# Patient Record
Sex: Female | Born: 1935 | Race: Black or African American | Hispanic: No | Marital: Single | Smoking: Former smoker
Health system: Southern US, Community
[De-identification: ages and names within clinical notes are randomized; demographics above are authoritative.]

## PROBLEM LIST (undated history)

## (undated) DIAGNOSIS — I10 Essential (primary) hypertension: Secondary | ICD-10-CM

## (undated) DIAGNOSIS — E785 Hyperlipidemia, unspecified: Secondary | ICD-10-CM

## (undated) DIAGNOSIS — R0602 Shortness of breath: Secondary | ICD-10-CM

## (undated) DIAGNOSIS — F329 Major depressive disorder, single episode, unspecified: Secondary | ICD-10-CM

## (undated) DIAGNOSIS — I8390 Asymptomatic varicose veins of unspecified lower extremity: Secondary | ICD-10-CM

## (undated) DIAGNOSIS — F419 Anxiety disorder, unspecified: Secondary | ICD-10-CM

## (undated) DIAGNOSIS — K219 Gastro-esophageal reflux disease without esophagitis: Secondary | ICD-10-CM

## (undated) DIAGNOSIS — F32A Depression, unspecified: Secondary | ICD-10-CM

## (undated) DIAGNOSIS — M1990 Unspecified osteoarthritis, unspecified site: Secondary | ICD-10-CM

## (undated) DIAGNOSIS — E119 Type 2 diabetes mellitus without complications: Secondary | ICD-10-CM

## (undated) HISTORY — PX: APPENDECTOMY: SHX54

## (undated) HISTORY — PX: EYE SURGERY: SHX253

## (undated) HISTORY — DX: Type 2 diabetes mellitus without complications: E11.9

## (undated) HISTORY — DX: Essential (primary) hypertension: I10

## (undated) HISTORY — DX: Hyperlipidemia, unspecified: E78.5

## (undated) HISTORY — DX: Asymptomatic varicose veins of unspecified lower extremity: I83.90

## (undated) HISTORY — DX: Shortness of breath: R06.02

---

## 1999-08-09 ENCOUNTER — Encounter: Admission: RE | Admit: 1999-08-09 | Discharge: 1999-08-09 | Payer: Self-pay | Admitting: Internal Medicine

## 1999-09-05 ENCOUNTER — Emergency Department (HOSPITAL_COMMUNITY): Admission: EM | Admit: 1999-09-05 | Discharge: 1999-09-05 | Payer: Self-pay | Admitting: Emergency Medicine

## 2000-05-02 ENCOUNTER — Other Ambulatory Visit: Admission: RE | Admit: 2000-05-02 | Discharge: 2000-05-02 | Payer: Self-pay | Admitting: Obstetrics and Gynecology

## 2004-09-16 ENCOUNTER — Ambulatory Visit: Payer: Self-pay | Admitting: Internal Medicine

## 2004-10-01 ENCOUNTER — Ambulatory Visit: Payer: Self-pay | Admitting: Internal Medicine

## 2004-10-11 ENCOUNTER — Ambulatory Visit: Payer: Self-pay | Admitting: Internal Medicine

## 2004-10-20 ENCOUNTER — Ambulatory Visit: Payer: Self-pay | Admitting: Internal Medicine

## 2004-11-12 ENCOUNTER — Ambulatory Visit: Payer: Self-pay | Admitting: Internal Medicine

## 2004-12-18 ENCOUNTER — Emergency Department (HOSPITAL_COMMUNITY): Admission: EM | Admit: 2004-12-18 | Discharge: 2004-12-18 | Payer: Self-pay | Admitting: Emergency Medicine

## 2005-01-11 ENCOUNTER — Ambulatory Visit: Payer: Self-pay | Admitting: Internal Medicine

## 2005-01-14 ENCOUNTER — Ambulatory Visit: Payer: Self-pay | Admitting: Cardiology

## 2005-02-09 ENCOUNTER — Ambulatory Visit: Payer: Self-pay | Admitting: Internal Medicine

## 2005-04-12 ENCOUNTER — Ambulatory Visit: Payer: Self-pay | Admitting: Internal Medicine

## 2005-04-27 ENCOUNTER — Ambulatory Visit: Payer: Self-pay | Admitting: Internal Medicine

## 2005-05-25 ENCOUNTER — Ambulatory Visit: Payer: Self-pay | Admitting: Internal Medicine

## 2005-06-23 ENCOUNTER — Ambulatory Visit: Payer: Self-pay | Admitting: Internal Medicine

## 2005-07-06 ENCOUNTER — Encounter: Admission: RE | Admit: 2005-07-06 | Discharge: 2005-07-06 | Payer: Self-pay | Admitting: Internal Medicine

## 2005-07-29 ENCOUNTER — Emergency Department (HOSPITAL_COMMUNITY): Admission: EM | Admit: 2005-07-29 | Discharge: 2005-07-29 | Payer: Self-pay | Admitting: Emergency Medicine

## 2005-08-02 ENCOUNTER — Ambulatory Visit: Payer: Self-pay | Admitting: Internal Medicine

## 2005-08-12 ENCOUNTER — Ambulatory Visit: Payer: Self-pay | Admitting: Internal Medicine

## 2005-08-17 ENCOUNTER — Ambulatory Visit: Payer: Self-pay

## 2005-08-31 ENCOUNTER — Ambulatory Visit: Payer: Self-pay | Admitting: Internal Medicine

## 2005-09-28 ENCOUNTER — Ambulatory Visit: Payer: Self-pay | Admitting: Pulmonary Disease

## 2005-12-13 ENCOUNTER — Ambulatory Visit: Payer: Self-pay | Admitting: Internal Medicine

## 2005-12-28 ENCOUNTER — Ambulatory Visit: Payer: Self-pay | Admitting: Internal Medicine

## 2006-03-09 ENCOUNTER — Ambulatory Visit: Payer: Self-pay | Admitting: Internal Medicine

## 2006-03-23 ENCOUNTER — Ambulatory Visit: Payer: Self-pay | Admitting: Internal Medicine

## 2006-04-20 ENCOUNTER — Ambulatory Visit: Payer: Self-pay | Admitting: Internal Medicine

## 2006-05-04 ENCOUNTER — Ambulatory Visit: Payer: Self-pay | Admitting: Internal Medicine

## 2006-05-05 ENCOUNTER — Ambulatory Visit: Payer: Self-pay | Admitting: Internal Medicine

## 2006-05-15 ENCOUNTER — Ambulatory Visit: Payer: Self-pay | Admitting: Pulmonary Disease

## 2006-05-16 ENCOUNTER — Ambulatory Visit: Payer: Self-pay | Admitting: Internal Medicine

## 2006-08-02 ENCOUNTER — Ambulatory Visit: Payer: Self-pay | Admitting: Internal Medicine

## 2006-08-02 LAB — CONVERTED CEMR LAB
ALT: 14 units/L (ref 0–40)
AST: 18 units/L (ref 0–37)
Albumin: 4 g/dL (ref 3.5–5.2)
Alkaline Phosphatase: 72 units/L (ref 39–117)
BUN: 16 mg/dL (ref 6–23)
Bacteria, U Microscopic: NEGATIVE /hpf
Basophils Absolute: 0 10*3/uL (ref 0.0–0.1)
Basophils Relative: 0.5 % (ref 0.0–1.0)
Bilirubin Urine: NEGATIVE
CO2: 29 meq/L (ref 19–32)
Calcium: 10 mg/dL (ref 8.4–10.5)
Chloride: 106 meq/L (ref 96–112)
Chol/HDL Ratio, serum: 4.6
Cholesterol: 231 mg/dL (ref 0–200)
Creatinine, Ser: 0.8 mg/dL (ref 0.4–1.2)
Crystals: NEGATIVE
Eosinophil percent: 2.4 % (ref 0.0–5.0)
GFR calc non Af Amer: 74 mL/min
Glomerular Filtration Rate, Af Am: 90 mL/min/{1.73_m2}
Glucose, Bld: 167 mg/dL — ABNORMAL HIGH (ref 70–99)
HCT: 42.3 % (ref 36.0–46.0)
HDL: 50.1 mg/dL (ref >39.0)
Hemoglobin, Urine: NEGATIVE
Hemoglobin: 13.9 g/dL (ref 12.0–15.0)
Ketones, ur: NEGATIVE mg/dL
LDL DIRECT: 161.2 mg/dL
Lymphocytes Relative: 36 % (ref 12.0–46.0)
MCHC: 32.7 g/dL (ref 30.0–36.0)
MCV: 86.2 fL (ref 78.0–100.0)
Monocytes Absolute: 0.5 10*3/uL (ref 0.2–0.7)
Monocytes Relative: 10.6 % (ref 3.0–11.0)
Mucus, UA: NEGATIVE
Neutro Abs: 2.7 10*3/uL (ref 1.4–7.7)
Neutrophils Relative %: 50.5 % (ref 43.0–77.0)
Nitrite: NEGATIVE
Platelets: 187 10*3/uL (ref 150–400)
Potassium: 5 meq/L (ref 3.5–5.1)
Pro B Natriuretic peptide (BNP): 31 pg/mL (ref 0.0–100.0)
RBC: 4.91 M/uL (ref 3.87–5.11)
RDW: 16.5 % — ABNORMAL HIGH (ref 11.5–14.6)
Sed Rate: 16 mm/hr (ref 0–25)
Sodium: 141 meq/L (ref 135–145)
Specific Gravity, Urine: 1.025 (ref 1.000–1.03)
TSH: 1.68 microintl units/mL (ref 0.35–5.50)
Total Bilirubin: 0.7 mg/dL (ref 0.3–1.2)
Total Protein, Urine: NEGATIVE mg/dL
Total Protein: 7.5 g/dL (ref 6.0–8.3)
Triglyceride fasting, serum: 88 mg/dL (ref 0–149)
Urine Glucose: NEGATIVE mg/dL
Urobilinogen, UA: 0.2 (ref 0.0–1.0)
VLDL: 18 mg/dL (ref 0–40)
WBC: 5.1 10*3/uL (ref 4.5–10.5)
pH: 5 (ref 5.0–8.0)

## 2006-08-16 ENCOUNTER — Ambulatory Visit: Payer: Self-pay | Admitting: Pulmonary Disease

## 2006-08-22 ENCOUNTER — Encounter: Admission: RE | Admit: 2006-08-22 | Discharge: 2006-11-20 | Payer: Self-pay | Admitting: Internal Medicine

## 2006-11-09 ENCOUNTER — Ambulatory Visit: Payer: Self-pay | Admitting: Internal Medicine

## 2006-11-09 LAB — CONVERTED CEMR LAB
BUN: 9 mg/dL (ref 6–23)
CO2: 28 meq/L (ref 19–32)
Calcium: 9.6 mg/dL (ref 8.4–10.5)
Chloride: 102 meq/L (ref 96–112)
GFR calc Af Amer: 105 mL/min
GFR calc non Af Amer: 87 mL/min

## 2007-02-13 ENCOUNTER — Ambulatory Visit: Payer: Self-pay | Admitting: Internal Medicine

## 2007-02-13 LAB — CONVERTED CEMR LAB
CO2: 27 meq/L (ref 19–32)
Chloride: 104 meq/L (ref 96–112)
Creatinine, Ser: 0.7 mg/dL (ref 0.4–1.2)
Eosinophils Relative: 3.8 % (ref 0.0–5.0)
Glucose, Bld: 172 mg/dL — ABNORMAL HIGH (ref 70–99)
HCT: 39.1 % (ref 36.0–46.0)
Hemoglobin: 13.4 g/dL (ref 12.0–15.0)
Neutrophils Relative %: 47.2 % (ref 43.0–77.0)
RBC: 4.59 M/uL (ref 3.87–5.11)
RDW: 13.9 % (ref 11.5–14.6)
Sodium: 139 meq/L (ref 135–145)
WBC: 5.9 10*3/uL (ref 4.5–10.5)

## 2007-02-27 ENCOUNTER — Ambulatory Visit: Payer: Self-pay | Admitting: Internal Medicine

## 2007-02-27 LAB — CONVERTED CEMR LAB: Hgb A1c MFr Bld: 10.4 % — ABNORMAL HIGH

## 2007-05-24 ENCOUNTER — Ambulatory Visit: Payer: Self-pay | Admitting: Internal Medicine

## 2007-05-24 LAB — CONVERTED CEMR LAB
CO2: 29 meq/L (ref 19–32)
Chloride: 105 meq/L (ref 96–112)
Creatinine, Ser: 0.8 mg/dL (ref 0.4–1.2)
Glucose, Bld: 163 mg/dL — ABNORMAL HIGH (ref 70–99)
Hgb A1c MFr Bld: 8.2 % — ABNORMAL HIGH (ref 4.6–6.0)
Sodium: 140 meq/L (ref 135–145)

## 2007-07-12 DIAGNOSIS — R05 Cough: Secondary | ICD-10-CM

## 2007-07-12 DIAGNOSIS — R059 Cough, unspecified: Secondary | ICD-10-CM | POA: Insufficient documentation

## 2007-07-12 DIAGNOSIS — M545 Low back pain: Secondary | ICD-10-CM

## 2007-07-12 DIAGNOSIS — F329 Major depressive disorder, single episode, unspecified: Secondary | ICD-10-CM

## 2007-07-12 DIAGNOSIS — R5381 Other malaise: Secondary | ICD-10-CM

## 2007-07-12 DIAGNOSIS — R5383 Other fatigue: Secondary | ICD-10-CM

## 2007-07-12 DIAGNOSIS — E785 Hyperlipidemia, unspecified: Secondary | ICD-10-CM | POA: Insufficient documentation

## 2007-07-12 DIAGNOSIS — L509 Urticaria, unspecified: Secondary | ICD-10-CM

## 2007-07-12 DIAGNOSIS — E1165 Type 2 diabetes mellitus with hyperglycemia: Secondary | ICD-10-CM

## 2007-07-12 DIAGNOSIS — I10 Essential (primary) hypertension: Secondary | ICD-10-CM

## 2007-08-09 ENCOUNTER — Ambulatory Visit: Payer: Self-pay | Admitting: Internal Medicine

## 2007-08-09 LAB — CONVERTED CEMR LAB
ALT: 16 units/L (ref 0–35)
AST: 17 units/L (ref 0–37)
Albumin: 4.1 g/dL (ref 3.5–5.2)
BUN: 10 mg/dL (ref 6–23)
Basophils Absolute: 0 10*3/uL (ref 0.0–0.1)
Calcium: 9.7 mg/dL (ref 8.4–10.5)
Chloride: 104 meq/L (ref 96–112)
Eosinophils Absolute: 0.3 10*3/uL (ref 0.0–0.6)
Eosinophils Relative: 4.5 % (ref 0.0–5.0)
GFR calc non Af Amer: 74 mL/min
Ketones, ur: NEGATIVE mg/dL
MCHC: 33.7 g/dL (ref 30.0–36.0)
MCV: 85.9 fL (ref 78.0–100.0)
Nitrite: NEGATIVE
Platelets: 181 10*3/uL (ref 150–400)
RBC: 4.67 M/uL (ref 3.87–5.11)
TSH: 2.1 microintl units/mL (ref 0.35–5.50)
Total CHOL/HDL Ratio: 5.4
Triglycerides: 178 mg/dL — ABNORMAL HIGH (ref 0–149)
Urine Glucose: NEGATIVE mg/dL
WBC: 6.2 10*3/uL (ref 4.5–10.5)

## 2007-08-10 ENCOUNTER — Telehealth: Payer: Self-pay | Admitting: Internal Medicine

## 2007-08-16 ENCOUNTER — Ambulatory Visit: Payer: Self-pay | Admitting: Internal Medicine

## 2007-09-05 ENCOUNTER — Telehealth (INDEPENDENT_AMBULATORY_CARE_PROVIDER_SITE_OTHER): Payer: Self-pay | Admitting: *Deleted

## 2007-09-07 ENCOUNTER — Ambulatory Visit: Payer: Self-pay | Admitting: Internal Medicine

## 2007-09-07 DIAGNOSIS — R42 Dizziness and giddiness: Secondary | ICD-10-CM

## 2007-09-07 DIAGNOSIS — J310 Chronic rhinitis: Secondary | ICD-10-CM | POA: Insufficient documentation

## 2007-09-10 LAB — CONVERTED CEMR LAB
ALT: 19 units/L (ref 0–35)
Albumin: 4.1 g/dL (ref 3.5–5.2)
Alkaline Phosphatase: 50 units/L (ref 39–117)
Cholesterol: 160 mg/dL (ref 0–200)
Creatinine, Ser: 0.8 mg/dL (ref 0.4–1.2)
HDL: 45.7 mg/dL (ref >39.0)
LDL Cholesterol: 96 mg/dL (ref 0–99)
Potassium: 4.8 meq/L (ref 3.5–5.1)
Total Bilirubin: 0.6 mg/dL (ref 0.3–1.2)
Total CHOL/HDL Ratio: 3.5

## 2007-09-13 ENCOUNTER — Telehealth (INDEPENDENT_AMBULATORY_CARE_PROVIDER_SITE_OTHER): Payer: Self-pay | Admitting: *Deleted

## 2007-09-28 ENCOUNTER — Ambulatory Visit: Payer: Self-pay | Admitting: Internal Medicine

## 2007-11-27 ENCOUNTER — Ambulatory Visit: Payer: Self-pay | Admitting: Internal Medicine

## 2007-11-28 LAB — CONVERTED CEMR LAB
Calcium: 10.1 mg/dL (ref 8.4–10.5)
Creatinine, Ser: 1 mg/dL (ref 0.4–1.2)
GFR calc non Af Amer: 57 mL/min
HDL: 37.8 mg/dL — ABNORMAL LOW (ref >39.0)
Hgb A1c MFr Bld: 8.6 % — ABNORMAL HIGH (ref 4.6–6.0)
LDL Cholesterol: 69 mg/dL (ref 0–99)
Sodium: 139 meq/L (ref 135–145)
Total Bilirubin: 0.4 mg/dL (ref 0.3–1.2)
Total CHOL/HDL Ratio: 3.3
Triglycerides: 98 mg/dL (ref 0–149)

## 2008-02-21 ENCOUNTER — Ambulatory Visit: Payer: Self-pay | Admitting: Internal Medicine

## 2008-02-22 LAB — CONVERTED CEMR LAB
ALT: 17 units/L (ref 0–35)
AST: 21 units/L (ref 0–37)
Alkaline Phosphatase: 50 units/L (ref 39–117)
Bilirubin, Direct: 0.1 mg/dL (ref 0.0–0.3)
CO2: 27 meq/L (ref 19–32)
Chloride: 105 meq/L (ref 96–112)
Potassium: 4.3 meq/L (ref 3.5–5.1)
Sodium: 140 meq/L (ref 135–145)
Total Bilirubin: 0.8 mg/dL (ref 0.3–1.2)

## 2008-03-19 ENCOUNTER — Encounter: Payer: Self-pay | Admitting: Internal Medicine

## 2008-03-21 ENCOUNTER — Ambulatory Visit: Payer: Self-pay | Admitting: Internal Medicine

## 2008-04-17 ENCOUNTER — Telehealth (INDEPENDENT_AMBULATORY_CARE_PROVIDER_SITE_OTHER): Payer: Self-pay | Admitting: *Deleted

## 2008-04-18 ENCOUNTER — Ambulatory Visit: Payer: Self-pay | Admitting: Internal Medicine

## 2008-04-18 DIAGNOSIS — N3000 Acute cystitis without hematuria: Secondary | ICD-10-CM | POA: Insufficient documentation

## 2008-05-02 ENCOUNTER — Ambulatory Visit: Payer: Self-pay | Admitting: Internal Medicine

## 2008-05-02 LAB — CONVERTED CEMR LAB
CO2: 28 meq/L (ref 19–32)
Calcium: 9.6 mg/dL (ref 8.4–10.5)
Creatinine, Ser: 1 mg/dL (ref 0.4–1.2)
Glucose, Bld: 124 mg/dL — ABNORMAL HIGH (ref 70–99)
Sodium: 141 meq/L (ref 135–145)

## 2008-05-13 ENCOUNTER — Ambulatory Visit: Payer: Self-pay | Admitting: Internal Medicine

## 2008-05-15 ENCOUNTER — Telehealth: Payer: Self-pay | Admitting: Internal Medicine

## 2008-05-26 ENCOUNTER — Encounter: Payer: Self-pay | Admitting: Internal Medicine

## 2008-05-27 ENCOUNTER — Ambulatory Visit: Payer: Self-pay | Admitting: Internal Medicine

## 2008-05-30 ENCOUNTER — Encounter: Payer: Self-pay | Admitting: Internal Medicine

## 2008-06-05 ENCOUNTER — Encounter: Payer: Self-pay | Admitting: Internal Medicine

## 2008-07-16 ENCOUNTER — Ambulatory Visit: Payer: Self-pay | Admitting: Internal Medicine

## 2008-07-16 ENCOUNTER — Telehealth (INDEPENDENT_AMBULATORY_CARE_PROVIDER_SITE_OTHER): Payer: Self-pay | Admitting: *Deleted

## 2008-07-16 LAB — CONVERTED CEMR LAB
Bilirubin Urine: NEGATIVE
Mucus, UA: NEGATIVE
Nitrite: POSITIVE — AB
Total Protein, Urine: 30 mg/dL — AB
pH: 5.5 (ref 5.0–8.0)

## 2008-08-28 ENCOUNTER — Ambulatory Visit: Payer: Self-pay | Admitting: Internal Medicine

## 2008-08-28 DIAGNOSIS — M2550 Pain in unspecified joint: Secondary | ICD-10-CM | POA: Insufficient documentation

## 2008-08-30 LAB — CONVERTED CEMR LAB
Albumin: 4.1 g/dL (ref 3.5–5.2)
Alkaline Phosphatase: 47 units/L (ref 39–117)
BUN: 22 mg/dL (ref 6–23)
Bacteria, UA: NEGATIVE
Basophils Absolute: 0 10*3/uL (ref 0.0–0.1)
Calcium: 9.9 mg/dL (ref 8.4–10.5)
Cholesterol: 182 mg/dL (ref 0–200)
Crystals: NEGATIVE
Eosinophils Absolute: 0.3 10*3/uL (ref 0.0–0.7)
GFR calc Af Amer: 69 mL/min
GFR calc non Af Amer: 57 mL/min
HDL: 48.5 mg/dL (ref >39.0)
Hemoglobin, Urine: NEGATIVE
Hgb A1c MFr Bld: 7.5 % — ABNORMAL HIGH (ref 4.6–6.0)
MCHC: 33.4 g/dL (ref 30.0–36.0)
MCV: 87.1 fL (ref 78.0–100.0)
Microalb Creat Ratio: 4.6 mg/g (ref 0.0–30.0)
Microalb, Ur: 0.6 mg/dL (ref 0.0–1.9)
Neutrophils Relative %: 44.3 % (ref 43.0–77.0)
Platelets: 175 10*3/uL (ref 150–400)
Potassium: 4.3 meq/L (ref 3.5–5.1)
RBC / HPF: NONE SEEN
RDW: 14.3 % (ref 11.5–14.6)
Sodium: 141 meq/L (ref 135–145)
Specific Gravity, Urine: 1.02 (ref 1.000–1.03)
Total Protein: 7.5 g/dL (ref 6.0–8.3)
Triglycerides: 111 mg/dL (ref 0–149)
Urobilinogen, UA: 0.2 (ref 0.0–1.0)
VLDL: 22 mg/dL (ref 0–40)

## 2008-09-10 ENCOUNTER — Encounter: Payer: Self-pay | Admitting: Internal Medicine

## 2008-09-15 ENCOUNTER — Telehealth (INDEPENDENT_AMBULATORY_CARE_PROVIDER_SITE_OTHER): Payer: Self-pay | Admitting: *Deleted

## 2008-09-15 ENCOUNTER — Ambulatory Visit: Payer: Self-pay | Admitting: Internal Medicine

## 2008-09-15 DIAGNOSIS — E559 Vitamin D deficiency, unspecified: Secondary | ICD-10-CM | POA: Insufficient documentation

## 2008-11-27 ENCOUNTER — Ambulatory Visit: Payer: Self-pay | Admitting: Internal Medicine

## 2008-11-28 LAB — CONVERTED CEMR LAB
CO2: 27 meq/L (ref 19–32)
Chloride: 103 meq/L (ref 96–112)
Potassium: 4.2 meq/L (ref 3.5–5.1)
Sodium: 138 meq/L (ref 135–145)
Total CHOL/HDL Ratio: 3
Triglycerides: 123 mg/dL (ref 0.0–149.0)

## 2009-01-06 ENCOUNTER — Ambulatory Visit: Payer: Self-pay | Admitting: Internal Medicine

## 2009-01-07 ENCOUNTER — Telehealth (INDEPENDENT_AMBULATORY_CARE_PROVIDER_SITE_OTHER): Payer: Self-pay | Admitting: *Deleted

## 2009-02-09 ENCOUNTER — Ambulatory Visit: Payer: Self-pay | Admitting: Internal Medicine

## 2009-02-10 DIAGNOSIS — J209 Acute bronchitis, unspecified: Secondary | ICD-10-CM

## 2009-02-27 ENCOUNTER — Ambulatory Visit: Payer: Self-pay | Admitting: Internal Medicine

## 2009-02-27 LAB — CONVERTED CEMR LAB
CO2: 28 meq/L (ref 19–32)
Chloride: 104 meq/L (ref 96–112)
Glucose, Bld: 153 mg/dL — ABNORMAL HIGH (ref 70–99)
HDL: 45.1 mg/dL (ref >39.00)
Hgb A1c MFr Bld: 8 % — ABNORMAL HIGH (ref 4.6–6.5)
Potassium: 3.8 meq/L (ref 3.5–5.1)
Sodium: 140 meq/L (ref 135–145)
Total CHOL/HDL Ratio: 3
Triglycerides: 99 mg/dL (ref 0.0–149.0)
VLDL: 19.8 mg/dL (ref 0.0–40.0)

## 2009-03-16 ENCOUNTER — Ambulatory Visit: Payer: Self-pay | Admitting: Internal Medicine

## 2009-03-16 DIAGNOSIS — K589 Irritable bowel syndrome without diarrhea: Secondary | ICD-10-CM | POA: Insufficient documentation

## 2009-04-20 ENCOUNTER — Ambulatory Visit: Payer: Self-pay | Admitting: Internal Medicine

## 2009-05-04 ENCOUNTER — Ambulatory Visit: Payer: Self-pay | Admitting: Internal Medicine

## 2009-06-02 ENCOUNTER — Ambulatory Visit: Payer: Self-pay | Admitting: Internal Medicine

## 2009-06-02 ENCOUNTER — Encounter: Payer: Self-pay | Admitting: Adult Health

## 2009-06-04 LAB — CONVERTED CEMR LAB
GFR calc non Af Amer: 68.88 mL/min (ref >60)
Glucose, Bld: 135 mg/dL — ABNORMAL HIGH (ref 70–99)

## 2009-07-09 ENCOUNTER — Encounter: Payer: Self-pay | Admitting: Internal Medicine

## 2009-08-18 ENCOUNTER — Ambulatory Visit: Payer: Self-pay | Admitting: Internal Medicine

## 2009-08-21 LAB — CONVERTED CEMR LAB
AST: 24 units/L (ref 0–37)
Albumin: 4.3 g/dL (ref 3.5–5.2)
Alkaline Phosphatase: 47 units/L (ref 39–117)
Basophils Relative: 0.8 % (ref 0.0–3.0)
Calcium: 10 mg/dL (ref 8.4–10.5)
Chloride: 105 meq/L (ref 96–112)
Cholesterol: 150 mg/dL (ref 0–200)
Creatinine, Ser: 1 mg/dL (ref 0.4–1.2)
Eosinophils Relative: 6.7 % — ABNORMAL HIGH (ref 0.0–5.0)
HDL: 47.9 mg/dL (ref >39.00)
LDL Cholesterol: 82 mg/dL (ref 0–99)
Lymphocytes Relative: 31.4 % (ref 12.0–46.0)
Neutrophils Relative %: 49.4 % (ref 43.0–77.0)
RBC: 4.5 M/uL (ref 3.87–5.11)
Total Protein: 7.8 g/dL (ref 6.0–8.3)
Triglycerides: 100 mg/dL (ref 0.0–149.0)
WBC: 6.5 10*3/uL (ref 4.5–10.5)

## 2009-10-12 ENCOUNTER — Telehealth (INDEPENDENT_AMBULATORY_CARE_PROVIDER_SITE_OTHER): Payer: Self-pay | Admitting: *Deleted

## 2009-10-12 ENCOUNTER — Ambulatory Visit: Payer: Self-pay | Admitting: Internal Medicine

## 2009-10-12 ENCOUNTER — Encounter: Payer: Self-pay | Admitting: Adult Health

## 2009-10-14 LAB — CONVERTED CEMR LAB
Specific Gravity, Urine: 1.01 (ref 1.000–1.030)
Urine Glucose: NEGATIVE mg/dL

## 2009-11-18 ENCOUNTER — Ambulatory Visit: Payer: Self-pay | Admitting: Internal Medicine

## 2009-11-18 LAB — CONVERTED CEMR LAB
ALT: 15 units/L (ref 0–35)
Albumin: 4.1 g/dL (ref 3.5–5.2)
BUN: 20 mg/dL (ref 6–23)
Basophils Relative: 0.8 % (ref 0.0–3.0)
CRP, High Sensitivity: 0.7 (ref 0.00–5.00)
Chloride: 106 meq/L (ref 96–112)
Cholesterol: 151 mg/dL (ref 0–200)
Eosinophils Relative: 4.5 % (ref 0.0–5.0)
Glucose, Bld: 128 mg/dL — ABNORMAL HIGH (ref 70–99)
HCT: 39.5 % (ref 36.0–46.0)
Ketones, ur: NEGATIVE mg/dL
LDL Cholesterol: 76 mg/dL (ref 0–99)
Lymphs Abs: 1.7 10*3/uL (ref 0.7–4.0)
MCV: 88.3 fL (ref 78.0–100.0)
Monocytes Absolute: 0.7 10*3/uL (ref 0.1–1.0)
Neutro Abs: 3.5 10*3/uL (ref 1.4–7.7)
Platelets: 144 10*3/uL — ABNORMAL LOW (ref 150.0–400.0)
Potassium: 3.8 meq/L (ref 3.5–5.1)
Specific Gravity, Urine: >=1.03 (ref 1.000–1.030)
TSH: 2.42 microintl units/mL (ref 0.35–5.50)
Total Bilirubin: 0.2 mg/dL — ABNORMAL LOW (ref 0.3–1.2)
Total Protein, Urine: NEGATIVE mg/dL
Total Protein: 7.3 g/dL (ref 6.0–8.3)
Urine Glucose: NEGATIVE mg/dL
WBC: 6.2 10*3/uL (ref 4.5–10.5)
pH: 5 (ref 5.0–8.0)

## 2009-11-19 LAB — CONVERTED CEMR LAB: Vit D, 25-Hydroxy: 40 ng/mL (ref 30–89)

## 2010-01-25 ENCOUNTER — Telehealth: Payer: Self-pay | Admitting: Adult Health

## 2010-01-25 ENCOUNTER — Ambulatory Visit: Payer: Self-pay | Admitting: Adult Health

## 2010-01-25 ENCOUNTER — Ambulatory Visit: Payer: Self-pay | Admitting: Internal Medicine

## 2010-01-25 DIAGNOSIS — R300 Dysuria: Secondary | ICD-10-CM | POA: Insufficient documentation

## 2010-01-25 LAB — CONVERTED CEMR LAB
Specific Gravity, Urine: 1.02 (ref 1.000–1.030)
Urobilinogen, UA: 0.2 (ref 0.0–1.0)

## 2010-01-26 ENCOUNTER — Encounter: Payer: Self-pay | Admitting: Adult Health

## 2010-02-17 ENCOUNTER — Ambulatory Visit: Payer: Self-pay | Admitting: Internal Medicine

## 2010-02-17 LAB — CONVERTED CEMR LAB
Albumin: 4.5 g/dL (ref 3.5–5.2)
Alkaline Phosphatase: 51 units/L (ref 39–117)
Bilirubin, Direct: 0.1 mg/dL (ref 0.0–0.3)
CO2: 30 meq/L (ref 19–32)
Chloride: 104 meq/L (ref 96–112)
Creatinine, Ser: 1 mg/dL (ref 0.4–1.2)
Hgb A1c MFr Bld: 7.7 % — ABNORMAL HIGH (ref 4.6–6.5)
LDL Cholesterol: 115 mg/dL — ABNORMAL HIGH (ref 0–99)
Potassium: 4.6 meq/L (ref 3.5–5.1)
Total CHOL/HDL Ratio: 4
Total Protein: 7.3 g/dL (ref 6.0–8.3)
Triglycerides: 132 mg/dL (ref 0.0–149.0)
VLDL: 26.4 mg/dL (ref 0.0–40.0)

## 2010-04-06 ENCOUNTER — Encounter: Payer: Self-pay | Admitting: Internal Medicine

## 2010-05-20 ENCOUNTER — Ambulatory Visit: Payer: Self-pay | Admitting: Internal Medicine

## 2010-05-20 LAB — CONVERTED CEMR LAB
CO2: 31 meq/L (ref 19–32)
Calcium: 10.2 mg/dL (ref 8.4–10.5)
Chloride: 105 meq/L (ref 96–112)
Potassium: 4.3 meq/L (ref 3.5–5.1)
Sodium: 141 meq/L (ref 135–145)

## 2010-06-25 IMAGING — CR DG CHEST 2V
2 series · 2 of 2 positions shown · non-contrast
Comparison: Chest x-ray of 04/20/2009

CLINICAL DATA: For complete physical exam, former smoker

CHEST - 2 VIEW

[view not recorded (1 of 2)]
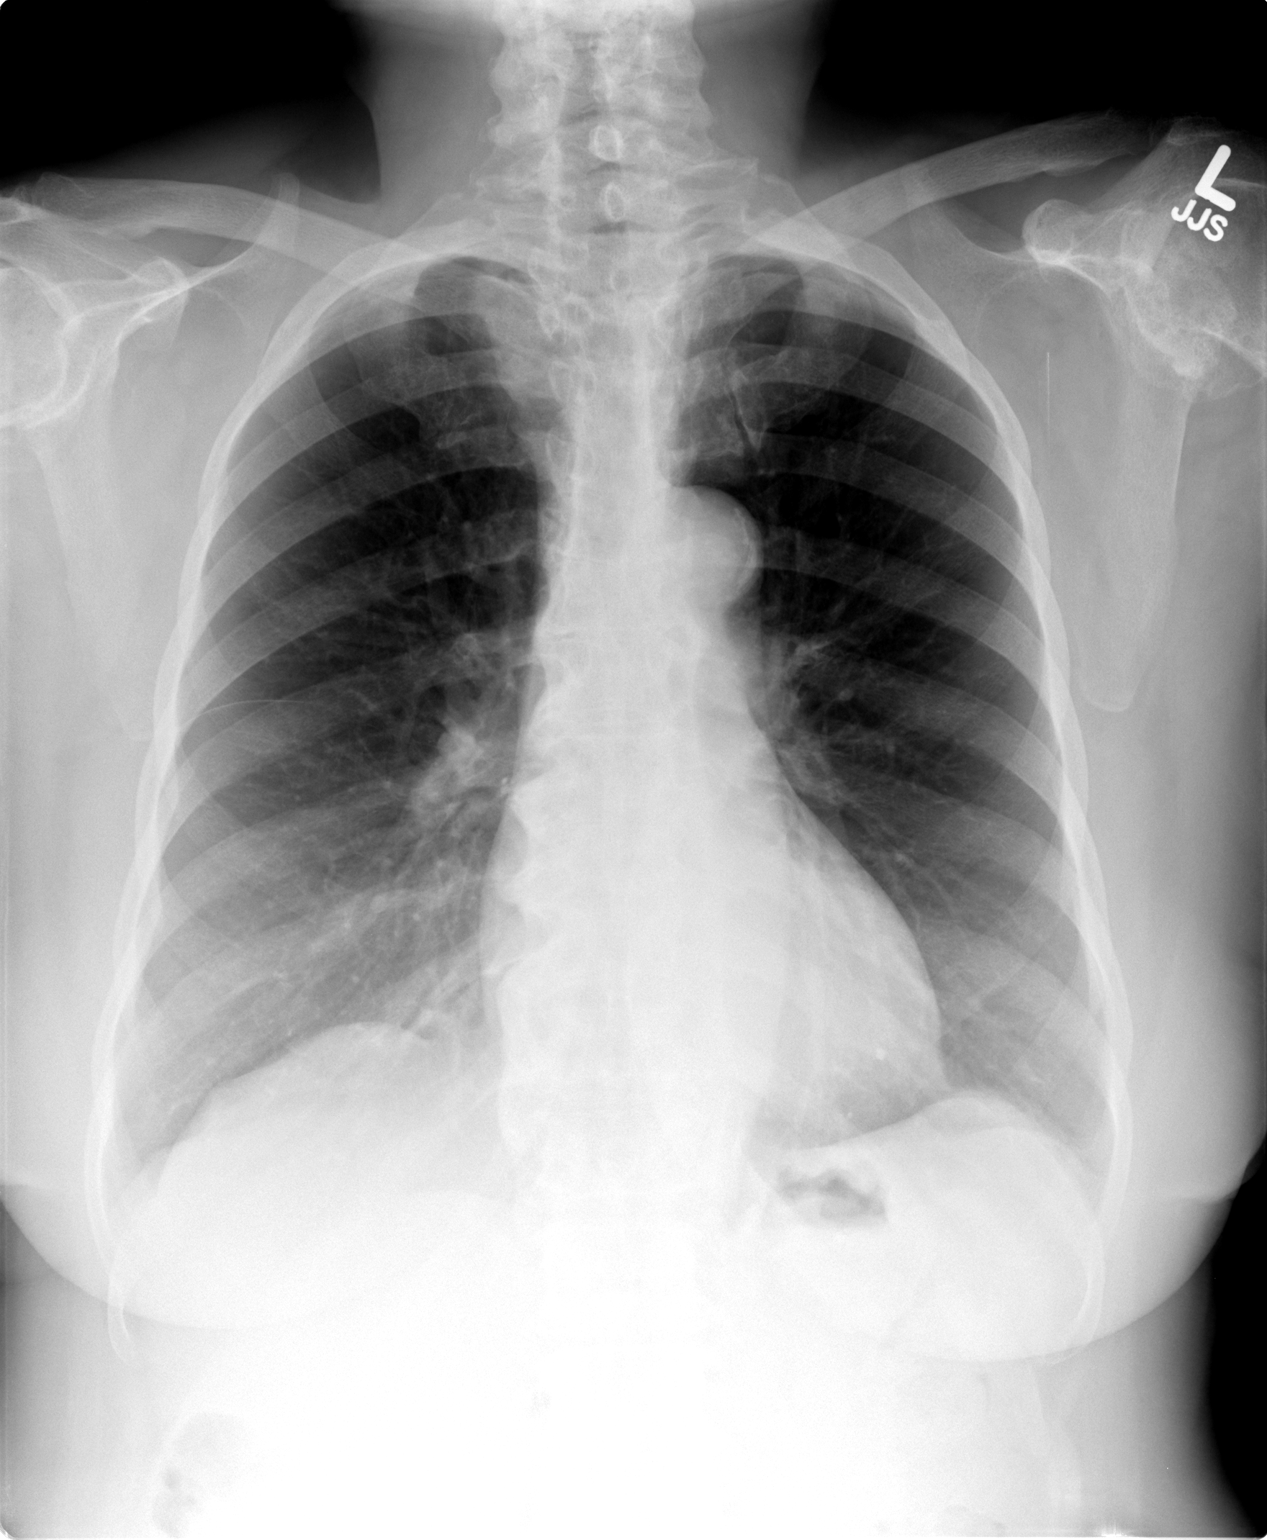

[view not recorded (2 of 2)]
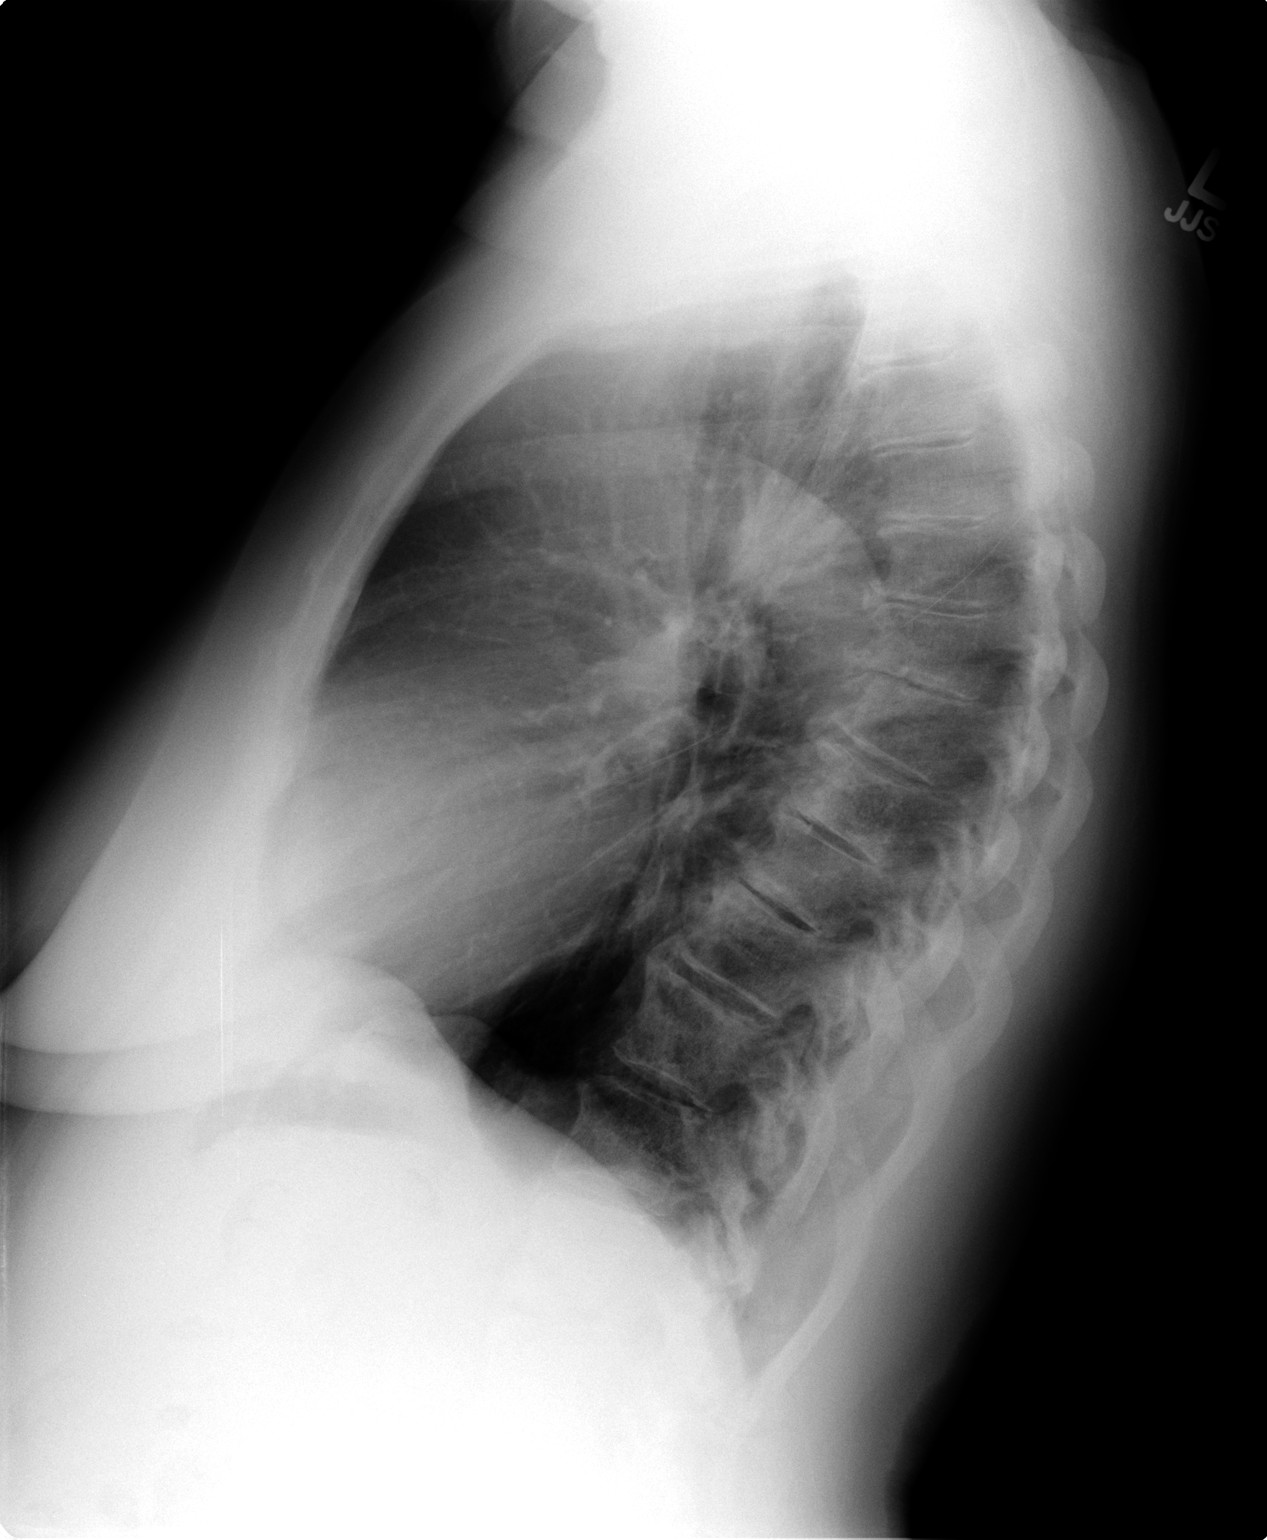

[2 of 2 positions shown; findings below may reference images not displayed]

FINDINGS: The lungs are clear.  Mediastinal contours are stable.
The heart is within normal limits in size.  There are degenerative
changes diffusely throughout the thoracic spine.
IMPRESSION: Stable chest x-ray.  No active lung disease.

## 2010-07-13 ENCOUNTER — Ambulatory Visit: Payer: Self-pay | Admitting: Internal Medicine

## 2010-07-13 LAB — CONVERTED CEMR LAB
BUN: 27 mg/dL — ABNORMAL HIGH (ref 6–23)
Chloride: 103 meq/L (ref 96–112)
Cholesterol: 168 mg/dL (ref 0–200)
Creatinine, Ser: 1 mg/dL (ref 0.4–1.2)
Eosinophils Absolute: 0.3 10*3/uL (ref 0.0–0.7)
Eosinophils Relative: 4.3 % (ref 0.0–5.0)
HDL: 49.1 mg/dL (ref >39.00)
Hgb A1c MFr Bld: 8.2 % — ABNORMAL HIGH (ref 4.6–6.5)
LDL Cholesterol: 100 mg/dL — ABNORMAL HIGH (ref 0–99)
MCHC: 33.5 g/dL (ref 30.0–36.0)
MCV: 86.9 fL (ref 78.0–100.0)
Monocytes Absolute: 0.6 10*3/uL (ref 0.1–1.0)
Neutrophils Relative %: 57.9 % (ref 43.0–77.0)
Platelets: 158 10*3/uL (ref 150.0–400.0)
Triglycerides: 95 mg/dL (ref 0.0–149.0)
WBC: 6.2 10*3/uL (ref 4.5–10.5)

## 2010-07-14 ENCOUNTER — Encounter: Payer: Self-pay | Admitting: Internal Medicine

## 2010-07-19 ENCOUNTER — Telehealth (INDEPENDENT_AMBULATORY_CARE_PROVIDER_SITE_OTHER): Payer: Self-pay | Admitting: *Deleted

## 2010-07-21 ENCOUNTER — Ambulatory Visit: Payer: Self-pay | Admitting: Internal Medicine

## 2010-07-26 ENCOUNTER — Encounter: Payer: Self-pay | Admitting: Internal Medicine

## 2010-07-28 ENCOUNTER — Encounter: Payer: Self-pay | Admitting: Internal Medicine

## 2010-09-11 ENCOUNTER — Encounter: Payer: Self-pay | Admitting: Pulmonary Disease

## 2010-09-15 ENCOUNTER — Telehealth (INDEPENDENT_AMBULATORY_CARE_PROVIDER_SITE_OTHER): Payer: Self-pay | Admitting: *Deleted

## 2010-09-16 ENCOUNTER — Ambulatory Visit
Admission: RE | Admit: 2010-09-16 | Discharge: 2010-09-16 | Payer: Self-pay | Source: Home / Self Care | Attending: Adult Health | Admitting: Adult Health

## 2010-09-16 ENCOUNTER — Encounter: Payer: Self-pay | Admitting: Adult Health

## 2010-09-16 ENCOUNTER — Other Ambulatory Visit: Payer: Self-pay | Admitting: Adult Health

## 2010-09-16 LAB — URINALYSIS, ROUTINE W REFLEX MICROSCOPIC
Bilirubin Urine: NEGATIVE
Ketones, ur: NEGATIVE
Nitrite: NEGATIVE
Specific Gravity, Urine: 1.025 (ref 1.000–1.030)
Total Protein, Urine: NEGATIVE
Urine Glucose: NEGATIVE
Urobilinogen, UA: 0.2 (ref 0.0–1.0)
pH: 5 (ref 5.0–8.0)

## 2010-10-05 NOTE — Assessment & Plan Note (Signed)
Summary: Acute NP office visit - UTI   Primary Provider/Referring Provider:  Sherene Sires  CC:  UTI:  increased urinary frequency/urgency and back discomfort x2days - denies f/c/s.  History of Present Illness: 18 yobf quit smoking  1987 with obesity/ aodm  with multiple chronic complaints and documented rhinitis with inconsistent compliance with nasal steroids   August 28, 2008 cpx chronic nasal congestion, non compliant with nasal steroids, but no longer using afrin either. zyrtec works to her satisfaction on as needed basis.  --cxr nml, vit d low at 15-rx vit d 50k 2x/wk, A1C 7.5 , trending down slowly so no change in rx.  November 27, 2008 ov co nose runs and stuffy when go outside no purulent secretions or overt hypo/hyperglycemia c/o's. Better with Zyrtec when remembers to take it. no cough or sob.     February 09, 2009--ov c/o chest congestion and coughing up "cloudy colored" sputum. Pt states she has difficulty getting sputum up.  In retrospect coughing for years. ugmentin 875mg  two times a day for  7 days w/ food 2)  Mucinex DM two times a day as needed cough/congestion 3)  Phenergan w/ codeine 1 tsp every 4-6 hr as needed cough/congestion  February 27, 2009 ov confused with med instructions with just a little bit of lingering cough but much better, instructed to do med reconciliation, never done  April 20, 2009 c/o daily cough x years, varies some but no pattern. not following med calendar provided, esp ppi, which uses as needed heartburn. rec two times a day and chlortrimeton but only needed once.  May 04, 2009 ov all better. broughts pills and calendar and also pill boxes which don't match up. Pt denies any significant sore throat, nasal congestion or excess secretions, fever, chills, sweats, unintended wt loss, pleuritic or exertional cp, orthopnea pnd or leg swelling.  Pt also denies any obvious fluctuation in symptoms with weather or environmental change or other alleviating or aggravating  factors.       June 02, 2009.  ov for a med. calendar otherwise doing well.  Last visit pt brought pills and calendar and pill boxes but didn't match. rec follow the calendar provided  December 14, 3 month followup.  Still with sensation of pnds, not sure prilosec helping. chlortrimeton helping but makes drowsy . Pt denies any significant sore throat, dysphagia, itching, sneezing,  nasal congestion or excess secretions,  fever, chills, sweats, unintended wt loss, pleuritic or exertional cp, hempoptysis, change in activity tolerance  orthopnea pnd or leg swelling.  Pt also denies any obvious fluctuation in symptoms with weather or environmental change or other alleviating or aggravating factors.    October 12, 2009--Presents for an acute office visit. Complains   increased urinary frequency/urgency, back discomfort x2days - denies f/c/s last uti 2 yrs ago. Feels like last UTI. Denies chest pain, dyspnea, orthopnea, hemoptysis, fever, n/v/d, edema, headache,recent travel or antibiotics, hematuria or abd pain.      Medications Prior to Update: 1)  Zoloft 100 Mg  Tabs (Sertraline Hcl) .... Take 1 and 1/2 Tabs By Mouth At Bedtime 2)  Bayer Low Strength 81 Mg  Tbec (Aspirin) .... Take One Tab By Mouth Once Daily 3)  Metformin Hcl 500 Mg  Tabs (Metformin Hcl) .... One Tablet Twice Daily 4)  Centrum Silver   Tabs (Multiple Vitamins-Minerals) .... Take One Tab By Mouth Once Daily 5)  Diovan Hct 160-12.5 Mg  Tabs (Valsartan-Hydrochlorothiazide) .... One By Mouth Daily 6)  Vitamin D  1000 Unit Tabs (Cholecalciferol) .... Take 1 Tablet By Mouth Once A Day 7)  Januvia 100 Mg Tabs (Sitagliptin Phosphate) .... Take 1 Tablet By Mouth Once A Day 8)  Simvastatin 40 Mg  Tabs (Simvastatin) .... One At Bedtime 9)  Amaryl 2 Mg Tabs (Glimepiride) .Marland Kitchen.. 1 By Mouth Once Daily With A Meal 10)  Calcium Carbonate-Vitamin D 600-400 Mg-Unit  Tabs (Calcium Carbonate-Vitamin D) .... Take 1 Tablet By Mouth Two Times A  Day 11)  Omeprazole 20 Mg Cpdr (Omeprazole) .... Take One 30-60 Min Before First and Last Meals of The Day 12)  Aleve 220 Mg  Tabs (Naproxen Sodium) .... Take As Directed With Food As Needed 13)  Chlor-Trimeton 4 Mg Tabs (Chlorpheniramine Maleate) .... Per Bottle When Needed 14)  Meclizine Hcl 25 Mg Tabs (Meclizine Hcl) .Marland Kitchen.. 1 Every 4 Hours As Needed 15)  Mucinex Dm 30-600 Mg  Tb12 (Dextromethorphan-Guaifenesin) .Marland Kitchen.. 1-2 Every 12 Hours As Needed 16)  Hydroxyzine Hcl 50 Mg Tabs (Hydroxyzine Hcl) .Marland Kitchen.. 1 Every 4 Hours As Needed 17)  Tylenol Extra Strength 500 Mg  Tabs (Acetaminophen) .... 2 Every 4-6 Hours As Needed 18)  Vicodin 5-500 Mg  Tabs (Hydrocodone-Acetaminophen) .... Take 1 Every 4 Hours As Needed 19)  Levsin 0.125 Mg Tabs (Hyoscyamine Sulfate) .Marland Kitchen.. 1 By Mouth Every 4-6 Hr As Needed Stomach Cramps, Bloating 20)  Gas-X Extra Strength 125 Mg Caps (Simethicone) .Marland Kitchen.. 1 With Each Meal As Needed 21)  Freestyle Lite Test  Strp (Glucose Blood) .... Test Bs Daily 22)  Freestyle Control Solution  Liqd (Blood Glucose Calibration) .... Use As Directed 23)  Autolet Lancing Device  Misc (Lancet Devices) .... Use A Directed 24)  Unilet Lancet  Misc (Lancets) .... Use As Directed  Current Medications (verified): 1)  Zoloft 100 Mg  Tabs (Sertraline Hcl) .... Take 1 and 1/2 Tabs By Mouth At Bedtime 2)  Bayer Low Strength 81 Mg  Tbec (Aspirin) .... Take One Tab By Mouth Once Daily 3)  Metformin Hcl 500 Mg  Tabs (Metformin Hcl) .... One Tablet Twice Daily 4)  Centrum Silver   Tabs (Multiple Vitamins-Minerals) .... Take One Tab By Mouth Once Daily 5)  Diovan Hct 160-12.5 Mg  Tabs (Valsartan-Hydrochlorothiazide) .... One By Mouth Daily 6)  Vitamin D 1000 Unit Tabs (Cholecalciferol) .... Take 1 Tablet By Mouth Once A Day 7)  Januvia 100 Mg Tabs (Sitagliptin Phosphate) .... Take 1 Tablet By Mouth Once A Day 8)  Simvastatin 40 Mg  Tabs (Simvastatin) .... One At Bedtime 9)  Amaryl 2 Mg Tabs (Glimepiride) .Marland Kitchen.. 1  By Mouth Once Daily With A Meal 10)  Calcium Carbonate-Vitamin D 600-400 Mg-Unit  Tabs (Calcium Carbonate-Vitamin D) .... Take 1 Tablet By Mouth Two Times A Day 11)  Omeprazole 20 Mg Cpdr (Omeprazole) .... Take One 30-60 Min Before First and Last Meals of The Day 12)  Aleve 220 Mg  Tabs (Naproxen Sodium) .... Take As Directed With Food As Needed 13)  Chlor-Trimeton 4 Mg Tabs (Chlorpheniramine Maleate) .... Per Bottle When Needed 14)  Meclizine Hcl 25 Mg Tabs (Meclizine Hcl) .Marland Kitchen.. 1 Every 4 Hours As Needed 15)  Mucinex Dm 30-600 Mg  Tb12 (Dextromethorphan-Guaifenesin) .Marland Kitchen.. 1-2 Every 12 Hours As Needed 16)  Hydroxyzine Hcl 50 Mg Tabs (Hydroxyzine Hcl) .Marland Kitchen.. 1 Every 4 Hours As Needed 17)  Tylenol Extra Strength 500 Mg  Tabs (Acetaminophen) .... 2 Every 4-6 Hours As Needed 18)  Vicodin 5-500 Mg  Tabs (Hydrocodone-Acetaminophen) .... Take 1 Every 4 Hours  As Needed 19)  Levsin 0.125 Mg Tabs (Hyoscyamine Sulfate) .Marland Kitchen.. 1 By Mouth Every 4-6 Hr As Needed Stomach Cramps, Bloating 20)  Gas-X Extra Strength 125 Mg Caps (Simethicone) .Marland Kitchen.. 1 With Each Meal As Needed 21)  Freestyle Lite Test  Strp (Glucose Blood) .... Test Bs Daily 22)  Freestyle Control Solution  Liqd (Blood Glucose Calibration) .... Use As Directed 23)  Autolet Lancing Device  Misc (Lancet Devices) .... Use A Directed 24)  Unilet Lancet  Misc (Lancets) .... Use As Directed  Allergies (verified): 1)  * Albuterol  Past History:  Past Medical History: Last updated: 08/18/2009 HYPERTENSION (ICD-401.9) HYPERLIPIDEMIA (ICD-272.4)     - target --LDL <70 (DM) due to DM  DM (ICD-250.00)  CHRONIC COUGH    - onset 1990's, almost daly since> resolved May 04, 2009 with ppi two times a day and chlortrimeton    - denied improved on ppi so  try off effective August 19, 2009  CHRONIC RHINITIS (ICD-472.0)-non adherent with nasal steroids      -Ct sinus nml (08/1999, 01/2005) Hx of URTICARIA (ICD-708.9) LUMBAR RADICULOPATHY, RIGHT  (ICD-724.4) DEPRESSION (ICD-311) FATIGUE, CHRONIC (ICD-780.79) MORBID OBESITY (ICD-278.01)      - Target wt  =  158  for BMI < 30  VIT D DEFICIENCY dx 08/28/08      - Rx with 50 k biw x 12weeks 09/01/08> Osteopenia      -BMD 04/2000 -nml (-0.4 hip)      -BMD 09/15/08    T spine 3.3,   L Fem -1.0,  R Fem - 1.0 HEALTH MAINENANCE...................................................................................Marland KitchenWert     -Td (1/06)     -Pnuemovax (1998), 05/02/2008     -Mammogram (9/08)-nml      -GYN........................................................................................................Marland KitchenMarcelle Overlie     -CPX  August 28, 2008      -refuses flu shot August 18, 2009   COMPLEX MED REGIMEN    -- med calendar completed/adjusted September 15, 2008 > lost it February 27, 2009  > rewrote without bringing in bottles., redone June 02, 2009     Family History: Last updated: 05/02/2008 mother living at 46, hx of CVA, DM, allergies father died at 7, hx of heart disease sibling 1 died at 60, had DM and cancer sibling 2 healthy aunt had DM  Social History: Last updated: 08/18/2009 Patient states former smoker.  Quit 1987 exercises 1-2 times a week no caffiene quit ETOH 1952 divrced/widowed 6 children M[facility] app[facility] 06/[facility]  Risk Factors: Smoking Status: quit (08/16/2007)  Review of Systems      See HPI  Vital Signs:  Patient profile:   77 year old female Height:      62.5 inches Weight:      151.25 pounds BMI:     27.32 O2 Sat:      95 % on Room air Temp:     97.3 degrees F oral Pulse rate:   87 / minute BP sitting:   146 / 72  (left arm) Cuff size:   regular  Vitals Entered By: Boone Master CNA (October 12, 2009 4:24 PM)  O2 Flow:  Room air CC: UTI:  increased urinary frequency/urgency, back discomfort x2days - denies f/c/s Is Patient Diabetic? Yes Comments Medications reviewed with patient Daytime contact number verified with  patient. Boone Master CNA  October 12, 2009 4:30 PM    Physical Exam  Additional Exam:  wt 153 > 152 August 18, 2009 >>151 2/7  GEN: A/Ox3; pleasant ,  NAD HEENT:  Edentulous, [place]/AT, , EACs-clear, TMs-wnl, NOSE-clear, THROAT-clear NECK:  Supple w/ fair ROM; no JVD; normal carotid impulses w/o bruits; no thyromegaly or nodules palpated; no lymphadenopathy. RESP  Diminshed but Clear to P & A; w/o, wheezes/ rales/ or rhonchi. CARD:  RRR, no m/r/g   GI:   Soft & nt; nml bowel sounds; no organomegaly or masses detected., neg CVA tenderness no guarding or rebound.  Musco: Warm bil,  no calf tenderness edema, clubbing, pulses intact    Impression & Recommendations:  Problem # 1:  CYSTITIS, ACUTE (ICD-595.0)  ua/urine cx pending.  REC  urinary hygiene measures discussed  Cipro 250mg  two times a day w/ food for 7 days Increase fluids.  Pyridium 100mg  three times a day as needed for pain w/ urination Please contact office for sooner follow up if symptoms do not improve or worsen  Her updated medication list for this problem includes:    Cipro 250 Mg Tabs (Ciprofloxacin hcl) .Marland Kitchen... 1 by mouth two times a day    Pyridium 100 Mg Tabs (Phenazopyridine hcl) .Marland Kitchen... 1 by mouth three times a day as needed burning w/ urination  Encouraged to push clear liquids, get enough rest, and take acetaminophen as needed. To be seen in 10 days if no improvement, sooner if worse.  Orders: T-Urine Culture (Spectrum Order) [id number]) TLB-Udip w/ Micro (81001-URINE) Prescription Created Electronically [id number]) Est. Patient Level IV (91478)  Medications Added to Medication List This Visit: 1)  Cipro 250 Mg Tabs (Ciprofloxacin hcl) .Marland Kitchen.. 1 by mouth two times a day 2)  Pyridium 100 Mg Tabs (Phenazopyridine hcl) .Marland Kitchen.. 1 by mouth three times a day as needed burning w/ urination  Complete Medication List: 1)  Zoloft 100 Mg Tabs (Sertraline hcl) .... Take 1 and 1/2 tabs by mouth at bedtime 2)  Bayer Low  Strength 81 Mg Tbec (Aspirin) .... Take one tab by mouth once daily 3)  Metformin Hcl 500 Mg Tabs (Metformin hcl) .... One tablet twice daily 4)  Centrum Silver Tabs (Multiple vitamins-minerals) .... Take one tab by mouth once daily 5)  Diovan Hct 160-12.5 Mg Tabs (Valsartan-hydrochlorothiazide) .... One by mouth daily 6)  Vitamin D 1000 Unit Tabs (Cholecalciferol) .... Take 1 tablet by mouth once a day 7)  Januvia 100 Mg Tabs (Sitagliptin phosphate) .... Take 1 tablet by mouth once a day 8)  Simvastatin 40 Mg Tabs (Simvastatin) .... One at bedtime 9)  Amaryl 2 Mg Tabs (Glimepiride) .Marland Kitchen.. 1 by mouth once daily with a meal 10)  Calcium Carbonate-vitamin D 600-400 Mg-unit Tabs (Calcium carbonate-vitamin d) .... Take 1 tablet by mouth two times a day 11)  Omeprazole 20 Mg Cpdr (Omeprazole) .... Take one 30-60 min before first and last meals of the day 12)  Aleve 220 Mg Tabs (Naproxen sodium) .... Take as directed with food as needed 13)  Chlor-trimeton 4 Mg Tabs (Chlorpheniramine maleate) .... Per bottle when needed 14)  Meclizine Hcl 25 Mg Tabs (Meclizine hcl) .Marland Kitchen.. 1 every 4 hours as needed 15)  Mucinex Dm 30-600 Mg Tb12 (Dextromethorphan-guaifenesin) .Marland Kitchen.. 1-2 every 12 hours as needed 16)  Hydroxyzine Hcl 50 Mg Tabs (Hydroxyzine hcl) .Marland Kitchen.. 1 every 4 hours as needed 17)  Tylenol Extra Strength 500 Mg Tabs (Acetaminophen) .... 2 every 4-6 hours as needed 18)  Vicodin 5-500 Mg Tabs (Hydrocodone-acetaminophen) .... Take 1 every 4 hours as needed 19)  Levsin 0.125 Mg Tabs (Hyoscyamine sulfate) .Marland Kitchen.. 1 by mouth every 4-6 hr as needed stomach cramps, bloating  20)  Gas-x Extra Strength 125 Mg Caps (Simethicone) .Marland KitchenMarland Kitchen. 1 with each meal as needed 21)  Freestyle Lite Test Strp (Glucose blood) .... Test bs daily 22)  Freestyle Control Solution Liqd (Blood glucose calibration) .... Use as directed 23)  Autolet Lancing Device Misc (Lancet devices) .... Use a directed 24)  Unilet Lancet Misc (Lancets) .... Use as  directed 25)  Cipro 250 Mg Tabs (Ciprofloxacin hcl) .Marland Kitchen.. 1 by mouth two times a day 26)  Pyridium 100 Mg Tabs (Phenazopyridine hcl) .Marland Kitchen.. 1 by mouth three times a day as needed burning w/ urination  Patient Instructions: 1)  Cipro 250mg  two times a day w/ food for 7 days 2)  Increase fluids.  3)  Pyridium 100mg  three times a day as needed for pain w/ urination 4)  Please contact office for sooner follow up if symptoms do not improve or worsen  Prescriptions: PYRIDIUM 100 MG TABS (PHENAZOPYRIDINE HCL) 1 by mouth three times a day as needed burning w/ urination  #6 x 0   Entered and Authorized by:   Rubye Oaks NP   Signed by:   Rubye Oaks NP on 10/12/2009   Method used:   Electronically to        [facility] [phone][facility] (retail)       [address] [place]  [address]       Ph: [phone] or [phone]       Fax: [phone]   RxID:   [id number] CIPRO 250 MG TABS (CIPROFLOXACIN HCL) 1 by mouth two times a day  #14 x 0   Entered and Authorized by:   Rubye Oaks NP   Signed by:   Rubye Oaks NP on 10/12/2009   Method used:   [facility]ronically to        [facility] [phone]* (retail)       [address]       [place], [place]  [address]       Ph: [phone] or [phone]       Fax: [phone]   RxID:   [id number]

## 2010-10-05 NOTE — Medication Information (Signed)
Summary: [facility]                        [facility]                                          Imported By: Lennie Odor 07/21/2010 15:31:08  _____________________________________________________________________  External Attachment:    Type:   Image     Comment:   External Document

## 2010-10-05 NOTE — Progress Notes (Signed)
Summary: rx  Phone Note Call from Patient Call back at Home Phone [phone]   Caller: Patient Call For: wert Reason for Call: Talk to Nurse Summary of Call: Pt has UTI - can she get something called in? [facility] Initial call taken by: Eugene Gavia,  October 12, 2009 11:30 AM  Follow-up for Phone Call        scheduled pt to see tp today at 4:15 Follow-up by: Philipp Deputy CMA,  October 12, 2009 2:05 PM

## 2010-10-05 NOTE — Progress Notes (Signed)
Summary: condition not improved > ov   Phone Note Call from Patient Call back at Home Phone [phone]   Caller: Patient Call For: wert Summary of Call: pt was recently seen. says that the meclizine isn't helping re: dizziness and ringing of ears[facility] [address]  Initial call taken by: Tivis Ringer, CNA,  July 19, 2010 10:15 AM  Follow-up for Phone Call        Pt states meclizine has nopt helped her dizziness at all. She states the only thing it does is make her sleepy. Please advise. Carron Curie CMA  July 19, 2010 11:17 AM bring all meds and med calendar in to see Tammy asap to make sure it's correct and we will recheck bp standing and refer to ENT at that time (they won't be albe to see her this week so she needs to come here first for recheck) Follow-up by: Nyoka Cowden MD,  July 19, 2010 11:39 AM  Additional Follow-up for Phone Call Additional follow up Details #1::        Called, spoke with pt.  She was informed of above recs per MW.  Appt scheduled with TP for 07-21-10 at 9am -- pt aware to bring all meds and med calander. She verbalized understanding Additional Follow-up by: Gweneth Dimitri RN,  July 19, 2010 12:08 PM

## 2010-10-05 NOTE — Progress Notes (Signed)
Summary: rx  Phone Note Call from Patient Call back at Home Phone [phone]   Caller: Patient Call For: wert Reason for Call: Talk to Nurse Summary of Call: pt has UTI.  can she get some meds -   Burns when she urinate, feels she has to go frequently, then sometimes she feels she has to go and doesn't have to.  Can she get something called in , leave specimen? [facility] Initial call taken by: Eugene Gavia,  Jan 25, 2010 10:30 AM  Follow-up for Phone Call        pt c/o burning with urination, feels the urgency to urinate frequently but then nothing really comes out since friday. Denies fever Pt was treated for UTI on 10-12-09 and states it did get better but symptoms have returned. . Please advise if pt needs to leave specimen. Carron Curie CMA  Jan 25, 2010 10:58 AM  per TP pt needs OV with urine sample first. pt advised and scheduled for 2:45. order placed. Carron Curie CMA  Jan 25, 2010 11:06 AM

## 2010-10-05 NOTE — Assessment & Plan Note (Signed)
Summary: Primary svc/ multiple issues   Primary Provider/Referring Provider:  Sherene Sires  CC:  3 month followup.  Pt c/o lower back pain worsening over the past few wks.  She states that pain worse when she stands for long periods of time.  Pt also c/o waking up with HAs for the past several wks- aleve helps HA and the back pain.  [name]  History of Present Illness: 77 yobf quit smoking  1987 with obesity/ aodm  with multiple chronic complaints and documented rhinitis with inconsistent compliance with nasal steroids   August 28, 2008 cpx chronic nasal congestion, non compliant with nasal steroids, but no longer using afrin either. zyrtec works to her satisfaction on as needed basis.  --cxr nml, vit d low at 15-rx vit d 50k 2x/wk, A1C 7.5 , trending down slowly so no change in rx.  November 27, 2008 ov co nose runs and stuffy when go outside no purulent secretions or overt hypo/hyperglycemia c/o's. Better with Zyrtec when remembers to take it. no cough or sob.     February 09, 2009--ov c/o chest congestion and coughing up "cloudy colored" sputum. Pt states she has difficulty getting sputum up.  In retrospect coughing for years. ugmentin 875mg  two times a day for  7 days w/ food 2)  Mucinex DM two times a day as needed cough/congestion 3)  Phenergan w/ codeine 1 tsp every 4-6 hr as needed cough/congestion  February 27, 2009 ov confused with med instructions with just a little bit of lingering cough but much better, instructed to do med reconciliation, never done  April 20, 2009 c/o daily cough x years, varies some but no pattern. not following med calendar provided, esp ppi, which uses as needed heartburn. rec two times a day and chlortrimeton but only needed once.  May 04, 2009 ov all better. broughts pills and calendar and also pill boxes which don't match up. Pt denies any significant sore throat, nasal congestion or excess secretions, fever, chills, sweats, unintended wt loss, pleuritic or exertional cp,  orthopnea pnd or leg swelling.  Pt also denies any obvious fluctuation in symptoms with weather or environmental change or other alleviating or aggravating factors.       June 02, 2009.  ov for a med. calendar otherwise doing well.  Last visit pt brought pills and calendar and pill boxes but didn't match. rec follow the calendar provided  December 14, 3 month followup.  Still with sensation of pnds, not sure prilosec helping. chlortrimeton helping but makes drowsy . Pt denies any significant sore throat, dysphagia, itching, sneezing,  nasal congestion or excess secretions,  fever, chills, sweats, unintended wt loss, pleuritic or exertional cp, hempoptysis, change in activity tolerance  orthopnea pnd or leg swelling.  Pt also denies any obvious fluctuation in symptoms with weather or environmental change or other alleviating or aggravating factors.    October 12, 2009--Presents for an acute office visit. Complains   increased urinary frequency/urgency, back discomfort x2days - denies f/c/s last uti 2 yrs ago. Feels like last UTI. cultures showed e coli, responded to cipro  November 18, 2009 CPX  Jan 25, 2010--Presents for UTI with increased frequency in urination, burning/pain with urination, low back pain onset Friday.  Last UTI was 3 months ago.  cipro rx symptoms resolved  February 17, 2010 ov f/u dm/ hbp/ lipids/uti  c/o lower back pain worsening over the past few wks.  She states that pain worse when she stands for long periods of  time.  cnent er of back no assoc with urination, no frequency or lateralizing pain. Pt also c/o waking up with HAs for the past several wks- aleve helps HA and the back pain.  no radicular features. no dysuria. Pt denies any significant sore throat, dysphagia, itching, sneezing,  nasal congestion or excess secretions,  fever, chills, sweats, unintended wt loss, pleuritic or exertional cp, hempoptysis, change in activity tolerance  orthopnea pnd or leg swelling   Current  Medications (verified): 1)  Zoloft 100 Mg  Tabs (Sertraline Hcl) .... Take 1 and 1/2 Tabs By Mouth At Bedtime 2)  Bayer Low Strength 81 Mg  Tbec (Aspirin) .... Take One Tab By Mouth Once Daily 3)  Metformin Hcl 500 Mg  Tabs (Metformin Hcl) .... One Tablet Twice Daily 4)  Centrum Silver   Tabs (Multiple Vitamins-Minerals) .... Take One Tab By Mouth Once Daily 5)  Diovan Hct 160-12.5 Mg  Tabs (Valsartan-Hydrochlorothiazide) .... One By Mouth Daily 6)  Vitamin D 1000 Unit Tabs (Cholecalciferol) .... Take 1 Tablet By Mouth Once A Day 7)  Januvia 100 Mg Tabs (Sitagliptin Phosphate) .... Take 1 Tablet By Mouth Once A Day 8)  Simvastatin 40 Mg  Tabs (Simvastatin) .... One At Bedtime 9)  Amaryl 2 Mg Tabs (Glimepiride) .[name].. 1 By Mouth Once Daily With A Meal 10)  Calcium Carbonate-Vitamin D 600-400 Mg-Unit  Tabs (Calcium Carbonate-Vitamin D) .... Take 1 Tablet By Mouth Two Times A Day 11)  Omeprazole 20 Mg Cpdr (Omeprazole) .... Take One 30-60 Min Before First and Last Meals of The Day 12)  Aleve 220 Mg  Tabs (Naproxen Sodium) .... Take As Directed With Food As Needed 13)  Chlor-Trimeton 4 Mg Tabs (Chlorpheniramine Maleate) .... Per Bottle When Needed 14)  Meclizine Hcl 25 Mg Tabs (Meclizine Hcl) .[name].. 1 Every 4 Hours As Needed 15)  Mucinex Dm 30-600 Mg  Tb12 (Dextromethorphan-Guaifenesin) .[name].. 1-2 Every 12 Hours As Needed 16)  Hydroxyzine Hcl 50 Mg Tabs (Hydroxyzine Hcl) .[name].. 1 Every 4 Hours As Needed 17)  Tylenol Extra Strength 500 Mg  Tabs (Acetaminophen) .... 2 Every 4-6 Hours As Needed 18)  Vicodin 5-500 Mg  Tabs (Hydrocodone-Acetaminophen) .... Take 1 Every 4 Hours As Needed 19)  Levsin 0.125 Mg Tabs (Hyoscyamine Sulfate) .[name].. 1 By Mouth Every 4-6 Hr As Needed Stomach Cramps, Bloating 20)  Gas-X Extra Strength 125 Mg Caps (Simethicone) .[name].. 1 With Each Meal As Needed 21)  Freestyle Lite Test  Strp (Glucose Blood) .... Test Bs Daily 22)  Freestyle Control Solution  Liqd (Blood Glucose Calibration)  .... Use As Directed 23)  Autolet Lancing Device  Misc (Lancet Devices) .... Use A Directed 24)  Unilet Lancet  Misc (Lancets) .... Use As Directed  Allergies (verified): 1)  * Albuterol  Past History:  Past Medical History: HYPERTENSION (ICD-401.9) HYPERLIPIDEMIA (ICD-272.4)     - target --LDL <70 (DM) due to DM AODM CHRONIC COUGH    - onset 1990's, almost daly since> resolved May 04, 2009 with ppi two times a day and chlortrimeton    - denied improved on ppi so  try off effective August 19, 2009 > worse cough and constipation > restarted CHRONIC RHINITIS (ICD-472.0)-non adherent with nasal steroids      -Ct sinus nml (08/1999, 01/2005) Hx of URTICARIA (ICD-708.9) LUMBAR RADICULOPATHY, RIGHT (ICD-724.4) DEPRESSION (ICD-311) FATIGUE, CHRONIC (ICD-780.79) MORBID OBESITY (ICD-278.01)      - Target wt  =  158  for BMI < 30  VIT D DEFICIENCY  dx 08/28/08      - Rx with 50 k biw x 12weeks 09/01/08> Osteopenia      -BMD 04/2000 -nml (-0.4 hip)      -BMD 09/15/08    T spine 3.3,   L Fem -1.0,  R Fem - 1.0 HEALTH MAINENANCE.....................................................................................Denise KitchenWert     -Td (1/06)     -Pnuemovax (1998), 05/02/2008     -Mammogram (9/08)-nml    - Colonsocopy nl 05/2008............................................................................Denise KitchenLina Sar      -GYN........................................................................................................Denise KitchenHolland      - ORTHO ..................................................................................................Denise KitchenOrtho     -CPX November 18, 2009      -refused flu shot August 18, 2009   COMPLEX MED REGIMEN    -- med calendar completed/adjusted September 15, 2008 > lost it February 27, 2009  > rewrote without bringing in bottles., redone June 02, 2009 > reviewed November 18, 2009 but not following    Vital Signs:  Patient profile:   77 year old female Weight:      152  pounds O2 Sat:      99 % on Room air Temp:     97.5 degrees F oral Pulse rate:   86 / minute BP sitting:   120 / 80  (left arm)  Vitals Entered By: Vernie Murders (February 17, 2010 9:26 AM)  O2 Flow:  Room air  Physical Exam  Additional Exam:  wt 153 > 152 August 18, 2009 >>151 November 18, 2009 >>155 Jan 25, 2010 > 152 February 17, 2010  amb bf nad HEENT: edentulous, nl  turbinates, and orophanx. Nl external ear canals without cough reflex NECK :  without JVD/Nodes/TM/ nl carotid upstrokes bilaterally LUNGS: no acc muscle use, clear to A and P bilaterally without cough on insp or exp maneuvers CV:  RRR  no s3 or murmur or increase in P2, no edema  . ABD:  soft and nontender with nl excursion in the supine position. No bruits or organomegaly, bowel sounds nl MS:  warm without deformities, calf tenderness, cyanosis or clubbing    Cholesterol               190 mg/dL                   1-610     ATP III Classification            Desirable:  < 200 mg/dL                    Borderline High:  200 - 239 mg/dL               High:  > = 240 mg/dL   Triglycerides             132.0 mg/dL                 9.6-045.4     Normal:  <150 mg/dL     Borderline High:  098 - 199 mg/dL   HDL                       11.91 mg/dL                 >47.82   VLDL Cholesterol          26.4 mg/dL                  9.5-62.1   LDL Cholesterol      [H]  308 mg/dL  0-99  CHO/HDL Ratio:  CHD Risk                             4                    Men          Women     1/2 Average Risk     3.4          3.3     Average Risk          5.0          4.4     2X Average Risk          9.6          7.1     3X Average Risk          15.0          11.0                           Tests: (2) Hepatic/Liver Function Panel (HEPATIC)   Total Bilirubin           0.4 mg/dL                   1.6-1.0   Direct Bilirubin          0.1 mg/dL                   9.6-0.4   Alkaline Phosphatase      51 U/L                      39-117   AST                        18 U/L                      0-37   ALT                       17 U/L                      0-35   Total Protein             7.3 g/dL                    5.4-0.9   Albumin                   4.5 g/dL                    8.1-1.9  Tests: (3) BMP (METABOL)   Sodium                    141 mEq/L                   135-145   Potassium                 4.6 mEq/L                   3.5-5.1   Chloride                  104 mEq/L  96-112   Carbon Dioxide            30 mEq/L                    19-32   Glucose              [H]  167 mg/dL                   16-10   BUN                       21 mg/dL                    9-60   Creatinine                1.0 mg/dL                   4.5-4.0   Calcium                   10.3 mg/dL                  9.8-11.9   GFR                       69.56 mL/min                >60  Tests: (4) Hemoglobin A1C (A1C)   Hemoglobin A1C       [H]  7.7 %                       4.6-6.5  Impression & Recommendations:  Problem # 1:  DM (ICD-250.00)  Her updated medication list for this problem includes:    Bayer Low Strength 81 Mg Tbec (Aspirin) .[name]... Take one tab by mouth once daily    Metformin Hcl 500 Mg Tabs (Metformin hcl) ..... One tablet twice daily    Januvia 100 Mg Tabs (Sitagliptin phosphate) .[name]... Take 1 tablet by mouth once a day    Amaryl 2 Mg Tabs (Glimepiride) .[name]... 1 by mouth once daily with a meal  Labs Reviewed: Creat: 1.2 (11/18/2009)    Reviewed HgBA1c results: 7.1 (11/18/2009) >  7.7 February 17, 2010   7.4 (06/02/2009)  Orders: Est. Patient Level IV (14782)  Problem # 2:  HYPERLIPIDEMIA (ICD-272.4)  Target < 70 given dm Her updated medication list for this problem includes:    Simvastatin 40 Mg Tabs (Simvastatin) ..... One at bedtime  Orders: TLB-Lipid Panel (80061-LIPID) TLB-Hepatic/Liver Function Pnl (80076-HEPATIC) Est. Patient Level IV (95621)  Labs Reviewed: SGOT: 21 (11/18/2009)   SGPT: 15 (11/18/2009)    HDL:49.90 (11/18/2009), 47.90 (08/18/2009)  LDL:76 (11/18/2009), 82 (08/18/2009)  > 115 February 17, 2010 needs work on diet  Chol:151 (11/18/2009), 150 (08/18/2009)  Trig:124.0 (11/18/2009), 100.0 (08/18/2009)  Problem # 3:  HYPERTENSION (ICD-401.9)  ok on rx  Her updated medication list for this problem includes:    Diovan Hct 160-12.5 Mg Tabs (Valsartan-hydrochlorothiazide) ..... One by mouth daily  Orders: Est. Patient Level IV (30865)  Problem # 4:  ARTHRALGIA UNSPECIFIED SITE (ICD-719.40)  reviewed approp rx with nsaids, rec ortho eval if aleve not effective or needing more than occasional vicodin   Each maintenance medication was reviewed in detail including most importantly the difference between maintenance and as needed and under what circumstances the prns are to be used. This was done  in the context of a medication calendar review which provided the patient with a user-friendly unambiguous mechanism for medication administration and reconciliation and provides an action plan for all active problems. It is critical that this be shown to every doctor  for modification during the office visit if necessary so the patient can use it as a working document.   Orders: Est. Patient Level IV (16109)  Medications Added to Medication List This Visit: 1)  Vicodin 5-500 Mg Tabs (Hydrocodone-acetaminophen) .... Take 1 every 4 hours as needed  Other Orders: TLB-BMP (Basic Metabolic Panel-BMET) (80048-METABOL) TLB-A1C / Hgb A1C (Glycohemoglobin) (83036-A1C)  Patient Instructions: 1)  Return to office in 3 months, sooner if needed  Prescriptions: VICODIN 5-500 MG  TABS (HYDROCODONE-ACETAMINOPHEN) take 1 every 4 hours as needed  #40 x 0   Entered and Authorized by:   Nyoka Cowden MD   Signed by:   Nyoka Cowden MD on 02/17/2010   Method used:   Print then Give to Patient   RxID:   [id number]

## 2010-10-05 NOTE — Assessment & Plan Note (Signed)
Summary: Primary svc/  f/u dm. hbp, hyperlipidemia, recurrent dizzy   Primary Otniel Hoe/Referring Kannon Granderson:  Wert  CC:  HA and dizziness.  She also c/o ringing in both ears x several days.Marland Kitchen  History of Present Illness: 3 yobf quit smoking  1987 with obesity/ aodm  with multiple chronic complaints and documented rhinitis with inconsistent compliance with nasal steroids   August 28, 2008 cpx chronic nasal congestion, non compliant with nasal steroids, but no longer using afrin either. zyrtec works to her satisfaction on as needed basis.  --cxr nml, vit d low at 15-rx vit d 50k 2x/wk, A1C 7.5 , trending down slowly so no change in rx.  November 27, 2008 ov co nose runs and stuffy when go outside no purulent secretions or overt hypo/hyperglycemia c/o's. Better with Zyrtec when remembers to take it. no cough or sob.     February 09, 2009--ov c/o chest congestion and coughing up "cloudy colored" sputum. Pt states she has difficulty getting sputum up.  In retrospect coughing for years. ugmentin 875mg  two times a day for  7 days w/ food 2)  Mucinex DM two times a day as needed cough/congestion 3)  Phenergan w/ codeine 1 tsp every 4-6 hr as needed cough/congestion  February 27, 2009 ov confused with med instructions with just a little bit of lingering cough but much better, instructed to do med reconciliation, never done  April 20, 2009 c/o daily cough x years, varies some but no pattern. not following med calendar provided, esp ppi, which uses as needed heartburn. rec two times a day and chlortrimeton but only needed once.  May 04, 2009 ov all better. broughts pills and calendar and also pill boxes which don't match up.    see page 2     May 20, 2010 ov  c/o waking up with HA almost every am. She states that her back/ left positional shoulder pain is the same- no better or worse, but is resolved with aleve.  She c/o dry cough at night x 1-2 wks. bp too high  rec double dose of  diovan  July 13, 2010 HA and dizziness.  She also c/o ringing in both ears x several days.  really bad 11/5 better now and hears ok. Pt denies any significant sore throat, dysphagia, itching, sneezing,  nasal congestion or excess secretions,  fever, chills, sweats, unintended wt loss, pleuritic or exertional cp, hempoptysis, change in activity tolerance  orthopnea pnd or leg swelling.   no room spinning  but dizzy, has not tried meclizine.  no ha, ataxia, nausea.  Current Medications (verified): 1)  Zoloft 100 Mg  Tabs (Sertraline Hcl) .... Take 1 and 1/2 Tabs By Mouth At Bedtime 2)  Bayer Low Strength 81 Mg  Tbec (Aspirin) .... Take One Tab By Mouth Once Daily 3)  Metformin Hcl 500 Mg  Tabs (Metformin Hcl) .... One Tablet Twice Daily 4)  Centrum Silver   Tabs (Multiple Vitamins-Minerals) .... Take One Tab By Mouth Once Daily 5)  Diovan Hct 320-25 Mg  Tabs (Valsartan-Hydrochlorothiazide) .... One By Mouth Daily 6)  Vitamin D 1000 Unit Tabs (Cholecalciferol) .... Take 1 Tablet By Mouth Once A Day 7)  Januvia 100 Mg Tabs (Sitagliptin Phosphate) .... Take 1 Tablet By Mouth Once A Day 8)  Simvastatin 40 Mg  Tabs (Simvastatin) .... One At Bedtime 9)  Amaryl 2 Mg Tabs (Glimepiride) .Marland Kitchen.. 1 By Mouth Once Daily With A Meal 10)  Calcium Carbonate-Vitamin D 600-400 Mg-Unit  Tabs (  Calcium Carbonate-Vitamin D) .... Take 1 Tablet By Mouth Two Times A Day 11)  Omeprazole 20 Mg Cpdr (Omeprazole) .... Take One 30-60 Min Before First and Last Meals of The Day 12)  Aleve 220 Mg  Tabs (Naproxen Sodium) .... Take As Directed With Food As Needed 13)  Chlor-Trimeton 4 Mg Tabs (Chlorpheniramine Maleate) .... Per Bottle When Needed 14)  Meclizine Hcl 25 Mg Tabs (Meclizine Hcl) .Marland Kitchen.. 1 Every 4 Hours As Needed 15)  Mucinex Dm 30-600 Mg  Tb12 (Dextromethorphan-Guaifenesin) .Marland Kitchen.. 1-2 Every 12 Hours As Needed 16)  Hydroxyzine Hcl 50 Mg Tabs (Hydroxyzine Hcl) .Marland Kitchen.. 1 Every 4 Hours As Needed 17)  Tylenol Extra Strength 500 Mg   Tabs (Acetaminophen) .... 2 Every 4-6 Hours As Needed 18)  Vicodin 5-500 Mg  Tabs (Hydrocodone-Acetaminophen) .... Take 1 Every 4 Hours As Needed 19)  Levsin 0.125 Mg Tabs (Hyoscyamine Sulfate) .Marland Kitchen.. 1 By Mouth Every 4-6 Hr As Needed Stomach Cramps, Bloating 20)  Gas-X Extra Strength 125 Mg Caps (Simethicone) .Marland Kitchen.. 1 With Each Meal As Needed 21)  Freestyle Lite Test  Strp (Glucose Blood) .... Test Bs Daily 22)  Freestyle Control Solution  Liqd (Blood Glucose Calibration) .... Use As Directed 23)  Autolet Lancing Device  Misc (Lancet Devices) .... Use A Directed 24)  Unilet Lancet  Misc (Lancets) .... Use As Directed 25)  Promethazine-Codeine 6.25-10 Mg/28ml Syrp (Promethazine-Codeine) .... Take 1 To 2 Tsp Every 4 To 6 Hrs As Needed  Allergies (verified): 1)  * Albuterol  Past History:  Past Medical History: HYPERTENSION (ICD-401.9) HYPERLIPIDEMIA (ICD-272.4)     - target --LDL <70 (DM) due to DM AODM CHRONIC COUGH    - onset 1990's, almost daly since> resolved May 04, 2009 with ppi two times a day and chlortrimeton    - denied improved on ppi so  try off effective August 19, 2009 > worse cough and constipation > restarted CHRONIC RHINITIS (ICD-472.0)-non adherent with nasal steroids      -Ct sinus nml (08/1999, 01/2005) Hx of URTICARIA (ICD-708.9) LUMBAR RADICULOPATHY, RIGHT (ICD-724.4) DEPRESSION (ICD-311) FATIGUE, CHRONIC (ICD-780.79) MORBID OBESITY (ICD-278.01)      - Target wt  =  158  for BMI < 30  VIT D DEFICIENCY dx 08/28/08      - Rx with 50 k biw x 12weeks 09/01/08  Osteopenia      -BMD 04/2000 -nml (-0.4 hip)      -BMD 09/15/08    T spine 3.3,   L Fem -1.0,  R Fem - 1.0 HEALTH MAINENANCE.......................................................................................Marland KitchenWert     -Td (1/06)     -Pnuemovax (1998), 05/02/2008     -Mammogram (9/08)-nml    - Colonsocopy nl 05/2008............................................................................Marland KitchenLina Sar       -GYN........................................................................................................Marland KitchenHolland      - ORTHO ..................................................................................................Marland KitchenOrtho     -CPX  November 18, 2009      -refused flu shot August 18, 2009  and May 20, 2010  COMPLEX MED REGIMEN    -- med calendar completed/adjusted  reviewed November 18, 2009 but not following > using it May 20, 2010     Vital Signs:  Patient profile:   77 year old female Weight:      152 pounds O2 Sat:      96 % on Room air Temp:     97.5 degrees F oral Pulse rate:   83 / minute BP sitting:   156 / 80  (left arm) BP standing:   144 / 70  (left arm)  Vitals Entered By: Vernie Murders (July 13, 2010 9:32 AM)  O2 Flow:  Room air  Physical Exam  Additional Exam:  wt   152 August 18, 2009> 155 Jan 25, 2010 > 152 February 17, 2010 > 155 May 20, 2010 > 132 July 13, 2010  amb bf nad HEENT: edentulous, nl  turbinates, and orophanx. Nl external ear canals without cough reflex NECK :  without JVD/Nodes/TM/ nl carotid upstrokes bilaterally LUNGS: no acc muscle use, clear to A and P bilaterally without cough on insp or exp maneuvers CV:  RRR  no s3 or murmur or increase in P2, no edema  . ABD:  soft and nontender with nl excursion in the supine position. No bruits or organomegaly, bowel sounds nl MS:  warm without deformities, calf tenderness, cyanosis or clubbing    Cholesterol               168 mg/dL                   1-610     ATP III Classification            Desirable:  < 200 mg/dL                    Borderline High:  200 - 239 mg/dL               High:  > = 240 mg/dL   Triglycerides             95.0 mg/dL                  9.6-045.4     Normal:  <150 mg/dL     Borderline High:  098 - 199 mg/dL   HDL                       11.91 mg/dL                 >47.82   VLDL Cholesterol          19.0 mg/dL                  9.5-62.1   LDL  Cholesterol      [H]  100 mg/dL                   3-08  CHO/HDL Ratio:  CHD Risk                             3                    Men          Women     1/2 Average Risk     3.4          3.3     Average Risk          5.0          4.4     2X Average Risk          9.6          7.1     3X Average Risk          15.0          11.0  Tests: (2) BMP (METABOL)   Sodium                    139 mEq/L                   135-145   Potassium                 4.6 mEq/L                   3.5-5.1   Chloride                  103 mEq/L                   96-112   Carbon Dioxide            29 mEq/L                    19-32   Glucose              [H]  139 mg/dL                   09-81   BUN                  [H]  27 mg/dL                    1-91   Creatinine                1.0 mg/dL                   4.7-8.2   Calcium                   10.1 mg/dL                  9.5-62.1   GFR                       66.38 mL/min                >60  Tests: (3) CBC Platelet w/Diff (CBCD)   White Cell Count          6.2 K/uL                    4.5-10.5   Red Cell Count            4.64 Mil/uL                 3.87-5.11   Hemoglobin                13.5 g/dL                   30.8-65.7   Hematocrit                40.3 %                      36.0-46.0   MCV                       86.9 fl                     78.0-100.0   MCHC  33.5 g/dL                   25.3-66.4   RDW                  [H]  15.3 %                      11.5-14.6   Platelet Count            158.0 K/uL                  150.0-400.0   Neutrophil %              57.9 %                      43.0-77.0   Lymphocyte %              27.1 %                      12.0-46.0   Monocyte %                10.1 %                      3.0-12.0   Eosinophils%              4.3 %                       0.0-5.0   Basophils %               0.6 %                       0.0-3.0   Neutrophill Absolute      3.6 K/uL                    1.4-7.7    Lymphocyte Absolute       1.7 K/uL                    0.7-4.0   Monocyte Absolute         0.6 K/uL                    0.1-1.0  Eosinophils, Absolute                             0.3 K/uL                    0.0-0.7   Basophils Absolute        0.0 K/uL                    0.0-0.1  Tests: (4) Hemoglobin A1C (A1C)   Hemoglobin A1C       [H]  8.2 %                       4.6-6.5  Impression & Recommendations:  Problem # 1:  DIABETES (ICD-V18.0)  HgbA1C  7.9 >8.2  reasonable control at age 80, work hard on diet/ ex rather than change rx.  See instructions for specific recommendations   Orders: Est. Patient Level IV (40347)  Problem # 2:  HYPERTENSION (ICD-401.9)  Her updated medication list for this problem includes:  Diovan Hct 320-25 Mg Tabs (Valsartan-hydrochlorothiazide) ..... One by mouth daily  Orders: Est. Patient Level IV (95284)  Problem # 3:  HYPERLIPIDEMIA (ICD-272.4) Target LDL < 70 in view of aodm, discussed Her updated medication list for this problem includes:    Simvastatin 40 Mg Tabs (Simvastatin) ..... One at bedtime     Labs Reviewed: SGOT: 18 (02/17/2010)   SGPT: 17 (02/17/2010)   HDL:48.90 (02/17/2010), 49.90 (11/18/2009)  LDL:115 (02/17/2010), 76 (13/24/4010)  > 100 July 13, 2010  Chol:190 (02/17/2010), 151 (11/18/2009)  Trig:132.0 (02/17/2010), 124.0 (11/18/2009)  Problem # 4:  DIZZINESS (ICD-780.4)  try meclizine, ent referral as needed as per calendar   Each maintenance medication was reviewed in detail including most importantly the difference between maintenance and as needed and under what circumstances the prns are to be used. This was done in the context of a medication calendar review which provided the patient with a user-friendly unambiguous mechanism for medication administration and reconciliation and provides an action plan for all active problems. It is critical that this be shown to every doctor  for modification during the office visit  if necessary so the patient can use it as a working document.   Orders: Est. Patient Level IV (27253)  Other Orders: TLB-Lipid Panel (80061-LIPID) TLB-BMP (Basic Metabolic Panel-BMET) (80048-METABOL) TLB-CBC Platelet - w/Differential (85025-CBCD) TLB-A1C / Hgb A1C (Glycohemoglobin) (83036-A1C)  Patient Instructions: 1)  See calendar for specific medication instructions and bring it back for each and every office visit for every healthcare [facility] you see.  Without it,  you may not receive the best quality medical care that we feel you deserve.  2)  for dizziness take meclizine as per calendar 3)  ok to break the diovan in half and take it twice daily 4)  Return to office in 3 months, sooner if needed

## 2010-10-05 NOTE — Assessment & Plan Note (Signed)
Summary: NP follow up - med calendar   Primary Provider/Referring Provider:  Sherene Sires  CC:  est med calendar - pt brought all meds with her today.  states the ringing in ears and dizziness have not improved since last ov - states meclizine does not help.  History of Present Illness: 77 yobf quit smoking  1987 with obesity/ aodm  with multiple chronic complaints and documented rhinitis with inconsistent compliance with nasal steroids   August 28, 2008 cpx chronic nasal congestion, non compliant with nasal steroids, but no longer using afrin either. zyrtec works to her satisfaction on as needed basis.  --cxr nml, vit d low at 15-rx vit d 50k 2x/wk, A1C 7.5 , trending down slowly so no change in rx.  November 27, 2008 ov co nose runs and stuffy when go outside no purulent secretions or overt hypo/hyperglycemia c/o's. Better with Zyrtec when remembers to take it. no cough or sob.     February 09, 2009--ov c/o chest congestion and coughing up "cloudy colored" sputum. Pt states she has difficulty getting sputum up.  In retrospect coughing for years. ugmentin 875mg  two times a day for  7 days w/ food 2)  Mucinex DM two times a day as needed cough/congestion 3)  Phenergan w/ codeine 1 tsp every 4-6 hr as needed cough/congestion  February 27, 2009 ov confused with med instructions with just a little bit of lingering cough but much better, instructed to do med reconciliation, never done  April 20, 2009 c/o daily cough x years, varies some but no pattern. not following med calendar provided, esp ppi, which uses as needed heartburn. rec two times a day and chlortrimeton but only needed once.  May 04, 2009 ov all better. broughts pills and calendar and also pill boxes which don't match up.    see page 2     May 20, 2010 ov  c/o waking up with HA almost every am. She states that her back/ left positional shoulder pain is the same- no better or worse, but is resolved with aleve.  She c/o dry cough at  night x 1-2 wks. bp too high  rec double dose of diovan  July 13, 2010 HA and dizziness.  She also c/o ringing in both ears x several days.  really bad 11/5 better now and hears ok.  >>rx meclizine and  diovan 1/2 two times a day.   07/21/10--Presents for follow up and med review. We reviewed her meds. and updated her med calendar. Her ringing in her ears is no better w/ intermittent light headness. Her meclizine expired in 2009. No visual/speech changes. She is better but it has not gone away. Labs last essentially unremarkable. Denies chest pain,  orthopnea, hemoptysis, fever, n/v/d, edema, headache.   Medications Prior to Update: 1)  Zoloft 100 Mg  Tabs (Sertraline Hcl) .... Take 1 and 1/2 Tabs By Mouth At Bedtime 2)  Bayer Low Strength 81 Mg  Tbec (Aspirin) .... Take One Tab By Mouth Once Daily 3)  Metformin Hcl 500 Mg  Tabs (Metformin Hcl) .... One Tablet Twice Daily 4)  Centrum Silver   Tabs (Multiple Vitamins-Minerals) .... Take One Tab By Mouth Once Daily 5)  Diovan Hct 320-25 Mg  Tabs (Valsartan-Hydrochlorothiazide) .... One By Mouth Daily 6)  Vitamin D 1000 Unit Tabs (Cholecalciferol) .... Take 1 Tablet By Mouth Once A Day 7)  Januvia 100 Mg Tabs (Sitagliptin Phosphate) .... Take 1 Tablet By Mouth Once A Day 8)  Simvastatin  40 Mg  Tabs (Simvastatin) .... One At Bedtime 9)  Amaryl 2 Mg Tabs (Glimepiride) .Marland Kitchen.. 1 By Mouth Once Daily With A Meal 10)  Calcium Carbonate-Vitamin D 600-400 Mg-Unit  Tabs (Calcium Carbonate-Vitamin D) .... Take 1 Tablet By Mouth Two Times A Day 11)  Omeprazole 20 Mg Cpdr (Omeprazole) .... Take One 30-60 Min Before First and Last Meals of The Day 12)  Aleve 220 Mg  Tabs (Naproxen Sodium) .... Take As Directed With Food As Needed 13)  Chlor-Trimeton 4 Mg Tabs (Chlorpheniramine Maleate) .... Per Bottle When Needed 14)  Meclizine Hcl 25 Mg Tabs (Meclizine Hcl) .Marland Kitchen.. 1 Every 4 Hours As Needed 15)  Mucinex Dm 30-600 Mg  Tb12 (Dextromethorphan-Guaifenesin) .Marland Kitchen.. 1-2  Every 12 Hours As Needed 16)  Hydroxyzine Hcl 50 Mg Tabs (Hydroxyzine Hcl) .Marland Kitchen.. 1 Every 4 Hours As Needed 17)  Tylenol Extra Strength 500 Mg  Tabs (Acetaminophen) .... 2 Every 4-6 Hours As Needed 18)  Vicodin 5-500 Mg  Tabs (Hydrocodone-Acetaminophen) .... Take 1 Every 4 Hours As Needed 19)  Levsin 0.125 Mg Tabs (Hyoscyamine Sulfate) .Marland Kitchen.. 1 By Mouth Every 4-6 Hr As Needed Stomach Cramps, Bloating 20)  Gas-X Extra Strength 125 Mg Caps (Simethicone) .Marland Kitchen.. 1 With Each Meal As Needed 21)  Freestyle Lite Test  Strp (Glucose Blood) .... Test Bs Daily 22)  Freestyle Control Solution  Liqd (Blood Glucose Calibration) .... Use As Directed 23)  Autolet Lancing Device  Misc (Lancet Devices) .... Use A Directed 24)  Unilet Lancet  Misc (Lancets) .... Use As Directed 25)  Promethazine-Codeine 6.25-10 Mg/48ml Syrp (Promethazine-Codeine) .... Take 1 To 2 Tsp Every 4 To 6 Hrs As Needed  Allergies (verified): 1)  * Albuterol  Past History:  Past Medical History: Last updated: 07/13/2010 HYPERTENSION (ICD-401.9) HYPERLIPIDEMIA (ICD-272.4)     - target --LDL <70 (DM) due to DM AODM CHRONIC COUGH    - onset 1990's, almost daly since> resolved May 04, 2009 with ppi two times a day and chlortrimeton    - denied improved on ppi so  try off effective August 19, 2009 > worse cough and constipation > restarted CHRONIC RHINITIS (ICD-472.0)-non adherent with nasal steroids      -Ct sinus nml (08/1999, 01/2005) Hx of URTICARIA (ICD-708.9) LUMBAR RADICULOPATHY, RIGHT (ICD-724.4) DEPRESSION (ICD-311) FATIGUE, CHRONIC (ICD-780.79) MORBID OBESITY (ICD-278.01)      - Target wt  =  158  for BMI < 30  VIT D DEFICIENCY dx 08/28/08      - Rx with 50 k biw x 12weeks 09/01/08  Osteopenia      -BMD 04/2000 -nml (-0.4 hip)      -BMD 09/15/08    T spine 3.3,   L Fem -1.0,  R Fem - 1.0 HEALTH MAINENANCE.......................................................................................Marland KitchenWert     -Td (1/06)      -Pnuemovax (1998), 05/02/2008     -Mammogram (9/08)-nml    - Colonsocopy nl 05/2008............................................................................Marland KitchenLina Sar      -GYN........................................................................................................Marland KitchenHolland      - ORTHO ..................................................................................................Marland KitchenOrtho     -CPX  November 18, 2009      -refused flu shot August 18, 2009  and May 20, 2010  [facility]    -- med calendar completed/adjusted  reviewed November 18, 2009 but not following > using it May 20, 2010     Family History: Last updated: 05/02/2008 mother living at 46, hx of CVA, DM, allergies father died at 57, hx of heart disease sibling 1 died at 92, had DM and cancer  sibling 2 healthy aunt had DM  Social History: Last updated: 07/21/2010 Patient states former smoker.  Quit 1987 exercises 1-2 times a week no caffiene quit ETOH 1952 divrced/widowed 6 children Moved [place] [facility] appt in [facility] 06/2009 declines flu shot 07-21-10  Risk Factors: Smoking Status: quit (08/16/2007)  Social History: Patient states former smoker.  Quit 1987 exercises 1-2 times a week no caffiene quit ETOH 1952 divrced/widowed 6 children Moved to new appt in retirement center 06/2009 declines flu shot 07-21-10  Review of Systems      See HPI  Vital Signs:  Patient profile:   77 year old female Height:      62.5 inches Weight:      152.50 pounds BMI:     27.55 O2 Sat:      99 % on Room air Temp:     98.0 degrees F oral Pulse rate:   79 / minute BP sitting:   130 / 82  (left arm) BP standing:   140 / 85 Cuff size:   regular  Vitals Entered By: Boone Master CNA/MA (July 21, 2010 9:08 AM)  O2 Flow:  Room air CC: est med calendar - pt brought all meds with her today.  states the ringing in ears and dizziness have not improved since last ov - states  meclizine does not help Is Patient Diabetic? Yes Comments Medications reviewed with patient Daytime contact number verified with patient. Boone Master CNA/MA  July 21, 2010 9:07 AM    Physical Exam  Additional Exam:  wt   152 August 18, 2009> 155 Jan 25, 2010 > 152 February 17, 2010 > 155 May 20, 2010 > 132 July 13, 2010  amb bf nad HEENT: edentulous, nl  turbinates, and orophanx. Nl external ear canals without cough reflex NECK :  without JVD/Nodes/TM/ nl carotid upstrokes bilaterally LUNGS: no acc muscle use, clear to A and P bilaterally without cough on insp or exp maneuvers CV:  RRR  no s3 or murmur or increase in P2, no edema  . ABD:  soft and nontender with nl excursion in the supine position. No bruits or organomegaly, bowel sounds nl MS:  warm without deformities, calf tenderness, cyanosis or clubbing Neuro: head manuevrs with reproducible symptoms , no nystagmus, PERRLA, EOMI w/o nystagmus, nml gait.  nml hand grips. equal strength bilaterally   Impression & Recommendations:  Problem # 1:  DIZZINESS (ICD-780.4) We are referring you to Ear specialist.  Follow med calendar and bring to each visit.  May decrease meclizine 1/2 -1 every 8 hr as needed.  Change positions slowly.  Increase fluids.  follow up Dr. Sherene Sires in 3 months for physical Please contact office for sooner follow up if symptoms do not improve or worsen   Orders: ENT Referral (ENT) Est. Patient Level III (60454)  Medications Added to Medication List This Visit: 1)  Meclizine Hcl 25 Mg Tabs (Meclizine hcl) .... 1/2-1 three times a day as needed dizziness  Patient Instructions: 1)  We are referring you to Ear specialist.  2)  Follow med calendar and bring to each visit.  3)  May decrease meclizine 1/2 -1 every 8 hr as needed.  4)  Change positions slowly.  5)  Increase fluids.  6)  follow up Dr. Sherene Sires in 3 months for physical 7)  Please contact office for sooner follow up if symptoms do not  improve or worsen  Prescriptions: MECLIZINE HCL 25 MG TABS (MECLIZINE HCL) 1/2-1 three times a  day as needed dizziness  #30 x 2   Entered and Authorized by:   Rubye Oaks NP   Signed by:   Rubye Oaks NP on 07/21/2010   Method used:   Electronically to        [facility] [phone]* (retail)       [address]       [place], [place]  [address]       Ph: [phone] or [phone]       Fax: [phone]   RxID:   [id number] AMARYL 2 MG TABS (GLIMEPIRIDE) 1 by mouth once daily with a meal  #30 Tablet x 5   Entered and Authorized by:   Rubye Oaks NP   Signed by:   Rubye Oaks NP on 07/21/2010   Method used:   [facility]        [facility] [phone][facility] (retail)       [address]       [place], [place]  [address]       Ph: [phone] or [phone]       Fax: [phone]   RxID:   [id number] SIMVASTATIN 40 MG  TABS (SIMVASTATIN) one at bedtime  #30 x 5   Entered and Authorized by:   Rubye Oaks NP   Signed by:   Rubye Oaks NP on 07/21/2010   Method used:   [facility] to        [facility] [phone]* (retail)       [address]       [place], [place]  [address]       Ph: [phone] or [phone]       Fax: [phone]   RxID:   [id number] JANUVIA 100 MG TABS (SITAGLIPTIN PHOSPHATE) Take 1 tablet by mouth once a day  #30 Tablet x 5   Entered and Authorized by:   Rubye Oaks NP   Signed by:   Rubye Oaks NP on 07/21/2010   Method used:   [facility] to        [facility] [address] [phone]* (retail)       [address]       [place], [place]  [address]       Ph: [phone] or [phone]       Fax: [phone]   RxID:   [id number] VITAMIN D 1000 UNIT TABS (CHOLECALCIFEROL) Take 1 tablet by mouth once a day  #30 Capsule x 5   Entered and Authorized by:   Rubye Oaks NP   Signed by:    Rubye Oaks NP on 07/21/2010   Method used:   [facility]        [facility] [phone]* (retail)       [address]       [place], [place]  [address]       Ph: [phone] or [phone]       Fax: [phone]   RxID:   [id number] DIOVAN HCT 320-25 MG  TABS (VALSARTAN-HYDROCHLOROTHIAZIDE) One by mouth daily  #30 x 11   Entered and Authorized by:   Rubye Oaks NP   Signed by:   Tammy Parrett NP on 07/21/2010   Method used:  [facility]ically to        [facility] [address] [phone]* (retail)       [address]       [place], [place]  [id number]       Ph: [phone] or [phone]       Fax: [phone]   RxID:   [id number] METFORMIN HCL 500 MG  TABS (METFORMIN HCL) One tablet twice daily  #60 Tablet x 5   Entered and Authorized by:   Rubye Oaks NP   Signed by:   Rubye Oaks NP on 07/21/2010   Method used:   [facility]ronically to        [facility] [phone]* (retail)       [address]       [place], [place]  [address]       Ph: [phone] or [phone]       Fax: [phone]   RxID:   [id number] ZOLOFT 100 MG  TABS (SERTRALINE HCL) take 1 and 1/2 tabs by mouth at bedtime  #45 Tablet x 10   Entered and Authorized by:   Rubye Oaks NP   Signed by:   Tammy Parrett NP on 07/21/2010   Method used:   Electronically to        [facility] [address] [phone]* (retail)       [address]       [place], [place]  [address]       Ph: [phone] or [phone]       Fax: [phone]   RxID:   [id number]     Appended Document: med calendar update    Clinical Lists Changes  Medications: Changed medication from ZOLOFT 100 MG  TABS (SERTRALINE HCL) take 1 and 1/2 tabs by mouth at bedtime to ZOLOFT 100 MG  TABS (SERTRALINE HCL) take 1/2 tab by mouth  every morning and 1 whole tab by mouth at bedtime - Signed Changed  medication from DIOVAN HCT 320-25 MG  TABS (VALSARTAN-HYDROCHLOROTHIAZIDE) One by mouth daily to DIOVAN HCT 320-25 MG  TABS (VALSARTAN-HYDROCHLOROTHIAZIDE) take 1/2 tab by mouth  every morning and every evening - Signed Changed medication from CHLOR-TRIMETON 4 MG TABS (CHLORPHENIRAMINE MALEATE) per bottle when needed to CHLOR-TRIMETON 4 MG TABS (CHLORPHENIRAMINE MALEATE) Take 1 tablet by mouth every 6 hours as needed - Signed Changed medication from MECLIZINE HCL 25 MG TABS (MECLIZINE HCL) 1/2-1 three times a day as needed dizziness to MECLIZINE HCL 25 MG TABS (MECLIZINE HCL) 1 tab by mouth every 4 hours as needed - Signed Removed medication of VICODIN 5-500 MG  TABS (HYDROCODONE-ACETAMINOPHEN) take 1 every 4 hours as needed - Signed Removed medication of LEVSIN 0.125 MG TABS (HYOSCYAMINE SULFATE) 1 by mouth every 4-6 hr as needed stomach cramps, bloating - Signed

## 2010-10-05 NOTE — Letter (Signed)
Summary: [facility]   Imported By: Sherian Rein 04/27/2010 08:46:31  _____________________________________________________________________  External Attachment:    Type:   Image     Comment:   External Document

## 2010-10-05 NOTE — Medication Information (Signed)
Summary: [facility] for Diabetes Supplies / Choice  [facility] for Diabetes Supplies / Choice   Imported By: Lennie Odor 08/05/2010 16:38:36  _____________________________________________________________________  External Attachment:    Type:   Image     Comment:   External Document

## 2010-10-05 NOTE — Assessment & Plan Note (Signed)
Summary: Primary svc/ cpx   Primary Provider/Referring Provider:  Sherene Sires  CC:  cpx fasting.  Marland Kitchen  History of Present Illness: 4 yobf quit smoking  1987 with obesity/ aodm  with multiple chronic complaints and documented rhinitis with inconsistent compliance with nasal steroids   August 28, 2008 cpx chronic nasal congestion, non compliant with nasal steroids, but no longer using afrin either. zyrtec works to her satisfaction on as needed basis.  --cxr nml, vit d low at 15-rx vit d 50k 2x/wk, A1C 7.5 , trending down slowly so no change in rx.  November 27, 2008 ov co nose runs and stuffy when go outside no purulent secretions or overt hypo/hyperglycemia c/o's. Better with Zyrtec when remembers to take it. no cough or sob.     February 09, 2009--ov c/o chest congestion and coughing up "cloudy colored" sputum. Pt states she has difficulty getting sputum up.  In retrospect coughing for years. ugmentin 875mg  two times a day for  7 days w/ food 2)  Mucinex DM two times a day as needed cough/congestion 3)  Phenergan w/ codeine 1 tsp every 4-6 hr as needed cough/congestion  February 27, 2009 ov confused with med instructions with just a little bit of lingering cough but much better, instructed to do med reconciliation, never done  April 20, 2009 c/o daily cough x years, varies some but no pattern. not following med calendar provided, esp ppi, which uses as needed heartburn. rec two times a day and chlortrimeton but only needed once.  May 04, 2009 ov all better. broughts pills and calendar and also pill boxes which don't match up. Pt denies any significant sore throat, nasal congestion or excess secretions, fever, chills, sweats, unintended wt loss, pleuritic or exertional cp, orthopnea pnd or leg swelling.  Pt also denies any obvious fluctuation in symptoms with weather or environmental change or other alleviating or aggravating factors.       June 02, 2009.  ov for a med. calendar otherwise doing  well.  Last visit pt brought pills and calendar and pill boxes but didn't match. rec follow the calendar provided  December 14, 3 month followup.  Still with sensation of pnds, not sure prilosec helping. chlortrimeton helping but makes drowsy . Pt denies any significant sore throat, dysphagia, itching, sneezing,  nasal congestion or excess secretions,  fever, chills, sweats, unintended wt loss, pleuritic or exertional cp, hempoptysis, change in activity tolerance  orthopnea pnd or leg swelling.  Pt also denies any obvious fluctuation in symptoms with weather or environmental change or other alleviating or aggravating factors.    October 12, 2009--Presents for an acute office visit. Complains   increased urinary frequency/urgency, back discomfort x2days - denies f/c/s last uti 2 yrs ago. Feels like last UTI. cultures showed e coli, responded to cipro  November 18, 2009 CPX no new complaints.  no tia/claudication/ paresthesias.  Pt denies any significant sore throat, dysphagia, itching, sneezing,  nasal congestion or excess secretions,  fever, chills, sweats, unintended wt loss, pleuritic or exertional cp, hempoptysis, change in activity tolerance  orthopnea pnd or leg swelling   Current Medications (verified): 1)  Zoloft 100 Mg  Tabs (Sertraline Hcl) .... Take 1 and 1/2 Tabs By Mouth At Bedtime 2)  Bayer Low Strength 81 Mg  Tbec (Aspirin) .... Take One Tab By Mouth Once Daily 3)  Metformin Hcl 500 Mg  Tabs (Metformin Hcl) .... One Tablet Twice Daily 4)  Centrum Silver   Tabs (Multiple Vitamins-Minerals) .Marland KitchenMarland KitchenMarland Kitchen  Take One Tab By Mouth Once Daily 5)  Diovan Hct 160-12.5 Mg  Tabs (Valsartan-Hydrochlorothiazide) .... One By Mouth Daily 6)  Vitamin D 1000 Unit Tabs (Cholecalciferol) .... Take 1 Tablet By Mouth Once A Day 7)  Januvia 100 Mg Tabs (Sitagliptin Phosphate) .... Take 1 Tablet By Mouth Once A Day 8)  Simvastatin 40 Mg  Tabs (Simvastatin) .... One At Bedtime 9)  Amaryl 2 Mg Tabs (Glimepiride) .Marland Kitchen.. 1  By Mouth Once Daily With A Meal 10)  Calcium Carbonate-Vitamin D 600-400 Mg-Unit  Tabs (Calcium Carbonate-Vitamin D) .... Take 1 Tablet By Mouth Two Times A Day 11)  Omeprazole 20 Mg Cpdr (Omeprazole) .... Take One 30-60 Min Before First and Last Meals of The Day 12)  Aleve 220 Mg  Tabs (Naproxen Sodium) .... Take As Directed With Food As Needed 13)  Chlor-Trimeton 4 Mg Tabs (Chlorpheniramine Maleate) .... Per Bottle When Needed 14)  Meclizine Hcl 25 Mg Tabs (Meclizine Hcl) .Marland Kitchen.. 1 Every 4 Hours As Needed 15)  Mucinex Dm 30-600 Mg  Tb12 (Dextromethorphan-Guaifenesin) .Marland Kitchen.. 1-2 Every 12 Hours As Needed 16)  Hydroxyzine Hcl 50 Mg Tabs (Hydroxyzine Hcl) .Marland Kitchen.. 1 Every 4 Hours As Needed 17)  Tylenol Extra Strength 500 Mg  Tabs (Acetaminophen) .... 2 Every 4-6 Hours As Needed 18)  Vicodin 5-500 Mg  Tabs (Hydrocodone-Acetaminophen) .... Take 1 Every 4 Hours As Needed 19)  Levsin 0.125 Mg Tabs (Hyoscyamine Sulfate) .Marland Kitchen.. 1 By Mouth Every 4-6 Hr As Needed Stomach Cramps, Bloating 20)  Gas-X Extra Strength 125 Mg Caps (Simethicone) .Marland Kitchen.. 1 With Each Meal As Needed 21)  Freestyle Lite Test  Strp (Glucose Blood) .... Test Bs Daily 22)  Freestyle Control Solution  Liqd (Blood Glucose Calibration) .... Use As Directed 23)  Autolet Lancing Device  Misc (Lancet Devices) .... Use A Directed 24)  Unilet Lancet  Misc (Lancets) .... Use As Directed  Allergies (verified): 1)  * Albuterol  Past History:  Past Medical History: HYPERTENSION (ICD-401.9) HYPERLIPIDEMIA (ICD-272.4)     - target --LDL <70 (DM) due to DM AODM CHRONIC COUGH    - onset 1990's, almost daly since> resolved May 04, 2009 with ppi two times a day and chlortrimeton    - denied improved on ppi so  try off effective August 19, 2009 > worse cough and constipation > restarted CHRONIC RHINITIS (ICD-472.0)-non adherent with nasal steroids      -Ct sinus nml (08/1999, 01/2005) Hx of URTICARIA (ICD-708.9) LUMBAR RADICULOPATHY, RIGHT  (ICD-724.4) DEPRESSION (ICD-311) FATIGUE, CHRONIC (ICD-780.79) MORBID OBESITY (ICD-278.01)      - Target wt  =  158  for BMI < 30  VIT D DEFICIENCY dx 08/28/08      - Rx with 50 k biw x 12weeks 09/01/08> Osteopenia      -BMD 04/2000 -nml (-0.4 hip)      -BMD 09/15/08    T spine 3.3,   L Fem -1.0,  R Fem - 1.0 HEALTH MAINENANCE...................................................................................Marland KitchenWert     -Td (1/06)     -Pnuemovax (1998), 05/02/2008     -Mammogram (9/08)-nml    - Colonsocopy nl 05/2008............................................................................Marland KitchenLina Sar      -GYN........................................................................................................Marland KitchenMarcelle Overlie     -CPX November 18, 2009      -refuses flu shot August 18, 2009   COMPLEX MED REGIMEN    -- med calendar completed/adjusted September 15, 2008 > lost it February 27, 2009  > rewrote without bringing in bottles., redone June 02, 2009 > reviewed November 18, 2009 but not following  Family History: Reviewed history from 05/02/2008 and no changes required. mother living at 16, hx of CVA, DM, allergies father died at 14, hx of heart disease sibling 1 died at 30, had DM and cancer sibling 2 healthy aunt had DM  Social History: Reviewed history from 08/18/2009 and no changes required. Patient states former smoker.  Quit 1987 exercises 1-2 times a week no caffiene quit ETOH 1952 divrced/widowed 6 children M[facility] [place] [facility]  Vital Signs:  Patient profile:   77 year old female Height:      62.5 inches Weight:      151 pounds O2 Sat:      99 % on Room air Temp:     97.6 degrees F oral Pulse rate:   87 / minute BP sitting:   138 / 62  (left arm)  Vitals Entered By: Vernie Murders (November 18, 2009 8:54 AM)  O2 Flow:  Room air CC: cpx fasting.     Physical Exam  Additional Exam:  wt 153 > 152 August 18, 2009 >>151 November 18, 2009   amb bf nad HEENT: edentulous, nl  turbinates, and orophanx. Nl external ear canals without cough reflex NECK :  without JVD/Nodes/TM/ nl carotid upstrokes bilaterally LUNGS: no acc muscle use, clear to A and P bilaterally without cough on insp or exp maneuvers CV:  RRR  no s3 or murmur or increase in P2, no edema  . ABD:  soft and nontender with nl excursion in the supine position. No bruits or organomegaly, bowel sounds nl MS:  warm without deformities, calf tenderness, cyanosis or clubbing SKIN: warm and dry without lesions   NEURO:  alert, approp, no deficits    holesterol               151 mg/dL                   1-610     ATP III Classification            Desirable:  < 200 mg/dL                    Borderline High:  200 - 239 mg/dL               High:  > = 240 mg/dL   Triglycerides             124.0 mg/dL                 9.6-045.4     Normal:  <150 mg/dL     Borderline High:  098 - 199 mg/dL   HDL                       11.91 mg/dL                 >47.82   VLDL Cholesterol          24.8 mg/dL                  9.5-62.1   LDL Cholesterol           76 mg/dL                    3-08  CHO/HDL Ratio:  CHD Risk  3                    Men          Women     1/2 Average Risk     3.4          3.3     Average Risk          5.0          4.4     2X Average Risk          9.6          7.1     3X Average Risk          15.0          11.0                           Tests: (2) BMP (METABOL)   Sodium                    142 mEq/L                   135-145   Potassium                 3.8 mEq/L                   3.5-5.1   Chloride                  106 mEq/L                   96-112   Carbon Dioxide            31 mEq/L                    19-32   Glucose              [H]  128 mg/dL                   60-45   BUN                       20 mg/dL                    4-09   Creatinine                1.2 mg/dL                   8.1-1.9   Calcium                   9.6 mg/dL                    1.4-78.2   GFR                       55.75 mL/min                >60  Tests: (3) CBC Platelet w/Diff (CBCD)   White Cell Count          6.2 K/uL                    4.5-10.5   Red Cell Count            4.47 Mil/uL  3.87-5.11   Hemoglobin                12.9 g/dL                   08.6-57.8   Hematocrit                39.5 %                      36.0-46.0   MCV                       88.3 fl                     78.0-100.0   MCHC                      32.6 g/dL                   46.9-62.9   RDW                       14.5 %                      11.5-14.6   Platelet Count       [L]  144.0 K/uL                  150.0-400.0   Neutrophil %              56.4 %                      43.0-77.0   Lymphocyte %              27.4 %                      12.0-46.0   Monocyte %                10.9 %                      3.0-12.0   Eosinophils%              4.5 %                       0.0-5.0   Basophils %               0.8 %                       0.0-3.0   Neutrophill Absolute      3.5 K/uL                    1.4-7.7   Lymphocyte Absolute       1.7 K/uL                    0.7-4.0   Monocyte Absolute         0.7 K/uL                    0.1-1.0  Eosinophils, Absolute                             0.3 K/uL  0.0-0.7   Basophils Absolute        0.0 K/uL                    0.0-0.1  Tests: (4) Hepatic/Liver Function Panel (HEPATIC)   Total Bilirubin      [L]  0.2 mg/dL                   6.0-4.5   Direct Bilirubin          0.1 mg/dL                   4.0-9.8   Alkaline Phosphatase      49 U/L                      39-117   AST                       21 U/L                      0-37   ALT                       15 U/L                      0-35   Total Protein             7.3 g/dL                    1.1-9.1   Albumin                   4.1 g/dL                    4.7-8.2  Tests: (5) TSH (TSH)   FastTSH                   2.42 uIU/mL                 0.35-5.50  Tests: (6) Full  Range CRP (FCRP)   Full Range CRP            0.70 mg/L                   0.00-5.00     Note:  An elevated hs-CRP (>5 mg/L) should be repeated after 2 weeks to rule out recent infection or trauma.  Tests: (7) Microalbumin/Creatinine Ratio (MALB)   Microalbumin              1.9 mg/dL                   9.5-6.2   Urine Creainine           216.0 mg/dL   Microalbumin Ratio        8.8 mg/g                    0.0-30.0  Tests: (8) Hemoglobin A1C (A1C)   Hemoglobin A1C       [H]  7.1 %                       4.6-6.5     Glycemic Control Guidelines for People with Diabetes:     Non Diabetic:  <6%     Goal of Therapy: <7%     Additional  Action Suggested:  >8%   Tests: (9) UDip w/Micro (URINE)   Color                     LT. YELLOW       RANGE:  Yellow;Lt. Yellow   Clarity                   Cloudy                      Clear   Specific Gravity          >=1.030                     1.000 - 1.030   Urine Ph                  5.0                         5.0-8.0   Protein                   NEGATIVE                    Negative   Urine Glucose             NEGATIVE                    Negative   Ketones                   NEGATIVE                    Negative   Urine Bilirubin           NEGATIVE                    Negative   Blood                     NEGATIVE                    Negative   Urobilinogen              0.2                         0.0 - 1.0   Leukocyte Esterace        MODERATE                    Negative   Nitrite                   NEGATIVE                    Negative   Urine WBC                 7-10/hpf                    0-2/hpf   Urine Bacteria            Many(>50/hpf)               None  CXR  Procedure date:  11/18/2009  Findings:        Comparison: Chest x-ray of 04/20/2009   Findings: The lungs are clear.  Mediastinal contours are stable. The heart is  within normal limits in size.  There are degenerative changes diffusely throughout the thoracic spine.   IMPRESSION: Stable chest  x-ray.  No active lung disease.    Impression & Recommendations:  Problem # 1:  HYPERTENSION (ICD-401.9)  Her updated medication list for this problem includes:    Diovan Hct 160-12.5 Mg Tabs (Valsartan-hydrochlorothiazide) ..... One by mouth daily  Orders: EKG w/ Interpretation (93000) > nsr , wnl T-2 View CXR (71020TC)  Problem # 2:  DM (ICD-250.00)  Labs Reviewed: Creat: 1.0 (08/18/2009)    Reviewed HgBA1c results: 7.4 (06/02/2009) > 7.1 November 18, 2009   8.0 (02/27/2009)  Problem # 3:  HYPERLIPIDEMIA (ICD-272.4)  Her updated medication list for this problem includes:    Simvastatin 40 Mg Tabs (Simvastatin) ..... One at bedtime  Labs Reviewed: SGOT: 24 (08/18/2009)   SGPT: 18 (08/18/2009)   HDL:47.90 (08/18/2009), 45.10 (02/27/2009)  LDL:82 (08/18/2009), 82 (02/27/2009)  > 76 November 18, 2009   Chol:150 (08/18/2009), 147 (02/27/2009)  Trig:100.0 (08/18/2009), 99.0 (02/27/2009)  Problem # 4:  VITAMIN D DEFICIENCY (ICD-268.9)  Orders: T-Vitamin D (25-Hydroxy) (16109-60454)  Problem # 5:  COUGH, CHRONIC (ICD-786.2) better with rx of gerd and pnds, no change in rx   Each maintenance medication was reviewed in detail including most importantly the difference between maintenance and as needed and under what circumstances the prns are to be used. This was done in the context of a medication calendar review which provided the patient with a user-friendly unambiguous mechanism for medication administration and reconciliation and provides an action plan for all active problems. It is critical that this be shown to every doctor  for modification during the office visit if necessary so the patient can use it as a working document. See instructions for specific recommendations   Other Orders: Est. Patient 65& > (09811) TLB-Lipid Panel (80061-LIPID) TLB-BMP (Basic Metabolic Panel-BMET) (80048-METABOL) TLB-CBC Platelet - w/Differential (85025-CBCD) TLB-Hepatic/Liver Function Pnl  (80076-HEPATIC) TLB-TSH (Thyroid Stimulating Hormone) (84443-TSH) TLB-CRP-High Sensitivity (C-Reactive Protein) (86140-FCRP) TLB-Microalbumin/Creat Ratio, Urine (82043-MALB) TLB-Udip ONLY (81003-UDIP) TLB-A1C / Hgb A1C (Glycohemoglobin) (83036-A1C)  Patient Instructions: 1)  Call [phone] for your results w/in next 3 days - if there's something important  I feel you need to know,  I'll be in touch with you directly.  2)  Please schedule a follow-up appointment in 3 months. 3)  Strongly recommend at least every other year that you have a gynecologic exam   [facility] ECG  ID: [id number] Patient: [name], [name] DOB: Sep 25, 1928 Age: 77 Years Old Sex: Female Race: Black Physician: Jefrey Raburn Height: 62.5 Weight: 151 Status: Unconfirmed Past Medical History:  HYPERTENSION (ICD-401.9) HYPERLIPIDEMIA (ICD-272.4)     - target --LDL <70 (DM) due to DM  DM (ICD-250.00)  CHRONIC COUGH    - onset 1990's, almost daly since> resolved May 04, 2009 with ppi two times a day and chlortrimeton    - denied improved on ppi so  try off effective August 19, 2009  CHRONIC RHINITIS (ICD-472.0)-non adherent with nasal steroids      -Ct sinus nml (08/1999, 01/2005) Hx of URTICARIA (ICD-708.9) LUMBAR RADICULOPATHY, RIGHT (ICD-724.4) DEPRESSION (ICD-311) FATIGUE, CHRONIC (ICD-780.79) MORBID OBESITY (ICD-278.01)      - Target wt  =  158  for BMI < 30  VIT D DEFICIENCY dx 08/28/08      - Rx with 50 k biw x 12weeks 09/01/08> Osteopenia      -BMD 04/2000 -nml (-0.4 hip)      -BMD 09/15/08    T  spine 3.3,   L Fem -1.0,  R Fem - 1.0 HEALTH MAINENANCE...................................................................................Marland KitchenWert     -Td (1/06)     -Pnuemovax (1998), 05/02/2008     -Mammogram (9/08)-nml      -GYN........................................................................................................Marland KitchenMarcelle Overlie     -CPX  August 28, 2008      -refuses flu shot August 18, 2009   COMPLEX MED REGIMEN    -- med calendar completed/adjusted September 15, 2008 > lost it February 27, 2009  > rewrote without bringing in bottles., redone June 02, 2009    Recorded: 11/18/2009 09:07 AM P/PR: 102 ms / 153 ms - Heart rate (maximum exercise) QRS: 70 QT/QTc/QTd: 385 ms / 425 ms / 124 ms - Heart rate (maximum exercise)  P/QRS/T axis: 74 deg / 58 deg / 69 deg - Heart rate (maximum exercise)  Heartrate: 83 bpm  Interpretation:   sinus rhythm  probable LVH   age-corrected Sokolow index (SV1+RV5 or V6) = 3.1 mV   age-corrected vectorial R in extremity leads = 2.7 mV  minimal high-lateral and lateral repolarization disturbance secondary to LVH, consider also ischemia   flat or low negative T in I aVL V5 V6   Abnormal ECG

## 2010-10-05 NOTE — Assessment & Plan Note (Signed)
Summary: Acute NP office visit - UTI   Primary Provider/Referring Provider:  Sherene Sires  CC:  UTI with increased frequency in urination, burning/pain with urination, and low back pain onset Friday.  History of Present Illness: 77 yobf quit smoking  1987 with obesity/ aodm  with multiple chronic complaints and documented rhinitis with inconsistent compliance with nasal steroids   August 28, 2008 cpx chronic nasal congestion, non compliant with nasal steroids, but no longer using afrin either. zyrtec works to her satisfaction on as needed basis.  --cxr nml, vit d low at 15-rx vit d 50k 2x/wk, A1C 7.5 , trending down slowly so no change in rx.  November 27, 2008 ov co nose runs and stuffy when go outside no purulent secretions or overt hypo/hyperglycemia c/o's. Better with Zyrtec when remembers to take it. no cough or sob.     February 09, 2009--ov c/o chest congestion and coughing up "cloudy colored" sputum. Pt states she has difficulty getting sputum up.  In retrospect coughing for years. ugmentin 875mg  two times a day for  7 days w/ food 2)  Mucinex DM two times a day as needed cough/congestion 3)  Phenergan w/ codeine 1 tsp every 4-6 hr as needed cough/congestion  February 27, 2009 ov confused with med instructions with just a little bit of lingering cough but much better, instructed to do med reconciliation, never done  April 20, 2009 c/o daily cough x years, varies some but no pattern. not following med calendar provided, esp ppi, which uses as needed heartburn. rec two times a day and chlortrimeton but only needed once.  May 04, 2009 ov all better. broughts pills and calendar and also pill boxes which don't match up. Pt denies any significant sore throat, nasal congestion or excess secretions, fever, chills, sweats, unintended wt loss, pleuritic or exertional cp, orthopnea pnd or leg swelling.  Pt also denies any obvious fluctuation in symptoms with weather or environmental change or other  alleviating or aggravating factors.       June 02, 2009.  ov for a med. calendar otherwise doing well.  Last visit pt brought pills and calendar and pill boxes but didn't match. rec follow the calendar provided  December 14, 3 month followup.  Still with sensation of pnds, not sure prilosec helping. chlortrimeton helping but makes drowsy . Pt denies any significant sore throat, dysphagia, itching, sneezing,  nasal congestion or excess secretions,  fever, chills, sweats, unintended wt loss, pleuritic or exertional cp, hempoptysis, change in activity tolerance  orthopnea pnd or leg swelling.  Pt also denies any obvious fluctuation in symptoms with weather or environmental change or other alleviating or aggravating factors.    October 12, 2009--Presents for an acute office visit. Complains   increased urinary frequency/urgency, back discomfort x2days - denies f/c/s last uti 2 yrs ago. Feels like last UTI. cultures showed e coli, responded to cipro  November 18, 2009 CPX no new complaints.  no tia/claudication/ paresthesias.  Pt denies any significant sore throat, dysphagia, itching, sneezing,  nasal congestion or excess secretions,  fever, chills, sweats, unintended wt loss, pleuritic or exertional cp, hempoptysis, change in activity tolerance  orthopnea pnd or leg swelling   May 75, 2011--Presents for UTI with increased frequency in urination, burning/pain with urination, low back pain onset Friday.  Last UTI was 3 months ago. She says she got UTI last time when she went to daughter's house and took bath. She just returned last week from daughters where she took  bath. She typically takes shower -not baths. Denies chest pain, dyspnea, orthopnea, hemoptysis, fever, n/v/d, edema, headache, hematuria, abd pain.   Medications Prior to Update: 1)  Zoloft 100 Mg  Tabs (Sertraline Hcl) .... Take 1 and 1/2 Tabs By Mouth At Bedtime 2)  Bayer Low Strength 81 Mg  Tbec (Aspirin) .... Take One Tab By Mouth Once  Daily 3)  Metformin Hcl 500 Mg  Tabs (Metformin Hcl) .... One Tablet Twice Daily 4)  Centrum Silver   Tabs (Multiple Vitamins-Minerals) .... Take One Tab By Mouth Once Daily 5)  Diovan Hct 160-12.5 Mg  Tabs (Valsartan-Hydrochlorothiazide) .... One By Mouth Daily 6)  Vitamin D 1000 Unit Tabs (Cholecalciferol) .... Take 1 Tablet By Mouth Once A Day 7)  Januvia 100 Mg Tabs (Sitagliptin Phosphate) .... Take 1 Tablet By Mouth Once A Day 8)  Simvastatin 40 Mg  Tabs (Simvastatin) .... One At Bedtime 9)  Amaryl 2 Mg Tabs (Glimepiride) .Marland Kitchen.. 1 By Mouth Once Daily With A Meal 10)  Calcium Carbonate-Vitamin D 600-400 Mg-Unit  Tabs (Calcium Carbonate-Vitamin D) .... Take 1 Tablet By Mouth Two Times A Day 11)  Omeprazole 20 Mg Cpdr (Omeprazole) .... Take One 30-60 Min Before First and Last Meals of The Day 12)  Aleve 220 Mg  Tabs (Naproxen Sodium) .... Take As Directed With Food As Needed 13)  Chlor-Trimeton 4 Mg Tabs (Chlorpheniramine Maleate) .... Per Bottle When Needed 14)  Meclizine Hcl 25 Mg Tabs (Meclizine Hcl) .Marland Kitchen.. 1 Every 4 Hours As Needed 15)  Mucinex Dm 30-600 Mg  Tb12 (Dextromethorphan-Guaifenesin) .Marland Kitchen.. 1-2 Every 12 Hours As Needed 16)  Hydroxyzine Hcl 50 Mg Tabs (Hydroxyzine Hcl) .Marland Kitchen.. 1 Every 4 Hours As Needed 17)  Tylenol Extra Strength 500 Mg  Tabs (Acetaminophen) .... 2 Every 4-6 Hours As Needed 18)  Vicodin 5-500 Mg  Tabs (Hydrocodone-Acetaminophen) .... Take 1 Every 4 Hours As Needed 19)  Levsin 0.125 Mg Tabs (Hyoscyamine Sulfate) .Marland Kitchen.. 1 By Mouth Every 4-6 Hr As Needed Stomach Cramps, Bloating 20)  Gas-X Extra Strength 125 Mg Caps (Simethicone) .Marland Kitchen.. 1 With Each Meal As Needed 21)  Freestyle Lite Test  Strp (Glucose Blood) .... Test Bs Daily 22)  Freestyle Control Solution  Liqd (Blood Glucose Calibration) .... Use As Directed 23)  Autolet Lancing Device  Misc (Lancet Devices) .... Use A Directed 24)  Unilet Lancet  Misc (Lancets) .... Use As Directed  Current Medications (verified): 1)   Zoloft 100 Mg  Tabs (Sertraline Hcl) .... Take 1 and 1/2 Tabs By Mouth At Bedtime 2)  Bayer Low Strength 81 Mg  Tbec (Aspirin) .... Take One Tab By Mouth Once Daily 3)  Metformin Hcl 500 Mg  Tabs (Metformin Hcl) .... One Tablet Twice Daily 4)  Centrum Silver   Tabs (Multiple Vitamins-Minerals) .... Take One Tab By Mouth Once Daily 5)  Diovan Hct 160-12.5 Mg  Tabs (Valsartan-Hydrochlorothiazide) .... One By Mouth Daily 6)  Vitamin D 1000 Unit Tabs (Cholecalciferol) .... Take 1 Tablet By Mouth Once A Day 7)  Januvia 100 Mg Tabs (Sitagliptin Phosphate) .... Take 1 Tablet By Mouth Once A Day 8)  Simvastatin 40 Mg  Tabs (Simvastatin) .... One At Bedtime 9)  Amaryl 2 Mg Tabs (Glimepiride) .Marland Kitchen.. 1 By Mouth Once Daily With A Meal 10)  Calcium Carbonate-Vitamin D 600-400 Mg-Unit  Tabs (Calcium Carbonate-Vitamin D) .... Take 1 Tablet By Mouth Two Times A Day 11)  Omeprazole 20 Mg Cpdr (Omeprazole) .... Take One 30-60 Min Before First and Last  Meals of The Day 12)  Aleve 220 Mg  Tabs (Naproxen Sodium) .... Take As Directed With Food As Needed 13)  Chlor-Trimeton 4 Mg Tabs (Chlorpheniramine Maleate) .... Per Bottle When Needed 14)  Meclizine Hcl 25 Mg Tabs (Meclizine Hcl) .Marland Kitchen.. 1 Every 4 Hours As Needed 15)  Mucinex Dm 30-600 Mg  Tb12 (Dextromethorphan-Guaifenesin) .Marland Kitchen.. 1-2 Every 12 Hours As Needed 16)  Hydroxyzine Hcl 50 Mg Tabs (Hydroxyzine Hcl) .Marland Kitchen.. 1 Every 4 Hours As Needed 17)  Tylenol Extra Strength 500 Mg  Tabs (Acetaminophen) .... 2 Every 4-6 Hours As Needed 18)  Vicodin 5-500 Mg  Tabs (Hydrocodone-Acetaminophen) .... Take 1 Every 4 Hours As Needed 19)  Levsin 0.125 Mg Tabs (Hyoscyamine Sulfate) .Marland Kitchen.. 1 By Mouth Every 4-6 Hr As Needed Stomach Cramps, Bloating 20)  Gas-X Extra Strength 125 Mg Caps (Simethicone) .Marland Kitchen.. 1 With Each Meal As Needed 21)  Freestyle Lite Test  Strp (Glucose Blood) .... Test Bs Daily 22)  Freestyle Control Solution  Liqd (Blood Glucose Calibration) .... Use As Directed 23)   Autolet Lancing Device  Misc (Lancet Devices) .... Use A Directed 24)  Unilet Lancet  Misc (Lancets) .... Use As Directed  Allergies (verified): 1)  * Albuterol  Past History:  Past Medical History: Last updated: 11/18/2009 HYPERTENSION (ICD-401.9) HYPERLIPIDEMIA (ICD-272.4)     - target --LDL <70 (DM) due to DM AODM CHRONIC COUGH    - onset 1990's, almost daly since> resolved May 04, 2009 with ppi two times a day and chlortrimeton    - denied improved on ppi so  try off effective August 19, 2009 > worse cough and constipation > restarted CHRONIC RHINITIS (ICD-472.0)-non adherent with nasal steroids      -Ct sinus nml (08/1999, 01/2005) Hx of URTICARIA (ICD-708.9) LUMBAR RADICULOPATHY, RIGHT (ICD-724.4) DEPRESSION (ICD-311) FATIGUE, CHRONIC (ICD-780.79) MORBID OBESITY (ICD-278.01)      - Target wt  =  158  for BMI < 30  VIT D DEFICIENCY dx 08/28/08      - Rx with 50 k biw x 12weeks 09/01/08> Osteopenia      -BMD 04/2000 -nml (-0.4 hip)      -BMD 09/15/08    T spine 3.3,   L Fem -1.0,  R Fem - 1.0 HEALTH MAINENANCE...................................................................................Marland KitchenWert     -Td (1/06)     -Pnuemovax (1998), 05/02/2008     -Mammogram (9/08)-nml    - Colonsocopy nl 05/2008............................................................................Marland KitchenLina Sar      -GYN........................................................................................................Marland KitchenMarcelle Overlie     -CPX November 18, 2009      -refuses flu shot August 18, 2009   COMPLEX MED REGIMEN    -- med calendar completed/adjusted September 15, 2008 > lost it February 27, 2009  > rewrote without bringing in bottles., redone June 02, 2009 > reviewed November 18, 2009 but not following    Family History: Last updated: 05/02/2008 mother living at 76, hx of CVA, DM, allergies father died at 38, hx of heart disease sibling 1 died at 69, had DM and cancer sibling 2 healthy aunt  had DM  Social History: Last updated: 08/18/2009 Patient states former smoker.  Quit 1987 exercises 1-2 times a week no caffiene quit ETOH 1952 divrced/widowed 6 children [place]  Risk Factors: Smoking Status: quit (08/16/2007)  Review of Systems      See HPI  Vital Signs:  Patient profile:   77 year old female Height:      62.5 inches Weight:  155.50 pounds BMI:     28.09 O2 Sat:      97 % on Room air Temp:     98.3 degrees F oral Pulse rate:   87 / minute BP sitting:   144 / 80  (left arm) Cuff size:   regular  Vitals Entered By: Boone Master CNA/MA (Jan 25, 2010 2:57 PM)  O2 Flow:  Room air CC: UTI with increased frequency in urination, burning/pain with urination, low back pain onset Friday Is Patient Diabetic? No Comments Medications reviewed with patient Daytime contact number verified with patient. Boone Master CNA/MA  Jan 25, 2010 2:58 PM    Physical Exam  Additional Exam:  wt 153 > 152 August 18, 2009 >>151 November 18, 2009 >>155 Jan 25, 2010  amb bf nad HEENT: edentulous, nl  turbinates, and orophanx. Nl external ear canals without cough reflex NECK :  without JVD/Nodes/TM/ nl carotid upstrokes bilaterally LUNGS: no acc muscle use, clear to A and P bilaterally without cough on insp or exp maneuvers CV:  RRR  no s3 or murmur or increase in P2, no edema  . ABD:  soft and nontender with nl excursion in the supine position. No bruits or organomegaly, bowel sounds nl MS:  warm without deformities, calf tenderness, cyanosis or clubbing SKIN: warm and dry without lesions   NEURO:  alert, approp, no deficits     Impression & Recommendations:  Problem # 1:  CYSTITIS, ACUTE (ICD-595.0) UA c/w UTI REC:  Cipro 250mg  two times a day for 7 days  Pyridium 200mg  three times a day as needed bladder spasm Increase fluids.  Avoid baths. Shower instead.  Use Cotton underwear, sensitive soaps, void frequently. Do not  hold urine, Try to void extra after finished urinating.  Please contact office for sooner follow up if symptoms do not improve or worsen  Orders: Est. Patient Level III (16109) Prescription Created Electronically [phone])  Her updated medication list for this problem includes:    Cipro 250 Mg Tabs (Ciprofloxacin hcl) .Marland Kitchen... 1 by mouth two times a day    Pyridium 200 Mg Tabs (Phenazopyridine hcl) .Marland Kitchen... 1 by mouth three times a day as needed bladder spasm  Medications Added to Medication List This Visit: 1)  Cipro 250 Mg Tabs (Ciprofloxacin hcl) .Marland Kitchen.. 1 by mouth two times a day 2)  Pyridium 200 Mg Tabs (Phenazopyridine hcl) .Marland Kitchen.. 1 by mouth three times a day as needed bladder spasm  Complete Medication List: 1)  Zoloft 100 Mg Tabs (Sertraline hcl) .... Take 1 and 1/2 tabs by mouth at bedtime 2)  Bayer Low Strength 81 Mg Tbec (Aspirin) .... Take one tab by mouth once daily 3)  Metformin Hcl 500 Mg Tabs (Metformin hcl) .... One tablet twice daily 4)  Centrum Silver Tabs (Multiple vitamins-minerals) .... Take one tab by mouth once daily 5)  Diovan Hct 160-12.5 Mg Tabs (Valsartan-hydrochlorothiazide) .... One by mouth daily 6)  Vitamin D 1000 Unit Tabs (Cholecalciferol) .... Take 1 tablet by mouth once a day 7)  Januvia 100 Mg Tabs (Sitagliptin phosphate) .... Take 1 tablet by mouth once a day 8)  Simvastatin 40 Mg Tabs (Simvastatin) .... One at bedtime 9)  Amaryl 2 Mg Tabs (Glimepiride) .Marland Kitchen.. 1 by mouth once daily with a meal 10)  Calcium Carbonate-vitamin D 600-400 Mg-unit Tabs (Calcium carbonate-vitamin d) .... Take 1 tablet by mouth two times a day 11)  Omeprazole 20 Mg Cpdr (Omeprazole) .... Take one 30-60 min before first and last meals  of the day 12)  Aleve 220 Mg Tabs (Naproxen sodium) .... Take as directed with food as needed 13)  Chlor-trimeton 4 Mg Tabs (Chlorpheniramine maleate) .... Per bottle when needed 14)  Meclizine Hcl 25 Mg Tabs (Meclizine hcl) .Marland Kitchen.. 1 every 4 hours as needed 15)   Mucinex Dm 30-600 Mg Tb12 (Dextromethorphan-guaifenesin) .Marland Kitchen.. 1-2 every 12 hours as needed 16)  Hydroxyzine Hcl 50 Mg Tabs (Hydroxyzine hcl) .Marland Kitchen.. 1 every 4 hours as needed 17)  Tylenol Extra Strength 500 Mg Tabs (Acetaminophen) .... 2 every 4-6 hours as needed 18)  Vicodin 5-500 Mg Tabs (Hydrocodone-acetaminophen) .... Take 1 every 4 hours as needed 19)  Levsin 0.125 Mg Tabs (Hyoscyamine sulfate) .Marland Kitchen.. 1 by mouth every 4-6 hr as needed stomach cramps, bloating 20)  Gas-x Extra Strength 125 Mg Caps (Simethicone) .Marland KitchenMarland Kitchen. 1 with each meal as needed 21)  Freestyle Lite Test Strp (Glucose blood) .... Test bs daily 22)  Freestyle Control Solution Liqd (Blood glucose calibration) .... Use as directed 23)  Autolet Lancing Device Misc (Lancet devices) .... Use a directed 24)  Unilet Lancet Misc (Lancets) .... Use as directed 25)  Cipro 250 Mg Tabs (Ciprofloxacin hcl) .Marland Kitchen.. 1 by mouth two times a day 26)  Pyridium 200 Mg Tabs (Phenazopyridine hcl) .Marland Kitchen.. 1 by mouth three times a day as needed bladder spasm  Other Orders: TLB-Udip w/ Micro (81001-URINE) T-Urine Culture (Spectrum Order) [id number])  Patient Instructions: 1)  Cipro 250mg  two times a day for 7 days  2)  Pyridium 200mg  three times a day as needed bladder spasm 3)  Increase fluids.  4)  Avoid baths. Shower instead.  5)  Use Cotton underwear, sensitive soaps, void frequently. Do not hold urine, Try to void extra after finished urinating.  6)  Please contact office for sooner follow up if symptoms do not improve or worsen  Prescriptions: PYRIDIUM 200 MG TABS (PHENAZOPYRIDINE HCL) 1 by mouth three times a day as needed bladder spasm  #6 x 0   Entered and Authorized by:   Rubye Oaks NP   Signed by:   Rubye Oaks NP on 01/25/2010   Method used:   Electronically to        [facility] [phone]* (retail)       [address] [place]  [address]       Ph: [phone] or [phone]       Fax: [phone]   RxID:    [id number] CIPRO 250 MG TABS (CIPROFLOXACIN HCL) 1 by mouth two times a day  #14 x 0   Entered and Authorized by:   Rubye Oaks NP   Signed by:   Rubye Oaks NP on 01/25/2010   Method used:   Electronically to        [facility] [phone][facility] (retail)       [address]       [place], [place]  [address]       Ph: [phone] or [phone]       Fax: [phone]   RxID:   [id number]

## 2010-10-05 NOTE — Consult Note (Signed)
Summary: [facility]  [facility]   Imported By: Sherian Rein 08/04/2010 09:31:10  _____________________________________________________________________  External Attachment:    Type:   Image     Comment:   External Document

## 2010-10-05 NOTE — Assessment & Plan Note (Signed)
Summary: Primary svc/ f/u ov multiple issues   Primary Provider/Referring Provider:  Sherene Sires  CC:  3 month followup.  Pt still c/o waking up with HA almost every am. She states that her back pain is the same- no better or worse.  She c/o dry cough at night x 1-2 wks. .  History of Present Illness: 56 yobf quit smoking  1987 with obesity/ aodm  with multiple chronic complaints and documented rhinitis with inconsistent compliance with nasal steroids   August 28, 2008 cpx chronic nasal congestion, non compliant with nasal steroids, but no longer using afrin either. zyrtec works to her satisfaction on as needed basis.  --cxr nml, vit d low at 15-rx vit d 50k 2x/wk, A1C 7.5 , trending down slowly so no change in rx.  November 27, 2008 ov co nose runs and stuffy when go outside no purulent secretions or overt hypo/hyperglycemia c/o's. Better with Zyrtec when remembers to take it. no cough or sob.     February 09, 2009--ov c/o chest congestion and coughing up "cloudy colored" sputum. Pt states she has difficulty getting sputum up.  In retrospect coughing for years. ugmentin 875mg  two times a day for  7 days w/ food 2)  Mucinex DM two times a day as needed cough/congestion 3)  Phenergan w/ codeine 1 tsp every 4-6 hr as needed cough/congestion  February 27, 2009 ov confused with med instructions with just a little bit of lingering cough but much better, instructed to do med reconciliation, never done  April 20, 2009 c/o daily cough x years, varies some but no pattern. not following med calendar provided, esp ppi, which uses as needed heartburn. rec two times a day and chlortrimeton but only needed once.  May 04, 2009 ov all better. broughts pills and calendar and also pill boxes which don't match up. Pt denies any significant sore throat, nasal congestion or excess secretions, fever, chills, sweats, unintended wt loss, pleuritic or exertional cp, orthopnea pnd or leg swelling.  Pt also denies any obvious  fluctuation in symptoms with weather or environmental change or other alleviating or aggravating factors.       June 02, 2009.  ov for a med. calendar otherwise doing well.  Last visit pt brought pills and calendar and pill boxes but didn't match. rec follow the calendar provided     May 20, 2010 ov  c/o waking up with HA almost every am. She states that her back/ left positional shoulder pain is the same- no better or worse, but is resolved with aleve.  She c/o dry cough at night x 1-2 wks.  Pt denies any significant sore throat, dysphagia, itching, sneezing,  nasal congestion or excess secretions,  fever, chills, sweats, unintended wt loss, pleuritic or exertional cp, hempoptysis, change in activity tolerance  orthopnea pnd or leg swelling. no hyperglycemic or hypoglycemic episodes though fasting sugars intermittently above 140  Current Medications (verified): 1)  Zoloft 100 Mg  Tabs (Sertraline Hcl) .... Take 1 and 1/2 Tabs By Mouth At Bedtime 2)  Bayer Low Strength 81 Mg  Tbec (Aspirin) .... Take One Tab By Mouth Once Daily 3)  Metformin Hcl 500 Mg  Tabs (Metformin Hcl) .... One Tablet Twice Daily 4)  Centrum Silver   Tabs (Multiple Vitamins-Minerals) .... Take One Tab By Mouth Once Daily 5)  Diovan Hct 160-12.5 Mg  Tabs (Valsartan-Hydrochlorothiazide) .... One By Mouth Daily 6)  Vitamin D 1000 Unit Tabs (Cholecalciferol) .... Take 1 Tablet By Mouth Once  A Day 7)  Januvia 100 Mg Tabs (Sitagliptin Phosphate) .... Take 1 Tablet By Mouth Once A Day 8)  Simvastatin 40 Mg  Tabs (Simvastatin) .... One At Bedtime 9)  Amaryl 2 Mg Tabs (Glimepiride) .Marland Kitchen.. 1 By Mouth Once Daily With A Meal 10)  Calcium Carbonate-Vitamin D 600-400 Mg-Unit  Tabs (Calcium Carbonate-Vitamin D) .... Take 1 Tablet By Mouth Two Times A Day 11)  Omeprazole 20 Mg Cpdr (Omeprazole) .... Take One 30-60 Min Before First and Last Meals of The Day 12)  Aleve 220 Mg  Tabs (Naproxen Sodium) .... Take As Directed With Food As  Needed 13)  Chlor-Trimeton 4 Mg Tabs (Chlorpheniramine Maleate) .... Per Bottle When Needed 14)  Meclizine Hcl 25 Mg Tabs (Meclizine Hcl) .Marland Kitchen.. 1 Every 4 Hours As Needed 15)  Mucinex Dm 30-600 Mg  Tb12 (Dextromethorphan-Guaifenesin) .Marland Kitchen.. 1-2 Every 12 Hours As Needed 16)  Hydroxyzine Hcl 50 Mg Tabs (Hydroxyzine Hcl) .Marland Kitchen.. 1 Every 4 Hours As Needed 17)  Tylenol Extra Strength 500 Mg  Tabs (Acetaminophen) .... 2 Every 4-6 Hours As Needed 18)  Vicodin 5-500 Mg  Tabs (Hydrocodone-Acetaminophen) .... Take 1 Every 4 Hours As Needed 19)  Levsin 0.125 Mg Tabs (Hyoscyamine Sulfate) .Marland Kitchen.. 1 By Mouth Every 4-6 Hr As Needed Stomach Cramps, Bloating 20)  Gas-X Extra Strength 125 Mg Caps (Simethicone) .Marland Kitchen.. 1 With Each Meal As Needed 21)  Freestyle Lite Test  Strp (Glucose Blood) .... Test Bs Daily 22)  Freestyle Control Solution  Liqd (Blood Glucose Calibration) .... Use As Directed 23)  Autolet Lancing Device  Misc (Lancet Devices) .... Use A Directed 24)  Unilet Lancet  Misc (Lancets) .... Use As Directed  Allergies (verified): 1)  * Albuterol  Past History:  Past Medical History: HYPERTENSION (ICD-401.9) HYPERLIPIDEMIA (ICD-272.4)     - target --LDL <70 (DM) due to DM AODM CHRONIC COUGH    - onset 1990's, almost daly since> resolved May 04, 2009 with ppi two times a day and chlortrimeton    - denied improved on ppi so  try off effective August 19, 2009 > worse cough and constipation > restarted CHRONIC RHINITIS (ICD-472.0)-non adherent with nasal steroids      -Ct sinus nml (08/1999, 01/2005) Hx of URTICARIA (ICD-708.9) LUMBAR RADICULOPATHY, RIGHT (ICD-724.4) DEPRESSION (ICD-311) FATIGUE, CHRONIC (ICD-780.79) MORBID OBESITY (ICD-278.01)      - Target wt  =  158  for BMI < 30  VIT D DEFICIENCY dx 08/28/08      - Rx with 50 k biw x 12weeks 09/01/08> Osteopenia      -BMD 04/2000 -nml (-0.4 hip)      -BMD 09/15/08    T spine 3.3,   L Fem -1.0,  R Fem - 1.0 HEALTH  MAINENANCE.......................................................................................Marland KitchenWert     -Td (1/06)     -Pnuemovax (1998), 05/02/2008     -Mammogram (9/08)-nml    - Colonsocopy nl 05/2008............................................................................Marland KitchenLina Sar      -GYN........................................................................................................Marland KitchenHolland      - ORTHO ..................................................................................................Marland KitchenOrtho     -CPX November 18, 2009      -refused flu shot August 18, 2009  and May 20, 2010  COMPLEX MED REGIMEN    -- med calendar completed/adjusted  reviewed November 18, 2009 but not following > using it May 20, 2010     Vital Signs:  Patient profile:   77 year old female Weight:      155 pounds O2 Sat:      97 % on Room air Temp:  98.0 degrees F oral Pulse rate:   83 / minute BP sitting:   158 / 90  (left arm)  Vitals Entered By: Vernie Murders (May 20, 2010 9:40 AM)  O2 Flow:  Room air  Physical Exam  Additional Exam:  wt   152 August 18, 2009 >>151 November 18, 2009 >>155 Jan 25, 2010 > 152 February 17, 2010 > 155 May 20, 2010  amb bf nad HEENT: edentulous, nl  turbinates, and orophanx. Nl external ear canals without cough reflex NECK :  without JVD/Nodes/TM/ nl carotid upstrokes bilaterally LUNGS: no acc muscle use, clear to A and P bilaterally without cough on insp or exp maneuvers CV:  RRR  no s3 or murmur or increase in P2, no edema  . ABD:  soft and nontender with nl excursion in the supine position. No bruits or organomegaly, bowel sounds nl MS:  warm without deformities, calf tenderness, cyanosis or clubbing    Sodium                    141 mEq/L                   135-145   Potassium                 4.3 mEq/L                   3.5-5.1   Chloride                  105 mEq/L                   96-112   Carbon Dioxide            31 mEq/L                     19-32   Glucose              [H]  121 mg/dL                   36-64   BUN                       21 mg/dL                    4-03   Creatinine                0.9 mg/dL                   4.7-4.2   Calcium                   10.2 mg/dL                  5.9-56.3   GFR                       79.63 mL/min                >60  Tests: (2) Hemoglobin A1C (A1C)   Hemoglobin A1C       [H]  7.9 %                       4.6-6.5    Impression & Recommendations:  Problem # 1:  DM (ICD-250.00) Assessment Unchanged    Labs Reviewed: Creat: 1.0 (02/17/2010)  Reviewed HgBA1c results: 7.7 (02/17/2010) > 7.9 May 20, 2010   7.1 (11/18/2009)  risk of hypoglycemia at advanced age precludes more aggressive rx at this point  Problem # 2:  HYPERTENSION (ICD-401.9) not optimal with am ha suggesting possible hbp cause  The following medications were removed from the medication list:    Diovan Hct 160-12.5 Mg Tabs (Valsartan-hydrochlorothiazide) ..... One by mouth daily Her updated medication list for this problem includes:    Diovan Hct 320-25 Mg Tabs (Valsartan-hydrochlorothiazide) ..... One by mouth daily     Problem # 3:  HYPERLIPIDEMIA (ICD-272.4)  targe ldl < 70 due to dm  Her updated medication list for this problem includes:    Simvastatin 40 Mg Tabs (Simvastatin) ..... One at bedtime  Labs Reviewed: SGOT: 18 (02/17/2010)   SGPT: 17 (02/17/2010)   HDL:48.90 (02/17/2010), 49.90 (11/18/2009)  LDL:115 (02/17/2010), 76 (16/06/9603)  Chol:190 (02/17/2010), 151 (11/18/2009)  Trig:132.0 (02/17/2010), 124.0 (11/18/2009)   Each maintenance medication was reviewed in detail including most importantly the difference between maintenance and as needed and under what circumstances the prns are to be used. This was done in the context of a medication calendar review which provided the patient with a user-friendly unambiguous mechanism for medication administration and reconciliation  and provides an action plan for all active problems. It is critical that this be shown to every doctor  for modification during the office visit if necessary so the patient can use it as a working document.   Orders: Est. Patient Level IV (54098)  Medications Added to Medication List This Visit: 1)  Diovan Hct 320-25 Mg Tabs (Valsartan-hydrochlorothiazide) .... One by mouth daily  Other Orders: Prescription Created Electronically [id number]) TLB-BMP (Basic Metabolic Panel-BMET) (80048-METABOL) TLB-A1C / Hgb A1C (Glycohemoglobin) (83036-A1C)  Patient Instructions: 1)  take two diovan a day until the new prescription is filled for the double strength diovan to take once a day 2)  See calendar for specific medication instructions and bring it back for each and every office visit for every healthcare provider you see.  Without it,  you may not receive the best quality medical care that we feel you deserve.  3)  Return to office in 3 months, sooner if needed  Prescriptions: DIOVAN HCT 320-25 MG  TABS (VALSARTAN-HYDROCHLOROTHIAZIDE) One by mouth daily  #30 x 11   Entered and Authorized by:   Nyoka Cowden MD   Signed by:   Nyoka Cowden MD on 05/20/2010   Method used:   Electronically to        [facility] [phone]* (retail)       [address]       [place], [place]  [address]       Ph: [phone] or [phone]       Fax: [phone]   RxID:   [id number]

## 2010-10-07 NOTE — Assessment & Plan Note (Signed)
Summary: UTI//lmr   Primary Provider/Referring Provider:  Sherene Sires  CC:  c/o pain w/urination x 2 wks., pain low back, no hematuria, slight fever occass., poor appetite , and finished Cipro yesterday.  History of Present Illness: 77 yobf quit smoking  1987 with obesity/ aodm  with multiple chronic complaints and documented rhinitis with inconsistent compliance with nasal steroids   August 28, 2008 cpx chronic nasal congestion, non compliant with nasal steroids, but no longer using afrin either. zyrtec works to her satisfaction on as needed basis.  --cxr nml, vit d low at 15-rx vit d 50k 2x/wk, A1C 7.5 , trending down slowly so no change in rx.  November 27, 2008 ov co nose runs and stuffy when go outside no purulent secretions or overt hypo/hyperglycemia c/o's. Better with Zyrtec when remembers to take it. no cough or sob.     February 09, 2009--ov c/o chest congestion and coughing up "cloudy colored" sputum. Pt states she has difficulty getting sputum up.  In retrospect coughing for years. ugmentin 875mg  two times a day for  7 days w/ food 2)  Mucinex DM two times a day as needed cough/congestion 3)  Phenergan w/ codeine 1 tsp every 4-6 hr as needed cough/congestion  February 27, 2009 ov confused with med instructions with just a little bit of lingering cough but much better, instructed to do med reconciliation, never done  April 20, 2009 c/o daily cough x years, varies some but no pattern. not following med calendar provided, esp ppi, which uses as needed heartburn. rec two times a day and chlortrimeton but only needed once.  May 04, 2009 ov all better. broughts pills and calendar and also pill boxes which don't match up.    see page 2     May 20, 2010 ov  c/o waking up with HA almost every am. She states that her back/ left positional shoulder pain is the same- no better or worse, but is resolved with aleve.  She c/o dry cough at night x 1-2 wks. bp too high  rec double dose of  diovan  July 13, 2010 HA and dizziness.  She also c/o ringing in both ears x several days.  really bad 11/5 better now and hears ok.  >>rx meclizine and  diovan 1/2 two times a day.   07/21/10--Presents for follow up and med review. We reviewed her meds. and updated her med calendar. Her ringing in her ears is no better w/ intermittent light headness. Her meclizine expired in 2009. No visual/speech changes. She is better but it has not gone away. Labs last essentially unremarkable. Denies chest pain,  orthopnea, hemoptysis, fever, n/v/d, edema, headache.   77 yobf 2012 --Presents for an acute office visit. Complains of persistent UTI symptoms. Developed dysuria, low back pain, low grade fever, poor appetite. She was called in cipro which she finished yesterday. Some improvement but not resolved. Denies chest pain, dyspnea, orthopnea, hemoptysis,  n/v/d, edema, headache, hematuria, vaginal bleeding.    Preventive Screening-Counseling & Management  Alcohol-Tobacco     Smoking Status: quit     Year Quit: 25 yrs. ago  Current Medications (verified): 1)  Zoloft 100 Mg  Tabs (Sertraline Hcl) .... Take 1/2 Tab By Mouth  Every Morning and 1 Whole Tab By Mouth At Bedtime 2)  Bayer Low Strength 81 Mg  Tbec (Aspirin) .... Take One Tab By Mouth Once Daily 3)  Metformin Hcl 500 Mg  Tabs (Metformin Hcl) .... One Tablet Twice Daily  4)  Centrum Silver   Tabs (Multiple Vitamins-Minerals) .... Take One Tab By Mouth Once Daily 5)  Diovan Hct 320-25 Mg  Tabs (Valsartan-Hydrochlorothiazide) .... Take 1/2 Tab By Mouth  Every Morning and Every Evening 6)  Vitamin D 1000 Unit Tabs (Cholecalciferol) .... Take 1 Tablet By Mouth Once A Day 7)  Januvia 100 Mg Tabs (Sitagliptin Phosphate) .... Take 1 Tablet By Mouth Once A Day 8)  Simvastatin 40 Mg  Tabs (Simvastatin) .... One At Bedtime 9)  Amaryl 2 Mg Tabs (Glimepiride) .Marland Kitchen.. 1 By Mouth Once Daily With A Meal 10)  Calcium Carbonate-Vitamin D 600-400 Mg-Unit   Tabs (Calcium Carbonate-Vitamin D) .... Take 1 Tablet By Mouth Two Times A Day 11)  Omeprazole 20 Mg Cpdr (Omeprazole) .... Take One 30-60 Min Before First and Last Meals of The Day 12)  Aleve 220 Mg  Tabs (Naproxen Sodium) .... Take As Directed With Food As Needed 13)  Chlor-Trimeton 4 Mg Tabs (Chlorpheniramine Maleate) .... Take 1 Tablet By Mouth Every 6 Hours As Needed 14)  Meclizine Hcl 25 Mg Tabs (Meclizine Hcl) .Marland Kitchen.. 1 Tab By Mouth Every 4 Hours As Needed 15)  Mucinex Dm 30-600 Mg  Tb12 (Dextromethorphan-Guaifenesin) .Marland Kitchen.. 1-2 Every 12 Hours As Needed 16)  Hydroxyzine Hcl 50 Mg Tabs (Hydroxyzine Hcl) .Marland Kitchen.. 1 Every 4 Hours As Needed 17)  Tylenol Extra Strength 500 Mg  Tabs (Acetaminophen) .... 2 Every 4-6 Hours As Needed 18)  Gas-X Extra Strength 125 Mg Caps (Simethicone) .Marland Kitchen.. 1 With Each Meal As Needed 19)  Promethazine-Codeine 6.25-10 Mg/27ml Syrp (Promethazine-Codeine) .... Take 1 To 2 Tsp Every 4 To 6 Hrs As Needed 20)  Freestyle Lite Test  Strp (Glucose Blood) .... Test Bs Daily 21)  Freestyle Control Solution  Liqd (Blood Glucose Calibration) .... Use As Directed 22)  Autolet Lancing Device  Misc (Lancet Devices) .... Use A Directed 23)  Unilet Lancet  Misc (Lancets) .... Use As Directed  Allergies (verified): 1)  * Albuterol  Past History:  Past Medical History: Last updated: 07/13/2010 HYPERTENSION (ICD-401.9) HYPERLIPIDEMIA (ICD-272.4)     - target --LDL <70 (DM) due to DM AODM CHRONIC COUGH    - onset 1990's, almost daly since> resolved May 04, 2009 with ppi two times a day and chlortrimeton    - denied improved on ppi so  try off effective August 19, 2009 > worse cough and constipation > restarted CHRONIC RHINITIS (ICD-472.0)-non adherent with nasal steroids      -Ct sinus nml (08/1999, 01/2005) Hx of URTICARIA (ICD-708.9) LUMBAR RADICULOPATHY, RIGHT (ICD-724.4) DEPRESSION (ICD-311) FATIGUE, CHRONIC (ICD-780.79) MORBID OBESITY (ICD-278.01)      - Target wt  =  158   for BMI < 30  VIT D DEFICIENCY dx 08/28/08      - Rx with 50 k biw x 12weeks 09/01/08  Osteopenia      -BMD 04/2000 -nml (-0.4 hip)      -BMD 09/15/08    T spine 3.3,   L Fem -1.0,  R Fem - 1.0 HEALTH MAINENANCE.......................................................................................Marland KitchenWert     -Td (1/06)     -Pnuemovax (1998), 05/02/2008     -Mammogram (9/08)-nml    - Colonsocopy nl 05/2008............................................................................Marland KitchenLina Sar      -GYN........................................................................................................Marland KitchenHolland      - ORTHO ..................................................................................................Marland KitchenOrtho     -CPX  November 18, 2009      -refused flu shot August 18, 2009  and May 20, 2010  COMPLEX MED REGIMEN    -- med calendar completed/adjusted  reviewed November 18, 2009 but not following > using it May 20, 2010     Family History: Last updated: 05/02/2008 mother living at 33, hx of CVA, DM, allergies father died at 20, hx of heart disease sibling 1 died at 22, had DM and cancer sibling 2 healthy aunt had DM  Social History: Last updated: 07/21/2010 Patient states former smoker.  Quit 1987 exercises 1-2 times a week no caffiene quit ETOH 1952 divrced/widowed 6 children M[facility] 06/2009 declines flu shot 07-21-10  Risk Factors: Smoking Status: quit (09/16/2010)  Review of Systems      See HPI  Vital Signs:  Patient profile:   77 year old female Height:      62.5 inches Weight:      153.38 pounds O2 Sat:      98 % on Room air Temp:     97.1 degrees F oral Pulse rate:   72 / minute BP sitting:   152 / 74  (left arm) Cuff size:   regular  Vitals Entered By: Elray Buba RN (September 16, 2010 11:41 AM)  O2 Flow:  Room air CC: c/o pain w/urination x 2 wks.,pain low back, no hematuria,slight fever occass.,poor  appetite ,finished Cipro yesterday Is Patient Diabetic? Yes Comments Medications reviewed with patient ,phone # verfied.Elray Buba RN  September 16, 2010 11:42 AM    Physical Exam  Additional Exam:  wt   152 August 18, 2009> 155 Jan 25, 2010 > 152 February 17, 2010 > 155 May 20, 2010 > 132 July 13, 2010 >153 September 17, 2010 amb bf nad HEENT: edentulous, nl  turbinates, and orophanx. Nl external ear canals without cough reflex NECK :  without JVD/Nodes/TM/ nl carotid upstrokes bilaterally LUNGS: no acc muscle use, clear to A and P bilaterally without cough on insp or exp maneuvers CV:  RRR  no s3 or murmur or increase in P2, no edema  . ABD:  soft   with nl excursion in the supine position. No bruits or organomegaly, bowel sounds nl, mild suprapubic tenderness, no guarding or rebound MS:  warm without deformities, calf tenderness, cyanosis or clubbing    Impression & Recommendations:  Problem # 1:  CYSTITIS, ACUTE (ICD-595.0)  UA today shows postive leukocytes/wbc  Plan: Extend Cipro 500mg  two times a day for 5 days Increase fluids  Empty bladder often and well.  follow up Dr. Sherene Sires as planned  Please contact office for sooner follow up if symptoms do not improve or worsen  The following medications were removed from the medication list:    Ciprofloxacin Hcl 250 Mg Tabs (Ciprofloxacin hcl) ..... One am and pm Her updated medication list for this problem includes:    Cipro 500 Mg Tabs (Ciprofloxacin hcl) .Marland Kitchen... 1 by mouth two times a day  Orders: Est. Patient Level III (56213)  Medications Added to Medication List This Visit: 1)  Diovan Hct 160-12.5 Mg Tabs (Valsartan-hydrochlorothiazide) .Marland Kitchen.. 1 by mouth two times a day 2)  Cipro 500 Mg Tabs (Ciprofloxacin hcl) .Marland Kitchen.. 1 by mouth two times a day  Complete Medication List: 1)  Zoloft 100 Mg Tabs (Sertraline hcl) .... Take 1/2 tab by mouth  every morning and 1 whole tab by mouth at bedtime 2)  Bayer Low Strength 81 Mg Tbec  (Aspirin) .... Take one tab by mouth once daily 3)  Metformin Hcl 500 Mg Tabs (Metformin hcl) .... One tablet twice daily 4)  Centrum Silver Tabs (Multiple vitamins-minerals) .Marland KitchenMarland KitchenMarland Kitchen  Take one tab by mouth once daily 5)  Diovan Hct 160-12.5 Mg Tabs (Valsartan-hydrochlorothiazide) .Marland Kitchen.. 1 by mouth two times a day 6)  Vitamin D 1000 Unit Tabs (Cholecalciferol) .... Take 1 tablet by mouth once a day 7)  Januvia 100 Mg Tabs (Sitagliptin phosphate) .... Take 1 tablet by mouth once a day 8)  Simvastatin 40 Mg Tabs (Simvastatin) .... One at bedtime 9)  Amaryl 2 Mg Tabs (Glimepiride) .Marland Kitchen.. 1 by mouth once daily with a meal 10)  Calcium Carbonate-vitamin D 600-400 Mg-unit Tabs (Calcium carbonate-vitamin d) .... Take 1 tablet by mouth two times a day 11)  Omeprazole 20 Mg Cpdr (Omeprazole) .... Take one 30-60 min before first and last meals of the day 12)  Aleve 220 Mg Tabs (Naproxen sodium) .... Take as directed with food as needed 13)  Chlor-trimeton 4 Mg Tabs (Chlorpheniramine maleate) .... Take 1 tablet by mouth every 6 hours as needed 14)  Meclizine Hcl 25 Mg Tabs (Meclizine hcl) .Marland Kitchen.. 1 tab by mouth every 4 hours as needed 15)  Mucinex Dm 30-600 Mg Tb12 (Dextromethorphan-guaifenesin) .Marland Kitchen.. 1-2 every 12 hours as needed 16)  Hydroxyzine Hcl 50 Mg Tabs (Hydroxyzine hcl) .Marland Kitchen.. 1 every 4 hours as needed 17)  Tylenol Extra Strength 500 Mg Tabs (Acetaminophen) .... 2 every 4-6 hours as needed 18)  Gas-x Extra Strength 125 Mg Caps (Simethicone) .Marland KitchenMarland Kitchen. 1 with each meal as needed 19)  Promethazine-codeine 6.25-10 Mg/68ml Syrp (Promethazine-codeine) .... Take 1 to 2 tsp every 4 to 6 hrs as needed 20)  Freestyle Lite Test Strp (Glucose blood) .... Test bs daily 21)  Freestyle Control Solution Liqd (Blood glucose calibration) .... Use as directed 22)  Autolet Lancing Device Misc (Lancet devices) .... Use a directed 23)  Unilet Lancet Misc (Lancets) .... Use as directed 24)  Cipro 500 Mg Tabs (Ciprofloxacin hcl) .Marland Kitchen.. 1 by  mouth two times a day  Other Orders: T-Urine Culture (Spectrum Order) [id number]) TLB-Udip w/ Micro (81001-URINE)  Patient Instructions: 1)  Extend Cipro 500mg  two times a day for 5 days 2)  Increase fluids  3)  Empty bladder often and well.  4)  follow up Dr. Sherene Sires as planned  5)  Please contact office for sooner follow up if symptoms do not improve or worsen  Prescriptions: DIOVAN HCT 160-12.5 MG TABS (VALSARTAN-HYDROCHLOROTHIAZIDE) 1 by mouth two times a day  #60 x 5   Entered and Authorized by:   Rubye Oaks NP   Signed by:   Rubye Oaks NP on 09/16/2010   Method used:   [facility]        [facility]  [address] [phone]* (retail)       [address]       [place], [place]  [id number]       Ph: [phone] or [phone]       Fax: [phone]   RxID:   [id number] CIPRO 500 MG TABS (CIPROFLOXACIN HCL) 1 by mouth two times a day  #10 x 0   Entered and Authorized by:   Rubye Oaks NP   Signed by:   Rubye Oaks NP on 09/16/2010   Method used:   [facility]        [facility]  [address] [phone]* (retail)       [address]       [place], [place]  [id number]  Ph: [phone] or [phone]       Fax: [phone]   RxID:   [id number]

## 2010-10-07 NOTE — Progress Notes (Signed)
Summary: prescription  Phone Note Call from Patient   Caller: Patient Call For: Dr Sherene Sires Summary of Call: Patient phoned she states that she has a UTI that just won't go away. She was given medication by the doctor on call over the weekend and she has completed the prescription but the UTI seems to continue holding on. [facility] [address] Patient can be reached [phone] Initial call taken by: Vedia Coffer,  September 15, 2010 2:57 PM  Follow-up for Phone Call        Spoke with pt and sched appt with TP for tommorrow am at 11 am. Follow-up by: Vernie Murders,  September 15, 2010 3:42 PM

## 2010-10-07 NOTE — Miscellaneous (Signed)
Summary: phone call for UTI  Clinical Lists Changes phone call this w/e for worsening dysuria, no fever though.  Had uti in may 2011 with kleb...treated with cipro.  Will call this in for her again. Medications: Added new medication of CIPROFLOXACIN HCL 250 MG TABS (CIPROFLOXACIN HCL) one am and pm - Signed Rx of CIPROFLOXACIN HCL 250 MG TABS (CIPROFLOXACIN HCL) one am and pm;  #10 x 0;  Signed;  Entered by: Barbaraann Share MD;  Authorized by: Barbaraann Share MD;  Method used: [facility] [address] [phone]*, [address], [place], [place], [place]  [id number], Ph: [phone] or [phone], Fax: [phone]    Prescriptions: CIPROFLOXACIN HCL 250 MG TABS (CIPROFLOXACIN HCL) one am and pm  #10 x 0   Entered and Authorized by:   Barbaraann Share MD   Signed by:   Barbaraann Share MD on 09/11/2010   Method used:   [facility]        [facility] [address] [phone]* (retail)       [address]       [place], [place]  [id number]       Ph: [phone] or [phone]       Fax: [phone]   RxID:   [id number]

## 2010-12-28 ENCOUNTER — Encounter: Payer: Self-pay | Admitting: Internal Medicine

## 2011-01-11 ENCOUNTER — Other Ambulatory Visit: Payer: Self-pay | Admitting: Adult Health

## 2011-01-14 NOTE — Telephone Encounter (Signed)
Pt overdue for appt w/ Dr Sherene Sires

## 2011-01-18 NOTE — Assessment & Plan Note (Signed)
[facility]                             PULMONARY OFFICE NOTE   NAME:[name]                     MRN:          [id number]  DATE:02/27/2007                            DOB:          03-Dec-1928    HISTORY OF PRESENT ILLNESS:  The patient is a 77 year old African-  American female patient of Dr. Thurston Hole who has a known history of  hypertension and diabetes, returns today for a 2-week followup.  Last  visit the patient had been having increased blood sugars ranging from  170 to 280.  Glucovance 5/500 was added to her present regimen.  She  returns today to review all of her medications.  The patient's  medications are correct with our medication list.  The patient also was  having some difficulty with her left shoulder and was referred over to  orthopedics.  The patient does report she received a steroid injection,  which has substantially helped her shoulder.  The patient denies any  chest pain, shortness of breath, polyuria, polydipsia, or leg swelling.   PAST MEDICAL HISTORY:  Reviewed.   CURRENT MEDICATIONS:  Reviewed.   PHYSICAL EXAM:  The patient is a pleasant female in no acute distress.  She is afebrile with stable vital signs.  HEENT:  Unremarkable.  NECK:  Supple without adenopathy.  No JVD.  LUNGS:  Sounds are clear.  CARDIAC:  Regular rate.  ABDOMEN:  Soft and nontender.  EXTREMITIES:  Warm without any edema.   LABORATORY WORK:  On February 13, 2007 revealed a hemoglobin of 13.4,  potassium 4.3, BUN and creatinine 11 and 0.7 respectively.  Blood sugar  was 172.   IMPRESSION AND PLAN:  1. Diabetes mellitus with recent elevated blood sugars.  The patient      has recently been added on Glucovance 5/500 one-half tablet daily.      Will continue on this present regimen along with Glucotrol XL 5 mg      twice daily.  She is advised on weight loss, dietary issues.  We      will check an A1c today and follow up accordingly.  2. Complex  medication regimen.  The patient's medications are reviewed      in detail.  Patient education was provided.  A computerized      medication calendar was adjusted accordingly and reviewed with this      patient.  3. Hypertension.  Recently diovan was increased up to 160 mg.  The      patient's blood pressure is still suboptimally controlled.  The      patient is once again recommended on a low sodium diet, weight      loss.  Will continue to monitor this.  Recheck here in 4 weeks.  If      it is still      elevated, may need to add in a diuretic to her present regimen.  4. Left shoulder pain.  Continue followup with orthopedics as      recommended.      Rubye Oaks, NP  Electronically Signed  [name]. Sherene Sires, MD, [facility]  Electronically Signed   TP/MedQ  DD: 02/28/2007  DT: 02/28/2007  Job #: [id number]

## 2011-01-18 NOTE — Assessment & Plan Note (Signed)
[facility]                             PULMONARY OFFICE NOTE   NAME:[name], [name]                     MRN:          [id number]  DATE:08/09/2007                            DOB:          [birth year]    PRIMARY SERVICE COMPREHENSIVE HEALTHCARE EVALUATION   CHIEF COMPLAINT:  Followup diabetes, hypertension and hyperlipidemia.   HISTORY:  This is a very nice 77 year old black female with morbid  obesity complicated by diabetes, hyperlipidemia and hypertension as well  as chronic fatigue.  She has made no significant headway in terms of  weight loss towards her stated goal of 146, maintaining around 156  pounds over the last several years.  However, she denies any exertional  chest pain, transient ischemic attack, claudication symptoms, orthopnea,  PND or leg swelling, overt symptoms of polyuria or polydipsia.   PAST MEDICAL HISTORY:  1. Hypertension.  2. Chronic fatigue and depression.  3. Chronic right lumbar radiculopathy, responsive to Aleve.  4. Morbid obesity with target weight 146.  5. Hyperlipidemia with target LDL of less than 100 based on the fact      that she is diabetic.  6. Degenerative joint disease.  7. Chronic cough felt to be secondary to allergic rhinitis, followed      by Dr. Fannie Knee.  8. Peripheral vascular disease.  See arterial Doppler dated August 17, 2005.  There is mild reduction on the right, normal left study.   ALLERGIES:  None known.   MEDICATIONS:  Taken in detail on the work sheet and correct in the  column dated August 09, 2007.   SOCIAL HISTORY:  She quit smoking 21 years ago.  She denies any alcohol  use.   FAMILY HISTORY:  Positive for hypertension, diabetes in her children and  diabetes in her mother. Breast cancer in her sister.  Stroke in her  father at age 41.   REVIEW OF SYSTEMS:  Taken in detail on the work sheet and significant  for the problems as outlined above.   Note that her GYN  care is through Dr. Marcelle Overlie.   PHYSICAL EXAMINATION:  GENERAL APPEARANCE:  This is a somber, but not  overtly depressed, ambulatory black female in no acute distress.  VITAL SIGNS:  Blood pressure 136/86.  Height is 5 feet, 2-1/2 inches, no  change from a year ago, weight is unchanged as well.  HEENT:  Limited ocular exam revealed no significant retinal or arterial  change.  Oropharynx is clear.  Full set of dentures.  Ear canals are  clear bilaterally.  Trachea is midline.  NECK:  Supple without cervical adenopathy or tenderness.  No  thyromegaly.  LUNGS:  Lung fields were clear to auscultation and percussion  bilaterally.  HEART:  Has regular rate and rhythm without murmurs, rubs or gallops.  Nondisplaced PMI.  Carotid upstrokes are brisk without any bruits.  ABDOMEN:  Soft, obese but benign with no palpable organomegaly, masses  or tenderness.  Femoral pulses were present bilaterally with no bruits.  GENITOURINARY/RECTAL:  Per Dr. Richarda Overlie.  EXTREMITIES:  Warm without  calf tenderness, cyanosis, clubbing or edema.  MUSCULOSKELETAL:  Completely within normal limits.  NEUROLOGICAL:  No focal deficits, pathologic reflexes.  Her deep tendon  reflexes did not appear to be diminished distally.   LABORATORY DATA:  Fasting blood sugar was 172.  Total cholesterol was  242 with LDL of 156.  Hemoglobin A1C was 8.1.  TSH was normal.  Urinalysis revealed no protein.   IMPRESSION:  1. Poorly controlled diabetes complicated by peripheral vascular      disease which is asymptomatic at present.  We need to consider      placing her therefore on a STATIN when she returns (Record      indicates they were stopped a year ago because of possible      INTOLERANCE and/or ALLERGY and rechallenging could be considered.      In the meantime, she will return within the next week for      adjustment of her Glyburide/metformin to higher levels to achieve a      hemoglobin A1C below 7.0 and a fasting  blood sugar below 125, if      possible.  2. Morbid obesity.  She needs to lose about 10 more pounds to improve      her diabetes control as well as her hyperlipidemia.  Perhaps a      formal referral to nutrition should be considered, if not already      done.  3. Tendency to depression and fatigue, presently maintained on Zoloft.  4. Hypertension.  This is adequately controlled on her present      regimen.  5. GYN health maintenance per Dr. Richarda Overlie.  6. Allergic rhinitis on p.r.n. Benadryl.  I believe she would benefit      from using Nasonex more as a maintenance drug since she has more      chronic symptoms and using Afrin for five days p.r.n. acute flare.   FOLLOWUP:  Followup will be within one week to discuss the results of  her studies and consideration for rechallenging of her Statin therapy  (perhaps Crestor since it does not appear that she has tried this  version of Statin's).     [name]. Sherene Sires, MD, [facility]  Electronically Signed    MBW/MedQ  DD: 08/13/2007  DT: 08/13/2007  Job #: [id number]

## 2011-01-18 NOTE — Assessment & Plan Note (Signed)
[facility]                             PULMONARY OFFICE NOTE   NAME:[name], [name]                     MRN:          [id number]  DATE:05/24/2007                            DOB:          [birth year]    HISTORY:  A 77 year old black female with long-standing diabetes and  hypertension seen on June 24 by our nurse practitioner with a hemoglobin  A1c of 10.4 and it was recommended at that time she be switched from  Glucotrol to Glucovance 5/500 one b.i.d.  Specifically, she was told to  stop taking Glucotrol, but when she came back today with a medication  calendar that she did not update with the changes and has actually found  on her own that the best combination for her was the Glucovance 5/500  one in the morning and 1/2 in the evening with adequate control of her  finger sticks (typically in the 120-140 range and no hypoglycemic  episodes).  She denies any change in her weight, fevers, chills, sweats,  polydipsia, polyuria.   I reviewed her medicines with her, but note they do not correlate with  the calendar she has just been provided, see column dated May 24, 2007 for full inventory.  Note, she was taking the aspirin 81 mg  nightly.   PHYSICAL EXAMINATION:  She is a pleasant, ambulatory black female in no  acute distress.  VITAL SIGNS:  Stable vital signs.  Blood pressure 148/76.  HEENT:  Unremarkable.  Oropharynx is clear.  LUNGS:  The lung fields are completely clear bilaterally to auscultation  and percussion.  CARDIAC:  Regular rate and rhythm without murmur, gallop, or rub.  ABDOMEN:  Soft and benign.  EXTREMITIES:  Warm without calf tenderness, cyanosis, clubbing, or  edema.   IMPRESSION:  1. Diabetes mellitus clinically well-controlled on her present revised      regimen.  We will check a fasting blood sugar today as well as a      hemoglobin A1c.  2. Complex medical regimen.  I spent almost 30 minutes with this  patient today going over her medicines to make sure we were all      reading from the same page.  She does not yet get the message      that we need to have an accurate way of reconciling what she takes      against what we are asking her to take.  I modified her present      calendar and reviewed with her the nature of a maintenance versus a      p.r.n. in more detail to reinforce what she had already been told      by our nurse practitioner.   Followup will be in 3 months for comprehensive health care evaluation.  Will see her sooner if needed.     [name]. Sherene Sires, MD, [facility]  Electronically Signed    MBW/MedQ  DD: 05/24/2007  DT: 05/24/2007  Job #: [id number]

## 2011-01-21 NOTE — Assessment & Plan Note (Signed)
[facility]                             PULMONARY OFFICE NOTE   NAME:Maddox, Denise BEJARANO                     MRN:          [id number]  DATE:08/16/2006                            DOB:          Dec 15, 1928    HISTORY OF PRESENT ILLNESS:  The patient is a 77 year old African-  American female patient of Dr. Thurston Hole who has a known history of  hypertension, diabetes mellitus, and hyperlipidemia, presents for a 2-  week followup.  The patient presents today to review her medications,  which are correct with our medication list except for a recent change of  her diabetic medications from Amaryl over to Glucotrol.  The patient  reports she has recently stopped Amaryl and changed back over to  Glucotrol.  The patient has had significant difficulties with urticaria  and hives over the last 7-8 months and has been slowly changing  medications to see if these were causing her hives.  She has recently  come off Amaryl 6 weeks and hives have totally resolved.  The patient  also underwent blood work last visit.  CBC, CMET, TSH, BNP were all  essentially unremarkable except for an elevated blood sugar of 167.  The  patient's total cholesterol was 231, LDL was 161.  The patient's LDL  goal was less than 70.  The patient has felt that she was intolerant to  statin therapy due to her difficulties with urticaria over the last 9  months and has been off of statin therapy since then.   PAST MEDICAL HISTORY:  Reviewed.   CURRENT MEDICATIONS:  Reviewed.   PHYSICAL EXAMINATION:  GENERAL:  The patient is a pleasant female in no  acute distress.  VITAL SIGNS:  She is afebrile with stable vital signs.  HEENT:  Unremarkable.  NECK:  Supple without adenopathy, no JVD.  LUNG SOUNDS:  Clear.  CARDIAC:  Regular rate.  ABDOMEN:  Soft without any hepatosplenomegaly.  EXTREMITIES:  Warm without any calf tenderness, cyanosis, clubbing, or  edema.   IMPRESSION AND PLAN:  1. Morbid  obesity complicated by diabetes mellitus.  The patient will      be referred to nutritionist today.  A1c is pending at time of      dictation.  The patient has been changed over to Glucotrol XL 5 mg      daily.  Has been questionable intolerant to AMARYL due to possible      hives.  2. Hyperlipidemia with a target LDL goal of less than 70.  The      patient, as above, will be referred to nutritionist.  Will work on      diet and exercise.  Will recheck in 3 months.  If still not at      goal, will rechallenge with statin therapy and/or refer to lipid      clinic.  3. Hypertension,  marginally controlled on her present regimen.  Will      continue to monitor closely.  4. Complex medication regimen.  The patient's medications were      reviewed in detail.  Patient  education was provided.  The patient's computerized medication      calendar was adjusted accordingly.      Rubye Oaks, NP  Electronically Signed      [name]. Denise Sires, MD, [facility]  Electronically Signed   TP/MedQ  DD: 08/16/2006  DT: 08/16/2006  Job #: [id number]

## 2011-01-21 NOTE — Letter (Signed)
July 03, 2006    Gypsy Kellogg  [address]  [place], [place]  [address]   RE:  TASHE, PURDON  MRN:  [id number]  /  DOB:  [birth year]   Dear Ms. Hampshire:   My records indicate that you had scheduled a comprehensive health care  evaluation today, October 29th.  Chart review indicates that this is not  actually due until November 28th.  Therefore, please excuse this letter if  the appointment was made in error and reschedule it on August 02, 2006.   However, the purpose of the letter is to let you know that we reserve an  entire 30 minutes time slot for patient's for comprehensive evaluations so  this time is not available for another patient to use, because we had  reserved it for you.  If for any reason you are not able to keep the  appointment in November for your evaluation, please contact us at your  earliest convenience so we can offer this time slot to another patient.   Respectively yours.    Sincerely,     ______________________________  [name]. Sherene Sires, MD, [facility]    MBW/MedQ  DD: 07/03/2006  DT: 07/03/2006  Job #: [id number]

## 2011-01-21 NOTE — Assessment & Plan Note (Signed)
[facility]                               PULMONARY OFFICE NOTE   NAME:Lembcke, NAKEA GOUGER                     MRN:          [id number]  DATE:03/23/2006                            DOB:          24-Apr-1929    This is a 77 year old black female with new onset of pruritus with hives in  April of 2007 with multiple medication changes.  Since then no improvement  in her itching.  She takes Hydroxyzine maybe once or twice a day plus  p.r.n. Tylenol but we have been gradually reducing the medicines she takes  including eliminating Actos and give her Amaryl on her previous visit.  She  denies any sneezing, wheezing, dyspnea, or change in bowel or bladder  habits.   MEDICATIONS:  Please see face sheet dated 03/23/06.  Presently medications  consist of Zoloft 100 mg one half q. a.m. and 1 q. p.m., aspirin 81 (she  still takes it at bedtime because she was on Niaspan at bedtime previously).  The Diovan and Niaspan have been discontinued.  She is on Zocor 40 mg at  bedtime, Centrum Silver 1 daily, Nasonex one to two puffs b.i.d., Benicar 20  mg one half q. day, and Amaryl 4 mg q. a.m.   PHYSICAL EXAMINATION:  GENERAL:  She is an obese, ambulatory, black female  in no acute distress.  VITAL SIGNS:  Stable vital signs.  Her blood pressure was 110/60 and pulse  rate was 86.  HEENT:  Remarkable for oropharynx clear.  Dentition is intact.  NECK:  Supple without cervical adenopathy.  Trachea is in the midline.  LUNGS:  Lung fields are clear bilaterally on auscultation with no wheezing.  HEART:  Regular rhythm without murmurs, rubs, or tachycardia.  ABDOMEN:  Soft and benign.  EXTREMITIES:  Calf tenderness.  No clubbing, cyanosis, or edema.  SKIN:  Classic hives over the upper extremities.   IMPRESSION AND PLAN:  New onset hives of unclear etiology.  I assured the  patient that this is probably benign and noted that most the time this is  idiopathic and not related  to identifiable allergy.  The one medication  category we have not eliminated is ARB's and so I am going to stop Benicar  in favor of Sular 10 mg q. a.m. noting the blood pressure is on the low side  of therapeutic today.   All of her medications were reviewed with her in medication calendar format.  I have arranged for her to see Dr. Terri Piedra as soon as possible and asked her  to show Dr. Terri Piedra the schedule of her medicines and suggested that she may  need an empiric trial off from any medicine that Dr. Terri Piedra deems  appropriate.  If Dr. Terri Piedra feels allergy evaluation is appropriate, we can  certainly  do that here in this clinic.  Otherwise, we will see the patient back in  four weeks to re-check her blood pressure and see whether Jesusita Oka is adequate.  [name]. Sherene Sires, MD, [facility]   MBW/MedQ  DD:  03/23/2006  DT:  03/24/2006  Job #:  [id number]   cc:   Gelene Mink A. Worthy Rancher, MD

## 2011-01-21 NOTE — Assessment & Plan Note (Signed)
[facility]                             PULMONARY OFFICE NOTE   NAME:[name]                     MRN:          [id number]  DATE:11/09/2006                            DOB:          Nov 09, 1928    HISTORY:  A 77 year old black female with obesity complicated  hypertension, hyperlipidemia, and diabetes who returns for followup with  no significant complaints.  She does note that her blood sugars have  been as high as 170 fasting on Glucotrol 5 mg one q.a.m.  She denies any  polydipsia or polyuria.  She has been to see the nutritionist but has  not been back with a food diary as requested yet.  She denies any  exertional chest pain, orthopnea, PND, TIA, or claudication symptoms.   PHYSICAL EXAMINATION:  GENERAL:  She is a somber but not overtly  depressed-appearing ambulatory black female in no acute distress.  VITAL SIGNS:  She had vital signs with blood pressure 190/90 and pulse  rate of 86.  HEENT:  Unremarkable, oropharynx clear, no evidence of thrush.  NECK:  Supple without cervical adenopathy or tenderness, trachea is  midline, no thyromegaly.  LUNG FIELDS:  Completely clear bilaterally to auscultation and  percussion.  HEART:  There is a regular rate and rhythm without murmur, gallop or  rub.  ABDOMEN:  Soft, benign.  EXTREMITIES:  Warm without calf tenderness, cyanosis, clubbing, or  edema.   IMPRESSION:  Extended discussion with the patient regarding the  following issues:  1. Hypertension is not adequately controlled on Diovan 80 mg one      daily.  Note that she does have a history of gout and therefore I      am going to recommend increasing the Diovan to 160 rather than      adding a diuretic at this point, and gave her samples for 4 weeks.  2. Diabetes is not yet under adequate control by her fasting blood      sugars.  I have asked her to continue to check these at least a      couple of times of week and increase Glucotrol  up to 5 mg b.i.d. as      well as present back to her nutritionist with a food diary for      feedback regarding what she is doing right and what she is wrong.      In the meantime, continue to work on more consistent exercise      program to improve calorie balance.  3. Finally, she does have a nonspecific rhinitis, either viral or      allergic.  No evidence of bacterial infection.  I therefore      reviewed with her both her maintenances and her p.r.n.'s in the      context of how she can treat a common cold without the need of any      additional medications (which includes the p.r.n. use of Afrin,      Nasonex, Mucinex DM).   I spent an extra 25 minutes with this patient going over each and  every  one of these details today in writing and followup will be in 3 months,  sooner if needed.     [name]. Denise Sires, MD, [facility]  Electronically Signed    MBW/MedQ  DD: 11/09/2006  DT: 11/09/2006  Job #: [id number]

## 2011-01-21 NOTE — Assessment & Plan Note (Signed)
[facility]                               PULMONARY OFFICE NOTE   NAME:Maddox, Denise CARAVELLO                     MRN:          [id number]  DATE:04/20/2006                            DOB:          03/04/1929    HISTORY:  A 77 year old black female with new onset hives in April of 2007.  By trial and error we have switched out all of her antihypertensives and her  oral hypoglycemics with no apparent impact on her hives.  Frederick A.  Terri Piedra, MD has seen her and confirmed the diagnosis of hives with a skin  biopsy and recommended consideration for allergy testing.  The patient  denies itching, sneezing or wheezing or obvious triggering factors.  She is  50% better taking fexofenadine daily with p.r.n. use of Hydroxyzine about  1 to 2 pills daily.  That is the same dose, same strength that she was  taking previously, same frequency as previously but apparently taking a 25  instead of a 50 mg tablet.   For further information on medication please see face sheet dated  04/20/2006.   Patient continues to struggle with the use of the medication calendar  despite the fact that it is very user friendly and also unambiguous  regarding medication in this patient.   PHYSICAL EXAMINATION:  GENERAL:  She is a depressed ambulatory black female  with somewhat of a hopeless, helpless affect and attitude.  VITAL SIGNS:  Stable.  HEENT:  Unremarkable.  Nares clear.  LUNGS:  Lung fields completely clear bilaterally to auscultation and  percussion.  HEART:  Regular rhythm without murmur or rub.  ABDOMEN:  Soft benign.  EXTREMITIES:  Warm and dry with no calf tenderness, no cyanosis, clubbing or  edema.  There were no active lesions today.   IMPRESSION:  Idiopathic hives, unclear etiology.  The patient is convinced  it is the Zocor and I think it is fine to stop the Zocor for four to six  weeks to see if there is any pattern in her itching or hives or need for  back  up hydroxyzine.  For now she is to continue the fexofenadine 180 mg  daily.   I do believe allergy testing is warranted and asked the patient to return to  see Dr. Maple Hudson but hold all antihistamines for at least 48 hours before  testing.   I have arranged follow-up with our nurse practitioner for full medication  reconciliation and also will see her back here after that for comprehensive  health care evaluation which is due this fall.                                  [name]. Denise Sires, MD, [facility]   MBW/MedQ  DD:  04/20/2006  DT:  04/20/2006  Job #:  [id number]

## 2011-01-21 NOTE — Assessment & Plan Note (Signed)
[facility]                               PULMONARY OFFICE NOTE   NAME:[name], [name]                     MRN:          [id number]  DATE:05/15/2006                            DOB:          12-Aug-1929    HISTORY OF PRESENT ILLNESS:  Patient is a 77 year old African-American  female patient of Dr. Thurston Hole who presents for an acute office visit.  Patient has had a 88-month history of pruritus and urticaria, has recently  had new onset of pruritus and urticaria since April of this year.  She has  recently been evaluated by Dr. Maple Hudson on May 05, 2006 and diagnosed with  subacute and chronic urticaria.  She was given a prednisone taper and  started on hydroxyzine.  Patient returns today complaining that this did not  relieve her hives or pruritus, and this morning she had significant swelling  along the upper lip with a few hives along the right side of her face and  mouth.  Patient denies any difficulty swallowing, wheezing, shortness of  breath, chest pain.  She has now finished her prednisone taper which she  reports had essentially no help on her hives.   PAST MEDICAL HISTORY:  Reviewed.   CURRENT MEDICATIONS:  Reviewed.   PHYSICAL EXAMINATION:  GENERAL:  Patient is a pleasant black female in no  acute distress.  She is afebrile with stable vital signs.  VITAL SIGNS:  O2 saturation is 98% on room air.  HEENT:  Posterior pharynx is clear without any exudate, swelling or redness.  Along the upper lip, it is swollen.  There are no hives noted along the  face, no hives along the face.  Patient has a small papule along the right  corner of the right upper lip.  Oral mucosa is pink and moist.  NECK:  Supple without adenopathy.  LUNGS:  Sounds are clear without any wheezing or crackles.  CARDIAC:  Regular rate and rhythm.  ABDOMEN:  Soft and benign.  EXTREMITIES:  Without any edema.  SKIN:  Does reveal a few scattered hives along the arms.   IMPRESSION/PLAN:  Acute reaction with a patient with underlying subacute on  chronic urticaria.  Patient was given Depo Medrol 120 IM injections today in  August.  She may use Doxepin 25 mg starting at bedtime as needed.  She will  discontinue Periactin.  Patient does have a follow-up appointment tomorrow  with Dr. Maple Hudson for possible allergy testing.  I will discuss this and also  followup to determine if the lip swelling has resolved.  I have discussed  this case in detail with Dr. Maple Hudson and may need to refer patient to Dr. Rosita Fire  at [facility] for further evaluation and treatment options.                                  Rubye Oaks, NP  [name]. Sherene Sires, MD, [facility]   TP/MedQ  DD:  05/15/2006 DT:  05/16/2006 Job #:  [id number]

## 2011-01-21 NOTE — Assessment & Plan Note (Signed)
[facility]                               PULMONARY OFFICE NOTE   NAME:[name], LARESHA BACORN                     MRN:          [id number]  DATE:05/16/2006                            DOB:          1929/05/29    PULMONARY ALLERGY FOLLOWUP:   PROBLEMS:  1. Urticaria, subacute.  2. Allergic rhinitis.   HISTORY OF PRESENT ILLNESS:  I had seen her August 31.  We had suggested  some home environmental changes, especially with her cleaning materials,  retried her prednisone taper and given her trial of Periactin.  She then saw  the nurse practitioner on September 10.  After discussion with me, she was  given doxepin 25 mg.  We discussed the possibility of a new assay for anti-  IGE being offered at [facility] and we discussed offering the patient  referral to the Pulmonary Allergy Division at [facility] where she  might be considered for a trial of immunosuppressive therapy.  The patient  does not want to go to [place] at this point.  I talked with her briefly  about cyclosporine.  She is experiencing an increase in seasonal rhinitis  complaints now and says this is typical for the time of year.  Her urticaria  continues.  She dropped off of all antihistamine medications four days ago.  The steroid tapers have not helped.  She wants to go forward with allergy  skin testing.  I explained that allergy skin testing usually is not helpful  for urticaria, but I would have some useful information related to her  seasonal rhinitis, depending on how much of a problem that really is.   OBJECTIVE:  VITAL SIGNS:  Weight 154 pounds, blood pressure 136/78, pulse  regular at 63, room air saturation 98%.  GENERAL:  Two small urticarial lesions on her arm.  HEENT:  Nose, throat and lung fields all clear.  HEART:  Sounds regular without murmur.  SKIN:  Positive histamine and negative diluent controls.  Negative food  screen.  Significant positive intradermal  reactions for a number of weed,  grass and tree pollens, and especially for house dust and dust mite, dog and  cat.  She has a dog but no cat currently.   IMPRESSION:  1. Allergic rhinitis, skin test positive, seasonal.  2. Subacute urticaria, probably not related to extrinsic atopic exposures.   PLAN:  1. Again, offered referral to [facility] for      consultation concerning her urticaria with anticipation they would      offer immunosuppressive therapy.  2. Consider possibility of offering the cyclosporine therapy here.  3. Keep appointment with Dr. Sherene Sires.  4. Use high dose antihistamines as helpful.                                   Clinton D. Maple Hudson, MD, FCCP, FACP   CDY/MedQ  DD:  05/17/2006  DT:  05/18/2006  Job #:  [id number]

## 2011-01-21 NOTE — Assessment & Plan Note (Signed)
[facility]                               PULMONARY OFFICE NOTE   NAME:[name], [name]                     MRN:          [id number]  DATE:05/05/2006                            DOB:          Apr 24, 1929    PROBLEM:  Allergy consultation at the kind request of Dr. Sherene Sires for  evaluation of this 77 year old African-American woman with urticaria.   HISTORY:  She experienced new onset of pruritus and urticaria in April of  this year.  Fifteen or 20 years ago, she was on allergy vaccine through this  office for hay fever symptoms, but she has had no prior allergic skin  disorder.  She has been tried through appropriate medication changes,  working with Dr. Sherene Sires and with Dr. Terri Piedra.  Both physicians drew lab work.  She is aware of no new exposure or circumstance that might have triggered  these hives.  Dr. Terri Piedra had done a skin biopsy and suggested allergy  evaluation.  Fexofenadine 180 mg has been insufficient help, and  hydroxyzine, up to 50 mg q.i.d., has not been much help.  She notices new  lesions coming and old ones fading at a frequency of about every 48-72  hours.  Involvement has been generalized and is now beginning to involve her  face, where it was not before, otherwise, there has been no relation to  pressure areas, sun exposed areas, etc.  She had not changed her soaps and  cleansers.  She does tell me that she is using Bounce fabric softener sheets  and a detergent product likely to contain a bacterial brightening and  whitening enzyme.  I have asked her to stop those.   REVIEW OF SYSTEMS:  Weight has been stable.  She has had no fever,  adenopathy, change in bowel or bladder, obvious new or hot joints, other  rash, bleeding, or purulent discharge.   MEDICATIONS:  Zoloft 100 mg, aspirin 81 mg, glimepiride 4 mg, Centrum  Silver, Diovan, fexofenadine, occasional use of Aleve, Afrin, Nasonex,  Mucinex DM, hydroxyzine 50 mg q.4h. p.r.n., and  Tylenol.   DRUG INTOLERANCE:  ALBUTEROL.   I had some difficulty tracking her medication changes, but it looks as if  Zoloft and aspirin may have been consistent.   PAST MEDICAL HISTORY:  1. Hypertension.  2. Diabetes.  3. Elevated cholesterol.  4. Allergic rhinitis.   No history of lupus, rheumatoid arthritis, or other recognized autoimmune  disease, thyroid disease, cancer, or asthma.  No intolerance to latex,  contrast dye, or iodine.   She last took prednisone 6-7 months ago and does not remember that it was  helpful for this process.   SOCIAL HISTORY:  Quit smoking 20 years ago.  Divorced.  Lives alone.  Grown  children.  Retired.  She has had a dog for eight years.   FAMILY HISTORY:  Father had heart disease.  Son recently had a blood clot  disorder.  Sister with cancer and arthritis.   OBJECTIVE:  VITAL SIGNS:  Weight 156 pounds.  BP 132/74, pulse regular at  78.  Room air saturation  98%.  GENERAL:  This is a somewhat uncomfortable-appearing woman with obvious  generalized urticarial lesions on her forehead and arms.  Not much evidence  of excoriation.  I find no adenopathy.  HEENT:  Conjunctivae are clear.  Her nose and throat are clear.  NECK:  Thyroid is not enlarged.  There is no neck vein distention.  LUNGS:  Lung fields are very clear.  HEART:  Heart sounds are regular without murmur or gallop.  ABDOMEN:  No enlargement of the liver or spleen.  EXTREMITIES:  No clubbing, cyanosis or edema.   IMPRESSION:  Urticaria, subacute, reaching length of time criteria to fall  into the chronic category.  There are still directions to explore.  A  significant fraction of these may turn out to represent an autoimmune  situation triggering histamine release from mast cells, unrelated to any  extrinsic factor.  Most chronic urticaria eventually fades, sometimes after  years, and usually without a specific cause understood.  If we cannot  manipulate this to her advantage with  steroids and antihistamines, then  therapy with cyclosporin could be considered.  I am not clear if her lab  evaluation has sought evidence of antithyroid antibodies or chronic  hepatitis or lupus.   PLAN:  1. As initial measures today, I have asked her to change her detergent to      Drift and stop use of the Bounce fabric softener sheets.  2. Retry prednisone eight day taper from 40 mg.  3. Periactin 4 mg nightly instead of hydroxyzine, cautioning her that it      will cause daytime somnolence.  4. Schedule return in two weeks.  We will consider allergy skin testing,      although yield may not be high and the skin test may in turn trigger      urticaria.  Consider doxepin as an H1 and H2 antihistamine.   I hope I can be of some help to this unfortunate woman.  Appreciate the  chance to meet her.                                  Clinton D. Maple Hudson, MD, FCCP, FACP   CDY/MedQ  DD:  05/06/2006  DT:  05/08/2006  Job #:  [id number]   cc:   [name]. Sherene Sires, MD, FCCP  Frederick A. Worthy Rancher, MD

## 2011-01-21 NOTE — Assessment & Plan Note (Signed)
[facility]                               PULMONARY OFFICE NOTE   NAME:[name], RENAD JENNIGES                     MRN:          [id number]  DATE:05/04/2006                            DOB:          [birth year]    HISTORY OF PRESENT ILLNESS:  The patient is a 77 year old African-American  female patient of Dr. Thurston Hole who has a known history of hypertension,  hyperlipidemia and diabetes mellitus.  The patient has been having  difficulty over the past four months with hives.  She has had multiple  medication changes with her medications to determine if they were  contributing to her hives.   She has been discontinued off of Diovan, Benicar, Sular and Simvastatin  without any improvement in her hives.  She has been seen at her  dermatologist, Dr. Terri Piedra, with confirmation of diagnosis of hives but has  not improved with Allegra and hydroxyzine.  She does have an appointment  with Dr. Maple Hudson for allergy testing in the next two weeks.  The patient  recently stopped her Simvastatin and has not had any improvement in her  hives.   The patient denies any difficulty swallowing, shortness of breath, chest  pain, orthopnea, PND or leg swelling.  The patient has brought all of her  medications in today for review for a new medication calendar adjustment due  to the multiple medication changes recently.   PHYSICAL EXAMINATION:  GENERAL:  The patient is a pleasant female in no  acute distress.  VITAL SIGNS:  She is afebrile with stable vital signs.  HEENT:  Unremarkable.  Posterior oropharynx clear.  NECK:  Supple without adenopathy.  LUNGS:  Lung sounds are clear without any wheezing or rhonchi.  CARDIAC:  Regular rate and rhythm.  ABDOMEN:  Soft.  EXTREMITIES:  Warm without any cyanosis, clubbing or edema.  SKIN:  The patient does have several scattered urticarial lesions along the  upper chest wall and arms.   IMPRESSION AND PLAN:  1. Idiopathic hives.  The  patient will follow up with Dr. Maple Hudson as      scheduled in two weeks or sooner if needed.  May continue to use      hydroxyzine as needed.  2. Complex medication regimen.  The patient's medications were reviewed in      detail.  The patient's medication calendar was adjusted accordingly and      the patient was given a new copy along with patient education.  3. Hypertension, currently controlled on her current regimen.  The patient      is currently maintained on Diovan 80 mg daily.  4. Hyperlipidemia.  The patient is currently off her Simvastatin with an      LDL goal of 100.  We will need to readdress once her allergy testing is      completed and probably will need to restart to keep her LDL goal target      of less than 100.   The patient will follow back up with Dr. Sherene Sires in six to eight weeks or  sooner if needed.  Rubye Oaks, NP                                [name]. Sherene Sires, MD, [facility]   TP/MedQ  DD:  05/04/2006  DT:  05/05/2006  Job #:  [id number]

## 2011-01-21 NOTE — Assessment & Plan Note (Signed)
[facility]                             PULMONARY OFFICE NOTE   NAME:[name]                     MRN:          [id number]  DATE:08/02/2006                            DOB:          [birth year]    HISTORY:  This is a 77 year old black female with morbid obesity  complicated by hypertension, diabetes, hyperlipidemia and chronic  fatigue.  She has really not made any significant headway in terms of  losing weight toward her stated ideal weight of 146 over the last year.  She complains of dyspnea with exertion, but is not doing any kind of  regular physical activity to push the envelope in terms of aerobics.  She denies, however, any worsening dyspnea over the last year,  exertional chest pain, orthopnea, PND or significant leg swelling.  She  also denies any overt symptoms of hypoglycemia.   PAST MEDICAL HISTORY:  1. Hypertension.  2. Chronic fatigue and depression.  3. Chronic right lumbar radiculopathy, responsive to Aleve.  4. Morbid obesity, complicated by diabetes mellitus as noted above.  5. Hyperlipidemia, target LDL of less than 100 based on the fact that      she is diabetic.  6. DJD.  7. Chronic cough, felt to be due to postnasal drip syndrome,      responsive to nasal steroids.   ALLERGIES:  None known.   MEDICATIONS:  Taken in detail on the work sheet column dated August 02, 2006.   SOCIAL HISTORY:  She quit smoking over 20 years ago.  She denies any  alcohol use.   FAMILY HISTORY:  Positive for hypertension, diabetes in her children and  diabetes in her mother, breast cancer in her sister, stroke in her dad  at the age of 13.   REVIEW OF SYSTEMS:  Taken in detail and significant for the problems as  outlined above.  She does have positional pain in the left greater than  right shoulder without pleuritic or exertional features, and improves  with Aleve.   PHYSICAL EXAMINATION:  This is a morbidly obese black female  weighing  156 pounds, is up 2 pounds from a year ago.  She is afebrile, stable vital signs, with no change in her height of 5  feet 2-1/2 inches.  HEENT:  Limited ocular exam reveals no obvious retinal or arterial  change.  NECK:  Supple without cervical adenopathy or tenderness.  The trachea  was midline, no thyromegaly.  Carotid upstrokes are brisk without any  bruits.  CHEST:  Completely clear bilaterally to auscultation and percussion.  There is a regular rate and rhythm without murmur, gallop or rub  present.  ABDOMEN:  Obese but otherwise benign.  Femoral pulses were present  bilaterally.  EXTREMITIES:  Warm without calf tenderness, cyanosis, clubbing or edema.  NEUROLOGIC:  No focal deficits or pathologic reflexes.  SKIN:  Exam was warm and dry.  Pedal pulses were present bilaterally.  MUSCULOSKELETAL:  Exam revealed minimal crepitance in the shoulders  bilaterally, but full range of motion.   LABORATORY DATA:  EKG revealed minimal left atrial abnormality.  Fasting  blood sugar was 167 with a BNP that was normal, TSH that was normal, LDL  cholesterol of 161, a normal CRP.  Hemoglobin A1c not done, sed rate of  16.   IMPRESSION:  1. Morbid obesity complicated by diabetes.  She has seen nutrition but      has not returned with her food diary as requested.  We need to      arrange this as well as a hemoglobin A1c.  2. Hyperlipidemia.  Target LDL is less than 100.  She is no longer      taking statins because of the concern she may have had an adverse      drug effect.  I might consider rechallenging her at this point with      Crestor, if she will agree.  3. Diabetes mellitus, type 2.  Hemoglobin A1c needs to be repeated on      her next visit, it was not done today.  4. Tendency to depression and fatigue.  Better on Zoloft daily.  5. Hypertension, adequately controlled on her present regimen, which      was not changed, with no evidence of significant secondary organ       damage.  6. Gynecologic health maintenance per Dr. Richarda Overlie.  The      patient refuses flu shot today, was given Pneumovax in 1998 and a      tetanus in 2006.  Followup will be with our nurse practitioner to      generate a new medication calendar for her and consider adjusting      her hyperglycemic regimen as well as re-initiate statins, keeping      in mind that she appears to have recovered now fully from an      idiopathic urticaria, which may have been drug-induced.  I      explained to the patient that it will take time and trial and      error adding back one medicine at a time to sort through what      could have caused this.     [name]. Sherene Sires, MD, [facility]  Electronically Signed    MBW/MedQ  DD: 08/04/2006  DT: 08/06/2006  Job #: [id number]

## 2011-02-03 ENCOUNTER — Telehealth: Payer: Self-pay | Admitting: Internal Medicine

## 2011-02-03 NOTE — Telephone Encounter (Signed)
Dr Sherene Sires, is this okay to send a letter requesting a hand held shower head? Please advise, thanks!

## 2011-02-03 NOTE — Telephone Encounter (Signed)
ok 

## 2011-02-03 NOTE — Telephone Encounter (Signed)
ATC pt, NA and no option to leave a msg, WCB 

## 2011-02-04 ENCOUNTER — Encounter: Payer: Self-pay | Admitting: *Deleted

## 2011-02-04 NOTE — Telephone Encounter (Signed)
Spoke with pt and notified that letter she requested has been faxed to the number that she provided. Pt verbalized understanding.

## 2011-02-04 NOTE — Telephone Encounter (Signed)
PT RETURNED CALL FROM NURSE. [name]

## 2011-02-07 ENCOUNTER — Telehealth: Payer: Self-pay | Admitting: Internal Medicine

## 2011-02-07 NOTE — Telephone Encounter (Signed)
Called, spoke with pt.  She is requesting letter be faxed to [phone].  States they did not receive the fax from last week.  Advised letter was refaxed to this # - - pt aware.

## 2011-03-22 ENCOUNTER — Other Ambulatory Visit: Payer: Self-pay | Admitting: Adult Health

## 2011-03-23 NOTE — Telephone Encounter (Signed)
Received refill request from [facility] [address] for pt's vitamin d 1000iu qd, metformin 500mg  bid, januvia 100mg  qd, diovan-hct 160-12.5mg  bid and amaryl 2mg  qd.  Pt's last appt with MW 07-13-10 and was told to follow up in 3 months.  Pt had acute visit with TP 09-16-10.  No pending appts with MW.  Pt must schedule appt with MW for additional refills.

## 2011-04-18 ENCOUNTER — Telehealth: Payer: Self-pay | Admitting: Internal Medicine

## 2011-04-18 NOTE — Telephone Encounter (Signed)
I do not see any notes that pt was called. She does have an upcoming appt on 04-20-11, call was app reminder call. I ATCx2 and unable to leave message. Carron Curie, CMA

## 2011-04-19 NOTE — Telephone Encounter (Signed)
Spoke with patient-aware that she has appt 04-20-11 at 9am.

## 2011-04-20 ENCOUNTER — Encounter: Payer: Self-pay | Admitting: Internal Medicine

## 2011-04-20 ENCOUNTER — Ambulatory Visit (INDEPENDENT_AMBULATORY_CARE_PROVIDER_SITE_OTHER): Payer: Medicare Other | Admitting: Internal Medicine

## 2011-04-20 ENCOUNTER — Other Ambulatory Visit (INDEPENDENT_AMBULATORY_CARE_PROVIDER_SITE_OTHER): Payer: Medicare Other

## 2011-04-20 ENCOUNTER — Ambulatory Visit (INDEPENDENT_AMBULATORY_CARE_PROVIDER_SITE_OTHER)
Admission: RE | Admit: 2011-04-20 | Discharge: 2011-04-20 | Disposition: A | Discharge status: Home / Self Care | Payer: Medicare Other | Source: Ambulatory Visit | Attending: Internal Medicine | Admitting: Internal Medicine

## 2011-04-20 ENCOUNTER — Other Ambulatory Visit (INDEPENDENT_AMBULATORY_CARE_PROVIDER_SITE_OTHER): Payer: Medicare Other | Admitting: Internal Medicine

## 2011-04-20 VITALS — BP 146/78 | HR 77 | Temp 98.0°F | Ht 61.0 in | Wt 153.8 lb

## 2011-04-20 DIAGNOSIS — F329 Major depressive disorder, single episode, unspecified: Secondary | ICD-10-CM

## 2011-04-20 DIAGNOSIS — E559 Vitamin D deficiency, unspecified: Secondary | ICD-10-CM

## 2011-04-20 DIAGNOSIS — I10 Essential (primary) hypertension: Secondary | ICD-10-CM

## 2011-04-20 DIAGNOSIS — E119 Type 2 diabetes mellitus without complications: Secondary | ICD-10-CM

## 2011-04-20 DIAGNOSIS — R42 Dizziness and giddiness: Secondary | ICD-10-CM

## 2011-04-20 DIAGNOSIS — E785 Hyperlipidemia, unspecified: Secondary | ICD-10-CM

## 2011-04-20 DIAGNOSIS — J310 Chronic rhinitis: Secondary | ICD-10-CM

## 2011-04-20 DIAGNOSIS — R7309 Other abnormal glucose: Secondary | ICD-10-CM

## 2011-04-20 DIAGNOSIS — Z0000 Encounter for general adult medical examination without abnormal findings: Secondary | ICD-10-CM

## 2011-04-20 LAB — HEPATIC FUNCTION PANEL
ALT: 21 U/L (ref 0–35)
AST: 24 U/L (ref 0–37)
Albumin: 4.4 g/dL (ref 3.5–5.2)
Alkaline Phosphatase: 50 U/L (ref 39–117)

## 2011-04-20 LAB — LIPID PANEL
Cholesterol: 166 mg/dL (ref 0–200)
LDL Cholesterol: 93 mg/dL (ref 0–99)

## 2011-04-20 LAB — CBC WITH DIFFERENTIAL/PLATELET
Eosinophils Relative: 3.5 % (ref 0.0–5.0)
HCT: 39 % (ref 36.0–46.0)
Hemoglobin: 12.8 g/dL (ref 12.0–15.0)
Lymphocytes Relative: 31.8 % (ref 12.0–46.0)
Lymphs Abs: 1.8 10*3/uL (ref 0.7–4.0)
Monocytes Relative: 10.4 % (ref 3.0–12.0)
Neutro Abs: 3.1 10*3/uL (ref 1.4–7.7)
RBC: 4.49 Mil/uL (ref 3.87–5.11)
WBC: 5.7 10*3/uL (ref 4.5–10.5)

## 2011-04-20 LAB — BASIC METABOLIC PANEL
GFR: 57.76 mL/min — ABNORMAL LOW (ref >60.00)
Potassium: 4 mEq/L (ref 3.5–5.1)
Sodium: 142 mEq/L (ref 135–145)

## 2011-04-20 LAB — TSH: TSH: 2.37 u[IU]/mL (ref 0.35–5.50)

## 2011-04-20 LAB — HEMOGLOBIN A1C: Hgb A1c MFr Bld: 8.4 % — ABNORMAL HIGH (ref 4.6–6.5)

## 2011-04-20 LAB — URINALYSIS, ROUTINE W REFLEX MICROSCOPIC
Bilirubin Urine: NEGATIVE
Total Protein, Urine: NEGATIVE
Urine Glucose: NEGATIVE

## 2011-04-20 MED ORDER — MECLIZINE HCL 25 MG PO TABS: 25.0000 mg | ORAL_TABLET | ORAL | Status: DC | PRN

## 2011-04-20 NOTE — Assessment & Plan Note (Signed)
No where near target > will verify she's taking meds before considering change rx

## 2011-04-20 NOTE — Assessment & Plan Note (Signed)
Adequate control on present rx, reviewed  

## 2011-04-20 NOTE — Progress Notes (Signed)
Subjective:    Patient ID: [name], female    DOB: [birth year], 76 y.o.   MRN: [id number]  HPI  45  yobf quit smoking 1987 with obesity/ aodm with multiple chronic complaints and documented rhinitis with inconsistent compliance with nasal steroids   May 20, 2010 ov c/o waking up with HA almost every am. She states that her back/ left positional shoulder pain is the same- no better or worse, but is resolved with aleve. She c/o dry cough at night x 1-2 wks. bp too high rec double dose of diovan   July 13, 2010 HA and dizziness. She also c/o ringing in both ears x several days. really bad 11/5 better now and hears ok.. no room spinning but dizzy, has not tried meclizine. no ha, ataxia, nausea.   04/20/2011 f/u ov/Wert cc cpx/ leg cramping once or twice a week x months. No pain walking, no numbness.  Occ dizzy but not orthostatic related and goes away before she has time to try her meclizine prn  Pt denies any significant sore throat, dysphagia,  sneezing,  nasal congestion or excess/ purulent secretions,  fever, chills, sweat unintended wt loss, pleuritic or exertional cp, hempoptysis, orthopnea pnd .  Minimal  leg swelling.    Also denies any obvious fluctuation of symptoms with weather or environmental changes or other aggravating or alleviating factors.   :  Past Medical History:  HYPERTENSION (ICD-401.9)  HYPERLIPIDEMIA (ICD-272.4)  - target --LDL <70 (DM) due to DM  AODM  CHRONIC COUGH  - onset 1990's, almost daly since> resolved May 04, 2009 with ppi two times a day and chlortrimeton  - denied improved on ppi so try off effective August 19, 2009 > worse cough and constipation > restarted  CHRONIC RHINITIS (ICD-472.0)-non adherent with nasal steroids  -Ct sinus nml (08/1999, 01/2005)  Hx of URTICARIA (ICD-708.9)  LUMBAR RADICULOPATHY, RIGHT (ICD-724.4)  DEPRESSION (ICD-311)   MORBID OBESITY (ICD-278.01)  - Target wt = 158 for BMI < 30  VIT D DEFICIENCY dx  08/28/08  - Rx with 50 k biw x 12weeks 09/01/08 > recheck 04/20/2011  Osteopenia  -BMD 04/2000 -nml (-0.4 hip)  -BMD 09/15/08 T spine 3.3, L Fem -1.0, R Fem - 1.0  HEALTH MAINENANCE.......................................................................................Marland KitchenWert  -Td 09/2004 -Pneumovax (1998), 05/02/2008  -Mammogram (9/08)-nml  - Colonsocopy nl 05/2008............................................................................Marland KitchenLina Sar  -GYN........................................................................................................Marland KitchenMarcelle Overlie   -CPX 04/20/2011  -refused flu shot August 18, 2009 and May 20, 2010  COMPLEX MED REGIMEN  -- med calendar completed/adjusted reviewed November 18, 2009 but not following > using it May 20, 2010    Review of Systems  Constitutional: Negative for fever and unexpected weight change.  HENT: Positive for postnasal drip. Negative for ear pain, nosebleeds, congestion, sore throat, rhinorrhea, sneezing, trouble swallowing, dental problem and sinus pressure.   Eyes: Positive for itching. Negative for redness.  Respiratory: Positive for cough. Negative for chest tightness, shortness of breath and wheezing.   Cardiovascular: Positive for leg swelling. Negative for palpitations.  Gastrointestinal: Negative for nausea and vomiting.  Genitourinary: Negative for dysuria.  Musculoskeletal: Negative for joint swelling.  Skin: Negative for rash.  Neurological: Positive for headaches.  Hematological: Does not bruise/bleed easily.  Psychiatric/Behavioral: Positive for dysphoric mood. The patient is nervous/anxious.        Objective:   Physical Exam    wt 152 August 18, 2009> 155 Jan 25, 2010 >  > 132 July 13, 2010 > Wt 153 04/20/2011  amb bf nad  HEENT: edentulous, nl turbinates,  and orophanx. Nl external ear canals without cough reflex  NECK : without JVD/Nodes/TM/ nl carotid upstrokes bilaterally  LUNGS: no acc muscle  use, clear to A and P bilaterally without cough on insp or exp maneuvers  CV: RRR no s3 or murmur or increase in P2, no edema .  Pulses sym bilaterally in feet ABD: soft and nontender with nl excursion in the supine position. No bruits or organomegaly, bowel sounds nl  MS: warm without deformities, calf tenderness, cyanosis or clubbing Neuro alert, no motor, cerebellar deficits, nl gait.  Skin: no lesions    cxr 04/20/2011  No acute disease. Stable compared to prior exam  Assessment & Plan:

## 2011-04-20 NOTE — Patient Instructions (Addendum)
We will call you with the results of your labs  Try quinine in the form of tonic water at bedtime as needed for cramps  Please schedule a follow up visit in 3 months but call sooner if needed   See calendar for specific medication instructions and bring it back for each and every office visit for every healthcare provider you see.  Without it,  you may not receive the best quality medical care that we feel you deserve.  You will note that the calendar groups together  your maintenance  medications that are timed at particular times of the day.  Think of this as your checklist for what your doctor has instructed you to do until your next evaluation to see what benefit  there is  to staying on a consistent group of medications intended to keep you well.  The other group at the bottom is entirely up to you to use as you see fit  for specific symptoms that may arise between visits that require you to treat them on an as needed basis.  Think of this as your action plan or "what if" list.   Separating the top medications from the bottom group is fundamental to providing you adequate care going forward.

## 2011-04-20 NOTE — Assessment & Plan Note (Signed)
Adequate control on present rx, reviewed     Each maintenance medication was reviewed in detail including most importantly the difference between maintenance and as needed and under what circumstances the prns are to be used. This was done in the context of a medication calendar review which provided the patient with a user-friendly unambiguous mechanism for medication administration and reconciliation and provides an action plan for all active problems. It is critical that this be shown to every doctor  for modification during the office visit if necessary so the patient can use it as a working document.     

## 2011-04-20 NOTE — Assessment & Plan Note (Signed)
Now at target for BMI < 30 but since has aodm ideal is bmi < 26 or wt <143

## 2011-04-20 NOTE — Assessment & Plan Note (Signed)
Level pending, rx reviewed

## 2011-04-23 LAB — VITAMIN D 1,25 DIHYDROXY: Vitamin D 1, 25 (OH)2 Total: 39 pg/mL (ref 18–72)

## 2011-04-25 NOTE — Progress Notes (Signed)
Quick Note:  Spoke with pt and notified of results per Dr. Wert. Pt verbalized understanding and denied any questions.  ______ 

## 2011-05-25 ENCOUNTER — Other Ambulatory Visit: Payer: Self-pay | Admitting: Adult Health

## 2011-05-27 ENCOUNTER — Other Ambulatory Visit: Payer: Self-pay | Admitting: Adult Health

## 2011-06-06 LAB — GLUCOSE, CAPILLARY
Glucose-Capillary: 134 — ABNORMAL HIGH
Glucose-Capillary: 168 — ABNORMAL HIGH

## 2011-06-09 ENCOUNTER — Telehealth: Payer: Self-pay | Admitting: Internal Medicine

## 2011-06-09 NOTE — Telephone Encounter (Signed)
ATC x 1. Unable to leave a msg. WCB later.

## 2011-06-10 NOTE — Telephone Encounter (Signed)
ATC--NA and unable to leave VM. WCB 

## 2011-06-10 NOTE — Telephone Encounter (Signed)
Patient returning call.  Please return call to [phone]

## 2011-06-10 NOTE — Telephone Encounter (Signed)
ATC NA UNABLE TO LEAVE VM WCB

## 2011-06-13 NOTE — Telephone Encounter (Signed)
ATC NA WCB 

## 2011-06-13 NOTE — Telephone Encounter (Signed)
Called [facility] spoke with [facility] about [facility]ts rx---they stated that the pt did get the generic for the diovan/hct.  lmom for pt to call me back to find out what she needed.

## 2011-06-14 MED ORDER — DIOVAN HCT 160-12.5 MG PO TABS: 1.0000 | ORAL_TABLET | Freq: Two times a day (BID) | ORAL | Status: DC

## 2011-06-14 NOTE — Telephone Encounter (Signed)
Called and spoke with pt and she stated that she does not want generic of her diovan.  She stated that she takes too many pills to be mixing a generic medicine in with all of them.  rx has been sent to the pharmacy per pts request for name brand only of diovan

## 2011-06-20 ENCOUNTER — Other Ambulatory Visit: Payer: Self-pay | Admitting: Internal Medicine

## 2011-07-20 ENCOUNTER — Other Ambulatory Visit (INDEPENDENT_AMBULATORY_CARE_PROVIDER_SITE_OTHER): Payer: Medicare Other

## 2011-07-20 ENCOUNTER — Ambulatory Visit (INDEPENDENT_AMBULATORY_CARE_PROVIDER_SITE_OTHER)
Admission: RE | Admit: 2011-07-20 | Discharge: 2011-07-20 | Disposition: A | Discharge status: Home / Self Care | Payer: Medicare Other | Source: Ambulatory Visit | Attending: Internal Medicine | Admitting: Internal Medicine

## 2011-07-20 ENCOUNTER — Encounter: Payer: Self-pay | Admitting: Internal Medicine

## 2011-07-20 ENCOUNTER — Ambulatory Visit (INDEPENDENT_AMBULATORY_CARE_PROVIDER_SITE_OTHER): Payer: Medicare Other | Admitting: Internal Medicine

## 2011-07-20 DIAGNOSIS — M2550 Pain in unspecified joint: Secondary | ICD-10-CM

## 2011-07-20 DIAGNOSIS — E785 Hyperlipidemia, unspecified: Secondary | ICD-10-CM

## 2011-07-20 DIAGNOSIS — F329 Major depressive disorder, single episode, unspecified: Secondary | ICD-10-CM

## 2011-07-20 DIAGNOSIS — I10 Essential (primary) hypertension: Secondary | ICD-10-CM

## 2011-07-20 DIAGNOSIS — E119 Type 2 diabetes mellitus without complications: Secondary | ICD-10-CM

## 2011-07-20 DIAGNOSIS — R079 Chest pain, unspecified: Secondary | ICD-10-CM | POA: Insufficient documentation

## 2011-07-20 LAB — LIPID PANEL
Cholesterol: 153 mg/dL (ref 0–200)
LDL Cholesterol: 76 mg/dL (ref 0–99)
Triglycerides: 128 mg/dL (ref 0.0–149.0)

## 2011-07-20 LAB — CBC WITH DIFFERENTIAL/PLATELET
Basophils Relative: 0.7 % (ref 0.0–3.0)
Eosinophils Absolute: 0.3 10*3/uL (ref 0.0–0.7)
Lymphocytes Relative: 33.3 % (ref 12.0–46.0)
MCHC: 32.9 g/dL (ref 30.0–36.0)
Neutrophils Relative %: 50.1 % (ref 43.0–77.0)
RBC: 4.48 Mil/uL (ref 3.87–5.11)
WBC: 6.8 10*3/uL (ref 4.5–10.5)

## 2011-07-20 LAB — HEPATIC FUNCTION PANEL
ALT: 18 U/L (ref 0–35)
AST: 24 U/L (ref 0–37)
Albumin: 4.2 g/dL (ref 3.5–5.2)
Alkaline Phosphatase: 43 U/L (ref 39–117)
Total Protein: 7.4 g/dL (ref 6.0–8.3)

## 2011-07-20 LAB — TSH: TSH: 1.65 u[IU]/mL (ref 0.35–5.50)

## 2011-07-20 MED ORDER — TRAZODONE HCL 50 MG PO TABS: ORAL_TABLET | ORAL | Status: DC

## 2011-07-20 NOTE — Assessment & Plan Note (Signed)
Assoc with insomnia, so change zoloft to 100 mg qam and add trazadone 50 mg @ hs

## 2011-07-20 NOTE — Assessment & Plan Note (Signed)
Adequate control on present rx, reviewed  

## 2011-07-20 NOTE — Assessment & Plan Note (Addendum)
No evidence of RA or inflammatory arthritis but should at least try to take aleve in max allowed dose.

## 2011-07-20 NOTE — Progress Notes (Signed)
Subjective:    Patient ID: [name], female    DOB: 24-Jun-1929, 77 y.o.   MRN: [id number]  HPI  61  yobf quit smoking 1987 with obesity/ aodm with multiple chronic complaints and documented rhinitis with inconsistent compliance with nasal steroids   May 20, 2010 ov c/o waking up with HA almost every am. She states that her back/ left positional shoulder pain is the same- no better or worse, but is resolved with aleve. She c/o dry cough at night x 1-2 wks. bp too high rec double dose of diovan   July 13, 2010 HA and dizziness. She also c/o ringing in both ears x several days. really bad 11/5 better now and hears ok.. no room spinning but dizzy, has not tried meclizine. no ha, ataxia, nausea.   04/20/2011 f/u ov/Donnielle Addison cc cpx/ leg cramping once or twice a week x months. No pain walking, no numbness.  Occ dizzy but not orthostatic related and goes away before she has time to try her meclizine prn rec Try quinine in the form of tonic water at bedtime as needed for cramps See calendar for specific medication instructions   07/20/2011 f/u ov/Jace Fermin cc hurting all over every night when lie down with stiff ness in am indolent onset, progressively worse not much better with aleve but only takes one a day.  No cough or sob. No overt symptoms of hyper or hypoglycemia.  Recurrent L ant cp x years, worse in sitting position, assoc with constipation.    Pt denies any significant sore throat, dysphagia,  sneezing,  nasal congestion or excess/ purulent secretions,  fever, chills, sweat unintended wt loss,  exertional cp, hempoptysis, orthopnea pnd .  Minimal  leg swelling.    Also denies any obvious fluctuation of symptoms with weather or environmental changes or other aggravating or alleviating factors.      Past Medical History:  HYPERTENSION (ICD-401.9)  HYPERLIPIDEMIA (ICD-272.4)  - target --LDL <70 (DM) due to DM  AODM  CHRONIC COUGH  - onset 1990's, almost daly since> resolved  May 04, 2009 with ppi two times a day and chlortrimeton  - denied improved on ppi so try off effective August 19, 2009 > worse cough and constipation > restarted  CHRONIC RHINITIS (ICD-472.0)-non adherent with nasal steroids  -Ct sinus nml (08/1999, 01/2005)  Hx of URTICARIA (ICD-708.9)  LUMBAR RADICULOPATHY, RIGHT (ICD-724.4)  DEPRESSION (ICD-311)   MORBID OBESITY (ICD-278.01)  - Target wt = 158 for BMI < 30  VIT D DEFICIENCY dx 08/28/08  - Rx with 50 k biw x 12weeks 09/01/08 > recheck 04/20/2011  Osteopenia  -BMD 04/2000 -nml (-0.4 hip)  -BMD 09/15/08 T spine 3.3, L Fem -1.0, R Fem - 1.0  HEALTH MAINENANCE.......................................................................................Marland KitchenWert  -Td 09/2004 -Pneumovax (1998), 05/02/2008  -Mammogram (9/08)-nml  - Colonsocopy nl 05/2008............................................................................Marland KitchenLina Sar  -GYN........................................................................................................Marland KitchenMarcelle Overlie   -CPX 04/20/2011  -refused flu shot August 18, 2009 and May 20, 2010  COMPLEX MED REGIMEN  -- med calendar completed/adjusted reviewed November 18, 2009 but not following > using it May 20, 2010           Objective:   Physical Exam  wt 152 August 18, 2009> 155 Jan 25, 2010 > Wt 153 04/20/2011 > 07/20/2011  154  amb bf nad  HEENT: edentulous, nl turbinates, and orophanx. Nl external ear canals without cough reflex  NECK : without JVD/Nodes/TM/ nl carotid upstrokes bilaterally  LUNGS: no acc muscle use, clear to A and P bilaterally without cough on insp  or exp maneuvers  CV: RRR no s3 or murmur or increase in P2, no edema .  Pulses sym bilaterally in feet ABD: soft and nontender with nl excursion in the supine position. No bruits or organomegaly, bowel sounds nl  MS: warm without deformities, calf tenderness, cyanosis or clubbing Neuro alert, no motor, cerebellar deficits, nl gait.    Skin: no lesions   CXR  07/20/2011 :  Extensive degenerative changes in the thoracic spine. Chronic bronchitic changes.     Assessment & Plan:

## 2011-07-20 NOTE — Patient Instructions (Signed)
Take zololft 100mg  one each am only Add trazadone 50 mg one half at bedtime Take citrucel one heaping tsp with breakfast and big glass of water Take max aleve as per bottle as needed for aches  Please remember to go to the lab and x-ray department downstairs for your tests - we will call you with the results when they are available.     See Tammy NP w/in 1 weeks with all your medications, even over the counter meds, separated in two separate bags, the ones you take no matter what vs the ones you stop once you feel better and take only as needed when you feel you need them. Please bring your pill box too.   Tammy  will generate for you a new user friendly medication calendar that will put [place] all on the same page re: your medication use.     Without this process, it simply isn't possible to assure that we are providing  your outpatient care  with  the attention to detail we feel you deserve.   If we cannot assure that you're getting that kind of care,  then we cannot manage your problem effectively from this clinic.  Once you have seen Tammy and we are sure that we're all on the same page with your medication use she will arrange follow up with me.

## 2011-07-20 NOTE — Assessment & Plan Note (Signed)

## 2011-07-20 NOTE — Assessment & Plan Note (Signed)
Lab Results  Component Value Date   HGBA1C 8.4* 07/20/2011   HGBA1C 8.4* 04/20/2011   HGBA1C 8.2* 07/13/2010    Not Adequate control on present rx, reviewed options, for now no change in rx

## 2011-07-27 ENCOUNTER — Ambulatory Visit (INDEPENDENT_AMBULATORY_CARE_PROVIDER_SITE_OTHER): Payer: Medicare Other | Admitting: Adult Health

## 2011-07-27 ENCOUNTER — Other Ambulatory Visit: Payer: Self-pay | Admitting: *Deleted

## 2011-07-27 ENCOUNTER — Encounter: Payer: Self-pay | Admitting: Adult Health

## 2011-07-27 DIAGNOSIS — E119 Type 2 diabetes mellitus without complications: Secondary | ICD-10-CM

## 2011-07-27 DIAGNOSIS — I10 Essential (primary) hypertension: Secondary | ICD-10-CM

## 2011-07-27 DIAGNOSIS — J310 Chronic rhinitis: Secondary | ICD-10-CM

## 2011-07-27 MED ORDER — SIMVASTATIN 40 MG PO TABS: 40.0000 mg | ORAL_TABLET | Freq: Every day | ORAL | Status: DC

## 2011-07-27 MED ORDER — AZITHROMYCIN 250 MG PO TABS: ORAL_TABLET | ORAL | Status: AC

## 2011-07-27 MED ORDER — HYDROCODONE-ACETAMINOPHEN 5-500 MG PO TABS: 1.0000 | ORAL_TABLET | Freq: Four times a day (QID) | ORAL | Status: DC | PRN

## 2011-07-27 NOTE — Assessment & Plan Note (Signed)
No significant change in DM control. Will hold off on med changes for now, recheck in 3 months  If not better or tr up will need to increase amaryl.  Advised on dm diet.

## 2011-07-27 NOTE — Assessment & Plan Note (Signed)
Mild flare with URI  zpack as dircted Patient's medications were reviewed today and patient education was given. Computerized medication calendar was adjusted/completed

## 2011-07-27 NOTE — Telephone Encounter (Signed)
Received faxed refill request for Simvastatin 40mg  from [facility] [address].

## 2011-07-27 NOTE — Progress Notes (Signed)
Subjective:    Patient ID: [name], female    DOB: 1929-06-16, 77 y.o.   MRN: [id number]  HPI  65  yobf quit smoking 1987 with obesity/ aodm with multiple chronic complaints and documented rhinitis with inconsistent compliance with nasal steroids   May 20, 2010 ov c/o waking up with HA almost every am. She states that her back/ left positional shoulder pain is the same- no better or worse, but is resolved with aleve. She c/o dry cough at night x 1-2 wks. bp too high rec double dose of diovan   July 13, 2010 HA and dizziness. She also c/o ringing in both ears x several days. really bad 11/5 better now and hears ok.. no room spinning but dizzy, has not tried meclizine. no ha, ataxia, nausea.   04/20/2011 f/u ov/Wert cc cpx/ leg cramping once or twice a week x months. No pain walking, no numbness.  Occ dizzy but not orthostatic related and goes away before she has time to try her meclizine prn rec Try quinine in the form of tonic water at bedtime as needed for cramps See calendar for specific medication instructions   07/20/2011 f/u ov/Wert cc hurting all over every night when lie down with stiff ness in am indolent onset, progressively worse not much better with aleve but only takes one a day.  No cough or sob. No overt symptoms of hyper or hypoglycemia.  Recurrent L ant cp x years, worse in sitting position, assoc with constipation.  >>rx trazadone.   07/27/2011 Follow up and Med review Pt returns for follow up and med review. Last visit , trouble with sleeping, Trazadone was added. Pt says she is sleeping much better. Does complain over last 5 days , cough and congestion have started. Complains of head congestion, prod cough with small amounts of yellow mucus x1week .  No hemoptysis . Does have some throat drainage and dry cough at night. We discussed her chlor tabs for this-she has not started this yet. Using mucinex without much help.  We reviewed all her meds and organized  them into her med calendar with update, and pt education  Labs done last ov were essentially unremarkable except for A1C was unchanged at 8.4 - we discussed diet.      Past Medical History:  HYPERTENSION (ICD-401.9)  HYPERLIPIDEMIA (ICD-272.4)  - target --LDL <70 (DM) due to DM  AODM  CHRONIC COUGH  - onset 1990's, almost daly since> resolved May 04, 2009 with ppi two times a day and chlortrimeton  - denied improved on ppi so try off effective August 19, 2009 > worse cough and constipation > restarted  CHRONIC RHINITIS (ICD-472.0)-non adherent with nasal steroids  -Ct sinus nml (08/1999, 01/2005)  Hx of URTICARIA (ICD-708.9)  LUMBAR RADICULOPATHY, RIGHT (ICD-724.4)  DEPRESSION (ICD-311)   MORBID OBESITY (ICD-278.01)  - Target wt = 158 for BMI < 30  VIT D DEFICIENCY dx 08/28/08  - Rx with 50 k biw x 12weeks 09/01/08 > recheck 04/20/2011  Osteopenia  -BMD 04/2000 -nml (-0.4 hip)  -BMD 09/15/08 T spine 3.3, L Fem -1.0, R Fem - 1.0  HEALTH MAINENANCE.......................................................................................Marland KitchenWert  -Td 09/2004 -Pneumovax (1998), 05/02/2008  -Mammogram (9/08)-nml  - Colonsocopy nl 05/2008............................................................................Marland KitchenLina Sar  -GYN........................................................................................................Marland KitchenMarcelle Overlie   -CPX 04/20/2011  -refused flu shot August 18, 2009 and May 20, 2010  COMPLEX MED REGIMEN  -- med calendar completed/adjusted reviewed November 18, 2009 but not following > using it May 20, 2010 , 07/27/2011  Constitutional:   No  weight loss, night sweats,  Fevers, chills,  +fatigue, or  lassitude.  HEENT:   No headaches,  Difficulty swallowing,  Tooth/dental problems, or  Sore throat,                No sneezing, itching, ear ache, nasal congestion,  +=post nasal drip,   CV:  No chest pain,  Orthopnea, PND, swelling in lower  extremities, anasarca, dizziness, palpitations, syncope.   GI  No heartburn, indigestion, abdominal pain, nausea, vomiting, diarrhea, change in bowel habits, loss of appetite, bloody stools.   Resp: ,  No coughing up of blood.   No chest wall deformity  Skin: no rash or lesions.  GU: no dysuria, change in color of urine, no urgency or frequency.  No flank pain, no hematuria   MS:  No joint  swelling.  No decreased range of motion.    Psych:  No change in mood or affect. No depression or anxiety.  No memory loss.          Objective:   Physical Exam  wt 152 August 18, 2009> 155 Jan 25, 2010 > Wt 153 04/20/2011 > 07/20/2011  154 >>154 07/27/2011  amb bf nad  HEENT: edentulous, nl turbinates, and orophanx. Nl external ear canals without cough reflex  NECK : without JVD/Nodes/TM/ nl carotid upstrokes bilaterally  LUNGS: no acc muscle use, clear to A and P bilaterally without cough on insp or exp maneuvers  CV: RRR no s3 or murmur or increase in P2, no edema .  Pulses sym bilaterally in feet ABD: soft and nontender with nl excursion in the supine position. No bruits or organomegaly, bowel sounds nl  MS: warm without deformities, calf tenderness, cyanosis or clubbing Neuro alert, no motor, cerebellar deficits, nl gait.  Skin: no lesions     CXR  07/20/2011 :  Extensive degenerative changes in the thoracic spine. Chronic bronchitic changes.     Assessment & Plan:

## 2011-07-27 NOTE — Assessment & Plan Note (Signed)
Controlled on rx   

## 2011-07-27 NOTE — Patient Instructions (Signed)
Zpack Take as directed.  Follow med calendar closely and bring to each visit.  follow up Dr. Sherene Sires  In 3 months and As needed

## 2011-08-02 MED ORDER — SIMETHICONE 80 MG PO CHEW: CHEWABLE_TABLET | ORAL | Status: DC

## 2011-08-02 MED ORDER — MAGNESIUM HYDROXIDE 400 MG/5ML PO SUSP: ORAL | Status: AC

## 2011-08-02 MED ORDER — ACETAMINOPHEN 500 MG PO TABS: ORAL_TABLET | ORAL | Status: DC

## 2011-08-02 MED ORDER — CALCIUM-VITAMIN D 500-400 MG-UNIT PO TABS: 1.0000 | ORAL_TABLET | Freq: Every day | ORAL | Status: DC

## 2011-08-02 MED ORDER — NAPROXEN SODIUM 220 MG PO TABS: ORAL_TABLET | ORAL | Status: DC

## 2011-08-02 MED ORDER — OMEPRAZOLE 20 MG PO CPDR: DELAYED_RELEASE_CAPSULE | ORAL | Status: DC

## 2011-08-02 MED ORDER — SERTRALINE HCL 100 MG PO TABS: 100.0000 mg | ORAL_TABLET | Freq: Every day | ORAL | Status: DC

## 2011-08-02 MED ORDER — MECLIZINE HCL 25 MG PO TABS: ORAL_TABLET | ORAL | Status: DC

## 2011-08-02 MED ORDER — DM-GUAIFENESIN ER 30-600 MG PO TB12: 1.0000 | ORAL_TABLET | Freq: Two times a day (BID) | ORAL | Status: DC | PRN

## 2011-08-02 NOTE — Progress Notes (Signed)
11.21.12 med calendar update.

## 2011-08-02 NOTE — Progress Notes (Signed)
Addended by: Boone Master E on: 08/02/2011 02:28 PM   Modules accepted: Orders

## 2011-08-31 ENCOUNTER — Other Ambulatory Visit: Payer: Self-pay | Admitting: Allergy

## 2011-08-31 MED ORDER — METFORMIN HCL 500 MG PO TABS: 500.0000 mg | ORAL_TABLET | Freq: Two times a day (BID) | ORAL | Status: DC

## 2011-09-30 ENCOUNTER — Other Ambulatory Visit: Payer: Self-pay | Admitting: Adult Health

## 2011-10-24 ENCOUNTER — Ambulatory Visit (INDEPENDENT_AMBULATORY_CARE_PROVIDER_SITE_OTHER): Payer: Medicare Other | Admitting: Internal Medicine

## 2011-10-24 ENCOUNTER — Encounter: Payer: Self-pay | Admitting: Internal Medicine

## 2011-10-24 ENCOUNTER — Other Ambulatory Visit (INDEPENDENT_AMBULATORY_CARE_PROVIDER_SITE_OTHER): Payer: Medicare Other

## 2011-10-24 DIAGNOSIS — E119 Type 2 diabetes mellitus without complications: Secondary | ICD-10-CM

## 2011-10-24 DIAGNOSIS — E785 Hyperlipidemia, unspecified: Secondary | ICD-10-CM | POA: Diagnosis not present

## 2011-10-24 DIAGNOSIS — E559 Vitamin D deficiency, unspecified: Secondary | ICD-10-CM

## 2011-10-24 DIAGNOSIS — I10 Essential (primary) hypertension: Secondary | ICD-10-CM | POA: Diagnosis not present

## 2011-10-24 LAB — LIPID PANEL
HDL: 48.9 mg/dL (ref >39.00)
LDL Cholesterol: 69 mg/dL (ref 0–99)
Total CHOL/HDL Ratio: 3
VLDL: 28 mg/dL (ref 0.0–40.0)

## 2011-10-24 LAB — HEPATIC FUNCTION PANEL
AST: 19 U/L (ref 0–37)
Alkaline Phosphatase: 43 U/L (ref 39–117)
Bilirubin, Direct: 0.1 mg/dL (ref 0.0–0.3)
Total Bilirubin: 0.5 mg/dL (ref 0.3–1.2)

## 2011-10-24 LAB — BASIC METABOLIC PANEL
Calcium: 10.2 mg/dL (ref 8.4–10.5)
GFR: 54.42 mL/min — ABNORMAL LOW (ref >60.00)
Potassium: 4 mEq/L (ref 3.5–5.1)
Sodium: 139 mEq/L (ref 135–145)

## 2011-10-24 LAB — HEMOGLOBIN A1C: Hgb A1c MFr Bld: 7.6 % — ABNORMAL HIGH (ref 4.6–6.5)

## 2011-10-24 MED ORDER — TRAZODONE HCL 50 MG PO TABS: ORAL_TABLET | ORAL | Status: DC

## 2011-10-24 MED ORDER — SERTRALINE HCL 100 MG PO TABS: ORAL_TABLET | ORAL | Status: DC

## 2011-10-24 MED ORDER — SIMVASTATIN 40 MG PO TABS: ORAL_TABLET | ORAL | Status: DC

## 2011-10-24 NOTE — Progress Notes (Signed)
Subjective:    Patient ID: [name], female    DOB: [birth year]  MRN: [id number]   Brief patient profile:  82  yobf quit smoking 1987 with obesity/ aodm with multiple chronic complaints and documented rhinitis with inconsistent compliance with nasal steroids    HPI May 20, 2010 ov c/o waking up with HA almost every am. She states that her back/ left positional shoulder pain is the same- no better or worse, but is resolved with aleve. She c/o dry cough at night x 1-2 wks. bp too high rec double dose of diovan   July 13, 2010 HA and dizziness. She also c/o ringing in both ears x several days. really bad 11/5 better now and hears ok.. no room spinning but dizzy, has not tried meclizine. no ha, ataxia, nausea.   04/20/2011 f/u ov/Amrutha Avera cc cpx/ leg cramping once or twice a week x months. No pain walking, no numbness.  Occ dizzy but not orthostatic related and goes away before she has time to try her meclizine prn rec Try quinine in the form of tonic water at bedtime as needed for cramps See calendar for specific medication instructions   07/20/2011 f/u ov/Ciela Mahajan cc hurting all over every night when lie down with stiffness in am indolent onset, progressively worse not much better with aleve but only takes one a day.  No cough or sob. No overt symptoms of hyper or hypoglycemia.  Recurrent L ant cp x years, worse in sitting position, assoc with constipation.  >>rx trazadone.   07/27/2011 Follow up and Med review Pt returns for follow up and med review. Last visit , trouble with sleeping, Trazadone was added. Pt says she is sleeping much better. Does complain over last 5 days , cough and congestion have started. Complains of head congestion, prod cough with small amounts of yellow mucus x1week .  No hemoptysis . Does have some throat drainage and dry cough at night. We discussed her chlor tabs for this-she has not started this yet. Using mucinex without much help.  We reviewed all her  meds and organized them into her med calendar with update, and pt education  Labs done last ov were essentially unremarkable except for A1C was unchanged at 8.4 - we discussed diet.  rec Zpack Take as directed.  Follow med calendar closely and bring to each visit.    10/24/2011 ov Jeronica Stlouis/ f/u multiple chronic problems fasting for f/u lost the new med calendar provided but c/o chest pains went away " awhile ago" not reproducible with exertions.  HA no better, worse with anxiety not in am's and no assoc neuro cos, napping in afternoons sleeping poorly on trazadone 25.  Denies any obvious fluctuation of symptoms with weather or environmental changes or other aggravating or alleviating factors except as outlined above   ROS:  At present neg for  any significant sore throat, dysphagia, itching, sneezing,  nasal congestion or excess/ purulent secretions,  fever, chills, sweats, unintended wt loss, pleuritic or exertional cp, hempoptysis, orthopnea pnd or leg swelling.          Past Medical History:  HYPERTENSION (ICD-401.9)  HYPERLIPIDEMIA (ICD-272.4)  - target --LDL <70 (DM) due to DM  AODM  CHRONIC COUGH  - onset 1990's, almost daly since> resolved May 04, 2009 with ppi two times a day and chlortrimeton  - denied improved on ppi so try off effective August 19, 2009 > worse cough and constipation > restarted  CHRONIC RHINITIS (ICD-472.0)-non adherent with nasal  steroids  -Ct sinus nml (08/1999, 01/2005)  Hx of URTICARIA (ICD-708.9)  LUMBAR RADICULOPATHY, RIGHT (ICD-724.4)  DEPRESSION (ICD-311)   MORBID OBESITY (ICD-278.01)  - Target wt = 158 for BMI < 30  VIT D DEFICIENCY dx 08/28/08  - Rx with 50 k biw x 12weeks 09/01/08 > recheck 04/20/2011  Osteopenia  -BMD 04/2000 -nml (-0.4 hip)  -BMD 09/15/08 T spine 3.3, L Fem -1.0, R Fem - 1.0  HEALTH MAINENANCE.......................................................................................Marland KitchenWert  -Td 09/2004 -Pneumovax (1998), 05/02/2008    -Mammogram (9/08)-nml  - Colonsocopy nl 05/2008............................................................................Marland KitchenLina Sar  -GYN........................................................................................................Marland KitchenMarcelle Overlie   -CPX 04/20/2011  -refused flu shot August 18, 2009 and May 20, 2010  COMPLEX MED REGIMEN  -- med calendar completed/adjusted reviewed November 18, 2009 but not following > using it May 20, 2010 , 07/27/2011             Objective:   Physical Exam  wt 152 August 18, 2009> 155 Jan 25, 2010 > Wt 153 04/20/2011 > 07/20/2011  154 >>154 07/27/2011  > 10/24/2011   152 amb bf nad  HEENT: edentulous, nl turbinates, and orophanx. Nl external ear canals without cough reflex  NECK : without JVD/Nodes/TM/ nl carotid upstrokes bilaterally  LUNGS: no acc muscle use, clear to A and P bilaterally without cough on insp or exp maneuvers  CV: RRR no s3 or murmur or increase in P2, no edema .  Pulses sym bilaterally in feet ABD: soft and nontender with nl excursion in the supine position. No bruits or organomegaly, bowel sounds nl  MS: warm without deformities, calf tenderness, cyanosis or clubbing Neuro alert, no motor, cerebellar deficits, nl gait.  Skin: no lesions     CXR  07/20/2011 :  Extensive degenerative changes in the thoracic spine. Chronic bronchitic changes.     Assessment & Plan:

## 2011-10-24 NOTE — Assessment & Plan Note (Signed)
Lab Results  Component Value Date   LDLCALC 69 10/24/2011    Adequate control on present rx, reviewed

## 2011-10-24 NOTE — Patient Instructions (Addendum)
Please remember to go to the lab department downstairs for your tests - we will call you with the results when they are available.  Increase trazadone to 50 mg one at bedtime and no napping during the day until sleeping better at night  See calendar for specific medication instructions and bring it back for each and every office visit for every healthcare provider you see.  Without it,  you may not receive the best quality medical care that we feel you deserve.  You will note that the calendar groups together  your maintenance  medications that are timed at particular times of the day.  Think of this as your checklist for what your doctor has instructed you to do until your next evaluation to see what benefit  there is  to staying on a consistent group of medications intended to keep you well.  The other group at the bottom is entirely up to you to use as you see fit  for specific symptoms that may arise between visits that require you to treat them on an as needed basis.  Think of this as your action plan or "what if" list.   Separating the top medications from the bottom group is fundamental to providing you adequate care going forward.    Please schedule a follow up visit in 3 months but call sooner if needed

## 2011-10-24 NOTE — Progress Notes (Signed)
Quick Note:  Spoke with pt and notified of results per Dr. Wert. Pt verbalized understanding and denied any questions.  ______ 

## 2011-10-24 NOTE — Assessment & Plan Note (Signed)
Adequate control on present rx, reviewed     Each maintenance medication was reviewed in detail including most importantly the difference between maintenance and as needed and under what circumstances the prns are to be used. This was done in the context of a medication calendar review which provided the patient with a user-friendly unambiguous mechanism for medication administration and reconciliation and provides an action plan for all active problems. It is critical that this be shown to every doctor  for modification during the office visit if necessary so the patient can use it as a working document.     

## 2011-10-24 NOTE — Assessment & Plan Note (Signed)
Adequate control on present rx, reviewed   Lab Results  Component Value Date   HGBA1C 7.6* 10/24/2011

## 2011-10-25 NOTE — Progress Notes (Signed)
Quick Note:  Spoke with pt and notified of results per Dr. Wert. Pt verbalized understanding and denied any questions.  ______ 

## 2011-10-31 ENCOUNTER — Telehealth: Payer: Self-pay | Admitting: Internal Medicine

## 2011-10-31 NOTE — Telephone Encounter (Signed)
Spoke with [facility] and a PA is needed if pt is to take Diovan HCT twice daily. Was placed on hold for 10 minutes when transferred to the PA Dept. Will need to call back in the morning to try for PA.

## 2011-11-01 NOTE — Telephone Encounter (Signed)
Called and initiated PA, answered the clinical questions and am awaiting approval/denial- this will take up to 72 hours per [facility] rep. Case ID number is [id number]. Will hold in my basket until approval/denial rec.

## 2011-11-03 NOTE — Telephone Encounter (Signed)
Received letter from [facility] that med was approved. Spoke with pt and notified that this was done. She verbalized understanding. Letter is MW scan folder.

## 2011-11-15 ENCOUNTER — Other Ambulatory Visit: Payer: Self-pay | Admitting: Internal Medicine

## 2011-12-02 ENCOUNTER — Other Ambulatory Visit: Payer: Self-pay | Admitting: Adult Health

## 2011-12-02 ENCOUNTER — Other Ambulatory Visit: Payer: Self-pay | Admitting: Internal Medicine

## 2011-12-12 ENCOUNTER — Telehealth: Payer: Self-pay | Admitting: Internal Medicine

## 2011-12-12 MED ORDER — GLIMEPIRIDE 2 MG PO TABS: 2.0000 mg | ORAL_TABLET | Freq: Every day | ORAL | Status: DC

## 2011-12-12 NOTE — Telephone Encounter (Signed)
PT informed that refill for Glimeperide was sent to pharmacy.

## 2011-12-17 ENCOUNTER — Other Ambulatory Visit: Payer: Self-pay | Admitting: Adult Health

## 2012-01-14 ENCOUNTER — Other Ambulatory Visit: Payer: Self-pay | Admitting: Internal Medicine

## 2012-01-23 ENCOUNTER — Encounter: Payer: Self-pay | Admitting: Internal Medicine

## 2012-01-23 ENCOUNTER — Ambulatory Visit (INDEPENDENT_AMBULATORY_CARE_PROVIDER_SITE_OTHER): Payer: Medicare Other | Admitting: Internal Medicine

## 2012-01-23 VITALS — BP 122/72 | HR 88 | Temp 98.2°F | Ht 62.0 in | Wt 151.2 lb

## 2012-01-23 DIAGNOSIS — E119 Type 2 diabetes mellitus without complications: Secondary | ICD-10-CM

## 2012-01-23 DIAGNOSIS — E785 Hyperlipidemia, unspecified: Secondary | ICD-10-CM | POA: Diagnosis not present

## 2012-01-23 DIAGNOSIS — I10 Essential (primary) hypertension: Secondary | ICD-10-CM

## 2012-01-23 DIAGNOSIS — M2550 Pain in unspecified joint: Secondary | ICD-10-CM

## 2012-01-23 NOTE — Assessment & Plan Note (Signed)
Adequate control on present rx, reviewed rx with prn aleve

## 2012-01-23 NOTE — Assessment & Plan Note (Signed)
Adequate control on present rx, reviewed as per med calendar

## 2012-01-23 NOTE — Assessment & Plan Note (Signed)
hgba1c trending down but not at target > reviewed, recheck in 3 months at cpx    Each maintenance medication was reviewed in detail including most importantly the difference between maintenance and as needed and under what circumstances the prns are to be used. This was done in the context of a medication calendar review which provided the patient with a user-friendly unambiguous mechanism for medication administration and reconciliation and provides an action plan for all active problems. It is critical that this be shown to every doctor  for modification during the office visit if necessary so the patient can use it as a working document.

## 2012-01-23 NOTE — Progress Notes (Signed)
Subjective:    Patient ID: [name], female    DOB: Jan 27, 1929  MRN: [id number]   Brief patient profile:  78  yobf quit smoking 1987 with obesity/ aodm with multiple chronic complaints and documented rhinitis with inconsistent compliance with nasal steroids    HPI May 20, 2010 ov c/o waking up with HA almost every am. She states that her back/ left positional shoulder pain is the same- no better or worse, but is resolved with aleve. She c/o dry cough at night x 1-2 wks. bp too high rec double dose of diovan   July 13, 2010 HA and dizziness. She also c/o ringing in both ears x several days. really bad 11/5 better now and hears ok.. no room spinning but dizzy, has not tried meclizine. no ha, ataxia, nausea.   04/20/2011 f/u ov/[name] cc cpx/ leg cramping once or twice a week x months. No pain walking, no numbness.  Occ dizzy but not orthostatic related and goes away before she has time to try her meclizine prn rec Try quinine in the form of tonic water at bedtime as needed for cramps See calendar for specific medication instructions   07/20/2011 f/u ov/[name] cc hurting all over every night when lie down with stiffness in am indolent onset, progressively worse not much better with aleve but only takes one a day.  No cough or sob. No overt symptoms of hyper or hypoglycemia.  Recurrent L ant cp x years, worse in sitting position, assoc with constipation.  >>rx trazadone.   07/27/2011 Follow up and Med review Pt returns for follow up and med review. Last visit , trouble with sleeping, Trazadone was added. Pt says she is sleeping much better. Does complain over last 5 days , cough and congestion have started. Complains of head congestion, prod cough with small amounts of yellow mucus x1week .  No hemoptysis . Does have some throat drainage and dry cough at night. We discussed her chlor tabs for this-she has not started this yet. Using mucinex without much help.  We reviewed all her  meds and organized them into her med calendar with update, and pt education  Labs done last ov were essentially unremarkable except for A1C was unchanged at 8.4 - we discussed diet.  rec Zpack Take as directed.  Follow med calendar closely and bring to each visit.    10/24/2011 ov [name]/ f/u multiple chronic problems fasting for f/u lost the new med calendar provided but c/o chest pains went away " awhile ago" not reproducible with exertions.  HA no better, worse with anxiety not in am's and no assoc neuro cos, napping in afternoons sleeping poorly on trazadone 25. rec Please remember to go to the lab > all labs improving including hgba1c down from 8.4 to 7.6 Increase trazadone to 50 mg one at bedtime and no napping during the day until sleeping better at night See calendar for specific medication instructions      01/23/2012 f/u ov/[name] f/u hbp/ dm/hyperlipidemia/ insomnia cc sleeping better on melatonin, more active during the day and no high or low sugar symtoms, no tia or claudication or sob though she's relatively sedentary.  Following meds on calendar well, only stopped the trazadone because found it didn't sleep as well as melatonin.  Only using from prn list occ aleve.  Sleeping ok without nocturnal  or early am exacerbation  of respiratory  C/o's . Also denies any obvious fluctuation of symptoms with weather or environmental changes or other  aggravating or alleviating factors except as outlined above   Denies any obvious fluctuation of symptoms with weather or environmental changes or other aggravating or alleviating factors except as outlined above   ROS  At present neg for  any significant sore throat, dysphagia, dental problems, itching, sneezing,  nasal congestion or excess/ purulent secretions, ear ache,   fever, chills, sweats, unintended wt loss, pleuritic or exertional cp, hemoptysis, palpitations, orthopnea pnd or leg swelling.  Also denies presyncope, palpitations, heartburn,  abdominal pain, anorexia, nausea, vomiting, diarrhea  or change in bowel or urinary habits, change in stools or urine, dysuria,hematuria,  rash, arthralgias, visual complaints, headache, numbness weakness or ataxia or problems with walking or coordination. No noted change in mood/affect or memory.                         Past Medical History:  HYPERTENSION (ICD-401.9)  HYPERLIPIDEMIA (ICD-272.4)  - target --LDL <70 (DM) due to DM  AODM  CHRONIC COUGH  - onset 1990's, almost daly since> resolved May 04, 2009 with ppi two times a day and chlortrimeton  - denied improved on ppi so try off effective August 19, 2009 > worse cough and constipation > restarted  CHRONIC RHINITIS (ICD-472.0)-non adherent with nasal steroids  -Ct sinus nml (08/1999, 01/2005)  Hx of URTICARIA (ICD-708.9)  LUMBAR RADICULOPATHY, RIGHT (ICD-724.4)  DEPRESSION (ICD-311)   MORBID OBESITY (ICD-278.01)  - Target wt = 158 for BMI < 30  VIT D DEFICIENCY dx 08/28/08  - Rx with 50 k biw x 12weeks 09/01/08 > recheck 04/20/2011  Osteopenia  -BMD 04/2000 -nml (-0.4 hip)  -BMD 09/15/08 T spine 3.3, L Fem -1.0, R Fem - 1.0  HEALTH MAINENANCE.......................................................................................Marland KitchenWert  -Td 09/2004 -Pneumovax (1998), 05/02/2008  -Mammogram (9/08)-nml  - Colonsocopy nl 05/2008............................................................................Marland KitchenLina Sar  -GYN........................................................................................................Marland KitchenMarcelle Overlie   -CPX 04/20/2011  -refused flu shot August 18, 2009 and May 20, 2010  COMPLEX MED REGIMEN  -- med calendar completed/adjusted reviewed November 18, 2009 but not following > using it May 20, 2010 , 07/27/2011             Objective:   Physical Exam  wt   155 Jan 25, 2010 > Wt 153 04/20/2011 >  10/24/2011 152 > 01/23/2012 151 amb bf nad/ brighter affect HEENT: edentulous with  dentures in place, nl turbinates, and orophanx. Nl external ear canals without cough reflex  NECK : without JVD/Nodes/TM/ nl carotid upstrokes bilaterally  LUNGS: no acc muscle use, clear to A and P bilaterally without cough on insp or exp maneuvers  CV: RRR no s3 or murmur or increase in P2, no edema .  Pulses sym bilaterally in feet ABD: soft and nontender with nl excursion in the supine position. No bruits or organomegaly, bowel sounds nl  MS: warm without deformities, calf tenderness, cyanosis or clubbing Neuro alert, no motor, cerebellar deficits, nl gait.  Skin: no lesions     CXR  07/20/2011 :  Extensive degenerative changes in the thoracic spine. Chronic bronchitic changes.     Assessment & Plan:

## 2012-01-23 NOTE — Patient Instructions (Signed)
See calendar for specific medication instructions and bring it back for each and every office visit for every healthcare provider you see.  Without it,  you may not receive the best quality medical care that we feel you deserve.  You will note that the calendar groups together  your maintenance  medications that are timed at particular times of the day.  Think of this as your checklist for what your doctor has instructed you to do until your next evaluation to see what benefit  there is  to staying on a consistent group of medications intended to keep you well.  The other group at the bottom is entirely up to you to use as you see fit  for specific symptoms that may arise between visits that require you to treat them on an as needed basis.  Think of this as your action plan or "what if" list.   Separating the top medications from the bottom group is fundamental to providing you adequate care going forward.   Please schedule a follow up visit in 3 months for CPX (nothing to eat that morning except take your blood pressure pill with water )

## 2012-01-23 NOTE — Assessment & Plan Note (Signed)
Lab Results  Component Value Date   LDLCALC 69 10/24/2011    Adequate control on present rx, reviewed

## 2012-01-31 ENCOUNTER — Telehealth: Payer: Self-pay | Admitting: Internal Medicine

## 2012-01-31 MED ORDER — DOXYCYCLINE HYCLATE 100 MG PO TABS: 100.0000 mg | ORAL_TABLET | Freq: Two times a day (BID) | ORAL | Status: AC

## 2012-01-31 NOTE — Telephone Encounter (Signed)
First make sure she' following the med calendar which has prns for cough on it  Call in doxy 100 bid  10 days then ov if not 100% better

## 2012-01-31 NOTE — Telephone Encounter (Signed)
ATCx2, NA, no voicemail. WCB. Doxy rx has been sent to pharmacy.  Carron Curie, CMA

## 2012-01-31 NOTE — Telephone Encounter (Signed)
Spoke with pt. She is c/o slight increase in SOB, proc cough with dark yellow sputum, low grade temp and chest soreness- relates to the cough. Onset of symptoms 3 days ago. Appt offered but was declined due to lack of transportation. Please advise recs thanks! Allergies  Allergen Reactions  . Albuterol

## 2012-02-01 NOTE — Telephone Encounter (Signed)
Called # listed in chart adn was advised I had the wrong # and I checked the # dialed and it was correct. I called daughter cell # and [id number] x1

## 2012-02-02 NOTE — Telephone Encounter (Signed)
Called, spoke with pt. States she is following her med calander for the prn's for cough but not helping.  Advised doxy rx has been sent in.  She should take this bid x 10 days and if cough is not 100%, she will need OV.  She is aware doxy rx sent to [facility] on [address].  She verbalized understanding of these recs and voiced no further questions/concerns at this time.

## 2012-02-11 ENCOUNTER — Other Ambulatory Visit: Payer: Self-pay | Admitting: Internal Medicine

## 2012-04-23 ENCOUNTER — Encounter: Payer: Self-pay | Admitting: Internal Medicine

## 2012-04-23 ENCOUNTER — Ambulatory Visit (INDEPENDENT_AMBULATORY_CARE_PROVIDER_SITE_OTHER): Payer: Medicare Other | Admitting: Internal Medicine

## 2012-04-23 ENCOUNTER — Ambulatory Visit (INDEPENDENT_AMBULATORY_CARE_PROVIDER_SITE_OTHER)
Admission: RE | Admit: 2012-04-23 | Discharge: 2012-04-23 | Disposition: A | Discharge status: Home / Self Care | Payer: Medicare Other | Source: Ambulatory Visit | Attending: Internal Medicine | Admitting: Internal Medicine

## 2012-04-23 ENCOUNTER — Other Ambulatory Visit (INDEPENDENT_AMBULATORY_CARE_PROVIDER_SITE_OTHER): Payer: Medicare Other

## 2012-04-23 ENCOUNTER — Telehealth: Payer: Self-pay | Admitting: Internal Medicine

## 2012-04-23 VITALS — BP 148/72 | HR 85 | Temp 97.4°F | Ht 62.0 in | Wt 149.8 lb

## 2012-04-23 DIAGNOSIS — E119 Type 2 diabetes mellitus without complications: Secondary | ICD-10-CM | POA: Diagnosis not present

## 2012-04-23 DIAGNOSIS — E785 Hyperlipidemia, unspecified: Secondary | ICD-10-CM | POA: Diagnosis not present

## 2012-04-23 DIAGNOSIS — IMO0002 Reserved for concepts with insufficient information to code with codable children: Secondary | ICD-10-CM

## 2012-04-23 DIAGNOSIS — I10 Essential (primary) hypertension: Secondary | ICD-10-CM | POA: Diagnosis not present

## 2012-04-23 DIAGNOSIS — Z0000 Encounter for general adult medical examination without abnormal findings: Secondary | ICD-10-CM | POA: Diagnosis not present

## 2012-04-23 DIAGNOSIS — E559 Vitamin D deficiency, unspecified: Secondary | ICD-10-CM | POA: Diagnosis not present

## 2012-04-23 LAB — HEPATIC FUNCTION PANEL
AST: 22 U/L (ref 0–37)
Albumin: 4.6 g/dL (ref 3.5–5.2)
Alkaline Phosphatase: 51 U/L (ref 39–117)
Bilirubin, Direct: 0.1 mg/dL (ref 0.0–0.3)
Total Protein: 7.9 g/dL (ref 6.0–8.3)

## 2012-04-23 LAB — BASIC METABOLIC PANEL
CO2: 29 mEq/L (ref 19–32)
Calcium: 10.2 mg/dL (ref 8.4–10.5)
Chloride: 104 mEq/L (ref 96–112)
Creatinine, Ser: 1.3 mg/dL — ABNORMAL HIGH (ref 0.4–1.2)
Sodium: 140 mEq/L (ref 135–145)

## 2012-04-23 LAB — TSH: TSH: 2.73 u[IU]/mL (ref 0.35–5.50)

## 2012-04-23 LAB — MICROALBUMIN / CREATININE URINE RATIO: Creatinine,U: 141.5 mg/dL

## 2012-04-23 LAB — LIPID PANEL
HDL: 49.2 mg/dL (ref >39.00)
Total CHOL/HDL Ratio: 3
VLDL: 24.2 mg/dL (ref 0.0–40.0)

## 2012-04-23 MED ORDER — GLUCOSE BLOOD VI STRP: ORAL_STRIP | Status: AC

## 2012-04-23 NOTE — Assessment & Plan Note (Signed)
Adequate control on present rx, reviewed  

## 2012-04-23 NOTE — Assessment & Plan Note (Signed)
She's not following instructions on med calendar re rx for back pain with nsaids, suggested she do so then call for ortho referral if not improving.

## 2012-04-23 NOTE — Assessment & Plan Note (Signed)
Not at target, suspect non-adherence. Will do a trust but verify approach

## 2012-04-23 NOTE — Patient Instructions (Signed)
Please remember to go to the lab and x-ray department downstairs for your tests - we will call you with the results when they are available.    As per your med calendar, always try aleve first for back or joint pain, and if not effective call for referral back to your orthopedic doctor.  Please remember to go to the lab and x-ray department downstairs for your tests - we will call you with the results when they are available.     See Tammy NP w/in 6 weeks with all your medications, even over the counter meds, separated in two separate bags, the ones you take no matter what vs the ones you stop once you feel better and take only as needed when you feel you need them.   Tammy  will generate for you a new user friendly medication calendar that will put [place] all on the same page re: your medication use.     Without this process, it simply isn't possible to assure that we are providing  your outpatient care  with  the attention to detail we feel you deserve.   If we cannot assure that you're getting that kind of care,  then we cannot manage your problem effectively from this clinic.  Once you have seen Tammy and we are sure that we're all on the same page with your medication use she will arrange follow up with me.

## 2012-04-23 NOTE — Assessment & Plan Note (Signed)
hgba1c's trending up, doubt compliance with meds/ diet  Will use a trust but verify approach here before making changes  See instructions for specific recommendations which were reviewed directly with the patient who was given a copy with highlighter outlining the key components.

## 2012-04-23 NOTE — Telephone Encounter (Signed)
Spoke with patient-states she needs Rx for Prodigy test strips sent to [facility] [address]. Pt checks her blood sugars once daily.  Pt aware that Rx has been sent to pharmacy.

## 2012-04-23 NOTE — Progress Notes (Signed)
Subjective:    Patient ID: [name], female    DOB: 1928/11/24  MRN: [id number]   Brief patient profile:  89  yobf quit smoking 1987 with obesity/ aodm with multiple chronic complaints and documented rhinitis with inconsistent compliance with nasal steroids    HPI May 20, 2010 ov c/o waking up with HA almost every am. She states that her back/ left positional shoulder pain is the same- no better or worse, but is resolved with aleve. She c/o dry cough at night x 1-2 wks. bp too high rec double dose of diovan   July 13, 2010 HA and dizziness. She also c/o ringing in both ears x several days. really bad 11/5 better now and hears ok.. no room spinning but dizzy, has not tried meclizine. no ha, ataxia, nausea.   04/20/2011 f/u ov/[name] cc cpx/ leg cramping once or twice a week x months. No pain walking, no numbness.  Occ dizzy but not orthostatic related and goes away before she has time to try her meclizine prn rec Try quinine in the form of tonic water at bedtime as needed for cramps See calendar for specific medication instructions   07/20/2011 f/u ov/Archimedes Harold cc hurting all over every night when lie down with stiffness in am indolent onset, progressively worse not much better with aleve but only takes one a day.  No cough or sob. No overt symptoms of hyper or hypoglycemia.  Recurrent L ant cp x years, worse in sitting position, assoc with constipation.  >>rx trazadone.   07/27/2011 Follow up and Med review Pt returns for follow up and med review. Last visit , trouble with sleeping, Trazadone was added. Pt says she is sleeping much better. Does complain over last 5 days , cough and congestion have started. Complains of head congestion, prod cough with small amounts of yellow mucus x1week .  No hemoptysis . Does have some throat drainage and dry cough at night. We discussed her chlor tabs for this-she has not started this yet. Using mucinex without much help.  We reviewed all her  meds and organized them into her med calendar with update, and pt education  Labs done last ov were essentially unremarkable except for A1C was unchanged at 8.4 - we discussed diet.  rec Zpack Take as directed.  Follow med calendar closely and bring to each visit.    10/24/2011 ov [name]/ f/u multiple chronic problems fasting for f/u lost the new med calendar provided but c/o chest pains went away " awhile ago" not reproducible with exertions.  HA no better, worse with anxiety not in am's and no assoc neuro cos, napping in afternoons sleeping poorly on trazadone 25. rec Please remember to go to the lab > all labs improving including hgba1c down from 8.4 to 7.6 Increase trazadone to 50 mg one at bedtime and no napping during the day until sleeping better at night See calendar for specific medication instructions      01/23/2012 f/u ov/[name] f/u hbp/ dm/hyperlipidemia/ insomnia cc sleeping better on melatonin, more active during the day and no high or low sugar symtoms, no tia or claudication or sob though she's relatively sedentary.  Following meds on calendar well, only stopped the trazadone because found it didn't sleep as well as melatonin.  Only using from prn list occ aleve. rec Follow med calendar   04/23/2012 f/u ov/[name] cc multiple active medical problems dm/ hbp / hyperlipidemia. Main complaint is variable R back pain x 3 months previously injected  but don't remember by whom, no radicular symptoms. Has not tried nsaids though they are already on list for prn. No localized numbness/ weakness/ no pain with cough.  Overall worse with walking, better supine  Sleeping ok without nocturnal  or early am exacerbation  of respiratory  C/o's . Also denies any obvious fluctuation of symptoms with weather or environmental changes or other aggravating or alleviating factors except as outlined above   Denies any obvious fluctuation of symptoms with weather or environmental changes or other aggravating  or alleviating factors except as outlined above   ROS  The following are not active complaints unless bolded sore throat, dysphagia, dental problems, itching, sneezing,  nasal congestion or excess/ purulent secretions, ear ache,   fever, chills, sweats, unintended wt loss, pleuritic or exertional cp, hemoptysis,  orthopnea pnd or leg swelling, presyncope, palpitations, heartburn, abdominal pain, anorexia, nausea, vomiting, diarrhea  or change in bowel or urinary habits, change in stools or urine, dysuria,hematuria,  rash, arthralgias, visual complaints, headache, numbness weakness or ataxia or problems with walking or coordination,  change in mood/affect or memory.                             Past Medical History:  HYPERTENSION (ICD-401.9)  HYPERLIPIDEMIA (ICD-272.4)  - target --LDL <70 (DM) due to DM  AODM  CHRONIC COUGH  - onset 1990's, almost daly since> resolved May 04, 2009 with ppi two times a day and chlortrimeton  - denied improved on ppi so try off effective August 19, 2009 > worse cough and constipation > restarted  CHRONIC RHINITIS (ICD-472.0)-non adherent with nasal steroids  -Ct sinus nml (08/1999, 01/2005)  Hx of URTICARIA (ICD-708.9)  LUMBAR RADICULOPATHY, RIGHT (ICD-724.4)  DEPRESSION (ICD-311)   MORBID OBESITY (ICD-278.01)  - Target wt = 158 for BMI < 30  VIT D DEFICIENCY dx 08/28/08  - Rx with 50 k biw x 12weeks 09/01/08 > recheck 04/20/2011  Osteopenia  -BMD 04/2000 -nml (-0.4 hip)  -BMD 09/15/08 T spine 3.3, L Fem -1.0, R Fem - 1.0  HEALTH MAINENANCE.......................................................................................Marland KitchenWert  -Td 09/2004 -Pneumovax (1998), 05/02/2008  -Mammogram (9/08)-nml  - Colonsocopy nl 05/2008............................................................................Marland KitchenLina Sar  -GYN......................................................................................................... Marcelle Overlie   -CPX  04/23/2012    -refused flu shot August 18, 2009 and May 20, 2010  COMPLEX MED REGIMEN  -- med calendar completed/adjusted reviewed November 18, 2009 but not following > using it May 20, 2010 , 07/27/2011             Objective:   Physical Exam  wt   155 Jan 25, 2010 > Wt 153 04/20/2011 >  10/24/2011 152 > 01/23/2012 151> 04/23/2012  149 amb bf nad/ brighter affect HEENT: edentulous with dentures in place, nl turbinates, and orophanx. Nl external ear canals without cough reflex  NECK : without JVD/Nodes/TM/ nl carotid upstrokes bilaterally  LUNGS: no acc muscle use, clear to A and P bilaterally without cough on insp or exp maneuvers  CV: RRR no s3 or murmur or increase in P2, no edema .  Pulses sym bilaterally in feet ABD: soft and nontender with nl excursion in the supine position. No bruits or organomegaly, bowel sounds nl  MS: warm without deformities, calf tenderness, cyanosis or clubbing Neuro alert, no motor, cerebellar deficits, nl gait.  Skin: no lesions    CXR  04/23/2012 :  No active disease. Stable degenerative changes thoracic spine.       Assessment & Plan:

## 2012-04-26 ENCOUNTER — Other Ambulatory Visit: Payer: Self-pay | Admitting: Internal Medicine

## 2012-04-27 DIAGNOSIS — Z961 Presence of intraocular lens: Secondary | ICD-10-CM | POA: Diagnosis not present

## 2012-04-27 DIAGNOSIS — E119 Type 2 diabetes mellitus without complications: Secondary | ICD-10-CM | POA: Diagnosis not present

## 2012-04-27 DIAGNOSIS — H2640 Unspecified secondary cataract: Secondary | ICD-10-CM | POA: Diagnosis not present

## 2012-04-27 DIAGNOSIS — H52209 Unspecified astigmatism, unspecified eye: Secondary | ICD-10-CM | POA: Diagnosis not present

## 2012-04-30 NOTE — Progress Notes (Signed)
Quick Note:  Spoke with pt and notified of results per Dr. Sherene Sires. Pt verbalized understanding and denied any questions. She will keep ov with TP ______

## 2012-06-01 ENCOUNTER — Other Ambulatory Visit: Payer: Self-pay | Admitting: Internal Medicine

## 2012-06-04 ENCOUNTER — Ambulatory Visit (INDEPENDENT_AMBULATORY_CARE_PROVIDER_SITE_OTHER): Payer: Medicare Other | Admitting: Adult Health

## 2012-06-04 ENCOUNTER — Encounter: Payer: Self-pay | Admitting: Adult Health

## 2012-06-04 VITALS — BP 156/92 | HR 77 | Temp 97.1°F | Ht 62.0 in | Wt 152.2 lb

## 2012-06-04 DIAGNOSIS — E119 Type 2 diabetes mellitus without complications: Secondary | ICD-10-CM

## 2012-06-04 DIAGNOSIS — M2550 Pain in unspecified joint: Secondary | ICD-10-CM

## 2012-06-04 DIAGNOSIS — I10 Essential (primary) hypertension: Secondary | ICD-10-CM

## 2012-06-04 MED ORDER — HYDROCODONE-ACETAMINOPHEN 5-500 MG PO TABS: 1.0000 | ORAL_TABLET | Freq: Four times a day (QID) | ORAL | Status: DC | PRN

## 2012-06-04 MED ORDER — GLIMEPIRIDE 4 MG PO TABS: 4.0000 mg | ORAL_TABLET | Freq: Every day | ORAL | Status: DC

## 2012-06-04 NOTE — Progress Notes (Signed)
Subjective:    Patient ID: [name], female    DOB: 1929/03/28  MRN: [id number]   Brief patient profile:  58  yobf quit smoking 1987 with obesity/ aodm with multiple chronic complaints and documented rhinitis with inconsistent compliance with nasal steroids    HPI May 20, 2010 ov c/o waking up with HA almost every am. She states that her back/ left positional shoulder pain is the same- no better or worse, but is resolved with aleve. She c/o dry cough at night x 1-2 wks. bp too high rec double dose of diovan   July 13, 2010 HA and dizziness. She also c/o ringing in both ears x several days. really bad 11/5 better now and hears ok.. no room spinning but dizzy, has not tried meclizine. no ha, ataxia, nausea.   04/20/2011 f/u ov/[name] cc cpx/ leg cramping once or twice a week x months. No pain walking, no numbness.  Occ dizzy but not orthostatic related and goes away before she has time to try her meclizine prn rec Try quinine in the form of tonic water at bedtime as needed for cramps See calendar for specific medication instructions   07/20/2011 f/u ov/[name] cc hurting all over every night when lie down with stiffness in am indolent onset, progressively worse not much better with aleve but only takes one a day.  No cough or sob. No overt symptoms of hyper or hypoglycemia.  Recurrent L ant cp x years, worse in sitting position, assoc with constipation.  >>rx trazadone.   07/27/2011 Follow up and Med review Pt returns for follow up and med review. Last visit , trouble with sleeping, Trazadone was added. Pt says she is sleeping much better. Does complain over last 5 days , cough and congestion have started. Complains of head congestion, prod cough with small amounts of yellow mucus x1week .  No hemoptysis . Does have some throat drainage and dry cough at night. We discussed her chlor tabs for this-she has not started this yet. Using mucinex without much help.  We reviewed all her  meds and organized them into her med calendar with update, and pt education  Labs done last ov were essentially unremarkable except for A1C was unchanged at 8.4 - we discussed diet.  rec Zpack Take as directed.  Follow med calendar closely and bring to each visit.    10/24/2011 ov [name]/ f/u multiple chronic problems fasting for f/u lost the new med calendar provided but c/o chest pains went away " awhile ago" not reproducible with exertions.  HA no better, worse with anxiety not in am's and no assoc neuro cos, napping in afternoons sleeping poorly on trazadone 25. rec Please remember to go to the lab > all labs improving including hgba1c down from 8.4 to 7.6 Increase trazadone to 50 mg one at bedtime and no napping during the day until sleeping better at night See calendar for specific medication instructions      01/23/2012 f/u ov/[name] f/u hbp/ dm/hyperlipidemia/ insomnia cc sleeping better on melatonin, more active during the day and no high or low sugar symtoms, no tia or claudication or sob though she's relatively sedentary.  Following meds on calendar well, only stopped the trazadone because found it didn't sleep as well as melatonin.  Only using from prn list occ aleve. rec Follow med calendar   04/23/2012 f/u ov/[name] cc multiple active medical problems dm/ hbp / hyperlipidemia. Main complaint is variable R back pain x 3 months previously injected  but don't remember by whom, no radicular symptoms. Has not tried nsaids though they are already on list for prn. No localized numbness/ weakness/ no pain with cough.  Overall worse with walking, better supine     >>aleve, fasting labs , tspine -DJD changes   06/04/2012 Follow up and med review    We reviewed all her meds and organized them into her med calendar with update, and pt education  Appears she is taking meds correctly and consistently.  CPX last ov showed DM not as well controlled with  A1C up 8.5 from 7.6  We discussed diet and  need to increased Amaryl. No low sugars on current regimen.  B/P has been running up 140-160.  She has been taking Aleve on regular basis for joint pains.  We discussed stopping NSAIDS and using tylenol arthritis and vicodin for severe pain.  Renal fxn was worse with scr at 1.3  No cp, dyspnea or edema.     Past Medical History:  HYPERTENSION (ICD-401.9)  HYPERLIPIDEMIA (ICD-272.4)  - target --LDL <70 (DM) due to DM  AODM  CHRONIC COUGH  - onset 1990's, almost daly since> resolved May 04, 2009 with ppi two times a day and chlortrimeton  - denied improved on ppi so try off effective August 19, 2009 > worse cough and constipation > restarted  CHRONIC RHINITIS (ICD-472.0)-non adherent with nasal steroids  -Ct sinus nml (08/1999, 01/2005)  Hx of URTICARIA (ICD-708.9)  LUMBAR RADICULOPATHY, RIGHT (ICD-724.4)  DEPRESSION (ICD-311)   MORBID OBESITY (ICD-278.01)  - Target wt = 158 for BMI < 30  VIT D DEFICIENCY dx 08/28/08  - Rx with 50 k biw x 12weeks 09/01/08 > recheck 04/20/2011  Osteopenia  -BMD 04/2000 -nml (-0.4 hip)  -BMD 09/15/08 T spine 3.3, L Fem -1.0, R Fem - 1.0  HEALTH MAINENANCE.......................................................................................Marland KitchenWert  -Td 09/2004 -Pneumovax (1998), 05/02/2008  -Mammogram (9/08)-nml , >06/04/2012 >> - Colonsocopy nl 05/2008............................................................................Marland KitchenLina Maddox  -GYN......................................................................................................... [name]   -CPX  04/23/2012  -refused flu shot August 18, 2009 and May 20, 2010 , 06/04/2012  COMPLEX MED REGIMEN  -- med calendar completed/adjusted reviewed November 18, 2009 but not following > using it May 20, 2010 , 07/27/2011 , 06/04/2012      ROS:  Constitutional:   No  weight loss, night sweats,  Fevers, chills, fatigue, or  lassitude.  HEENT:   No headaches,  Difficulty swallowing,   Tooth/dental problems, or  Sore throat,                No sneezing, itching, ear ache, + nasal congestion, post nasal drip,   CV:  No chest pain,  Orthopnea, PND, swelling in lower extremities, anasarca, dizziness, palpitations, syncope.   GI  No heartburn, indigestion, abdominal pain, nausea, vomiting, diarrhea, change in bowel habits, loss of appetite, bloody stools.   Resp:  .  No change in color of mucus.  .  No chest wall deformity  Skin: no rash or lesions.  GU: no dysuria, change in color of urine, no urgency or frequency.  No flank pain, no hematuria   MS:  No joint  swelling.     Psych:  No change in mood or affect. No depression or anxiety.  No memory loss.           Objective:   Physical Exam  wt   155 Jan 25, 2010 > Wt 153 04/20/2011 >  10/24/2011 152 > 01/23/2012 151> 04/23/2012  149>152 06/04/2012  amb bf nad HEENT: edentulous  with dentures in place, nl turbinates, and orophanx. Nl external ear canals without cough reflex  NECK : without JVD/Nodes/TM/ nl carotid upstrokes bilaterally  LUNGS: no acc muscle use, clear to A and P bilaterally without cough on insp or exp maneuvers  CV: RRR no s3 or murmur or increase in P2, no edema .  Pulses sym bilaterally in feet ABD: soft and nontender with nl excursion in the supine position. No bruits or organomegaly, bowel sounds nl  MS: warm without deformities, calf tenderness, cyanosis or clubbing Neuro alert, no motor, cerebellar deficits, nl gait.  Skin: no lesions    CXR  04/23/2012 :  No active disease. Stable degenerative changes thoracic spine.       Assessment & Plan:

## 2012-06-04 NOTE — Assessment & Plan Note (Signed)
Not at goal  Stop Aleve, avoid NSAIDS -advil, excederin -they can raise blood pressure and affect the kidneys.  I Low salt diet   Follow med calendar closely and bring to each visit  Follow up 4 weeks with b/p and blood sugar log.  Please contact office for sooner follow up if symptoms do not improve or worsen or seek emergency care

## 2012-06-04 NOTE — Assessment & Plan Note (Signed)
Stop nsaids due to b/p/renal fxn  Use tylenol arthritis and vicodin As needed   May consider tramadol on return if needed.

## 2012-06-04 NOTE — Assessment & Plan Note (Signed)
Not at optimal control  Patient's medications were reviewed today and patient education was given. Computerized medication calendar was adjusted/completed  Plan  Increase Glimepiride 4mg  daily w/ food  Check sugars daily , call if <80 or >160    Low sweet diet  Follow med calendar closely and bring to each visit    Please contact office for sooner follow up if symptoms do not improve or worsen or seek emergency care

## 2012-06-04 NOTE — Patient Instructions (Addendum)
Stop Aleve, avoid NSAIDS -advil, excederin -they can raise blood pressure and affect the kidneys.  Increase Glimepiride 4mg  daily w/ food  Check sugars daily , call if <80 or >200 Check blood pressures -call if >160   Low salt diet  Low sweet diet  Follow med calendar closely and bring to each visit  Follow up 4 weeks with b/p and blood sugar log.  Please contact office for sooner follow up if symptoms do not improve or worsen or seek emergency care

## 2012-06-04 NOTE — Addendum Note (Signed)
Addended by: Boone Master E on: 06/04/2012 04:55 PM   Modules accepted: Orders

## 2012-06-08 ENCOUNTER — Other Ambulatory Visit: Payer: Self-pay | Admitting: Adult Health

## 2012-06-08 NOTE — Addendum Note (Signed)
Addended by: Boone Master E on: 06/08/2012 05:26 PM   Modules accepted: Orders

## 2012-06-26 DIAGNOSIS — E119 Type 2 diabetes mellitus without complications: Secondary | ICD-10-CM | POA: Diagnosis not present

## 2012-06-26 DIAGNOSIS — H11449 Conjunctival cysts, unspecified eye: Secondary | ICD-10-CM | POA: Diagnosis not present

## 2012-07-02 ENCOUNTER — Encounter: Payer: Self-pay | Admitting: Adult Health

## 2012-07-02 ENCOUNTER — Ambulatory Visit (INDEPENDENT_AMBULATORY_CARE_PROVIDER_SITE_OTHER): Payer: Medicare Other | Admitting: Adult Health

## 2012-07-02 ENCOUNTER — Other Ambulatory Visit (INDEPENDENT_AMBULATORY_CARE_PROVIDER_SITE_OTHER): Payer: Medicare Other

## 2012-07-02 VITALS — BP 142/78 | HR 78 | Temp 97.1°F | Ht 62.0 in | Wt 150.2 lb

## 2012-07-02 DIAGNOSIS — E119 Type 2 diabetes mellitus without complications: Secondary | ICD-10-CM | POA: Diagnosis not present

## 2012-07-02 DIAGNOSIS — I10 Essential (primary) hypertension: Secondary | ICD-10-CM | POA: Diagnosis not present

## 2012-07-02 LAB — BASIC METABOLIC PANEL
BUN: 23 mg/dL (ref 6–23)
Calcium: 9.9 mg/dL (ref 8.4–10.5)
Creatinine, Ser: 1.3 mg/dL — ABNORMAL HIGH (ref 0.4–1.2)
GFR: 52.83 mL/min — ABNORMAL LOW (ref >60.00)

## 2012-07-02 MED ORDER — VALSARTAN-HYDROCHLOROTHIAZIDE 320-12.5 MG PO TABS: 1.0000 | ORAL_TABLET | Freq: Every day | ORAL | Status: DC

## 2012-07-02 NOTE — Assessment & Plan Note (Signed)
Improved control w/ no hypogylcemia on increased dose of Amaryl   Cont on same rx Diet discussed

## 2012-07-02 NOTE — Progress Notes (Signed)
Subjective:    Patient ID: [name], female    DOB: 30-Jun-1929  MRN: [id number]   Brief patient profile:  89  yobf quit smoking 1987 with obesity/ aodm with multiple chronic complaints and documented rhinitis with inconsistent compliance with nasal steroids   May 20, 2010 ov c/o waking up with HA almost every am. She states that her back/ left positional shoulder pain is the same- no better or worse, but is resolved with aleve. She c/o dry cough at night x 1-2 wks. bp too high rec double dose of diovan   July 13, 2010 HA and dizziness. She also c/o ringing in both ears x several days. really bad 11/5 better now and hears ok.. no room spinning but dizzy, has not tried meclizine. no ha, ataxia, nausea.   04/20/2011 f/u ov/[name] cc cpx/ leg cramping once or twice a week x months. No pain walking, no numbness.  Occ dizzy but not orthostatic related and goes away before she has time to try her meclizine prn rec Try quinine in the form of tonic water at bedtime as needed for cramps See calendar for specific medication instructions   07/20/2011 f/u ov/[name] cc hurting all over every night when lie down with stiffness in am indolent onset, progressively worse not much better with aleve but only takes one a day.  No cough or sob. No overt symptoms of hyper or hypoglycemia.  Recurrent L ant cp x years, worse in sitting position, assoc with constipation.  >>rx trazadone.   07/27/2011 Follow up and Med review Pt returns for follow up and med review. Last visit , trouble with sleeping, Trazadone was added. Pt says she is sleeping much better. Does complain over last 5 days , cough and congestion have started. Complains of head congestion, prod cough with small amounts of yellow mucus x1week .  No hemoptysis . Does have some throat drainage and dry cough at night. We discussed her chlor tabs for this-she has not started this yet. Using mucinex without much help.  We reviewed all her meds and  organized them into her med calendar with update, and pt education  Labs done last ov were essentially unremarkable except for A1C was unchanged at 8.4 - we discussed diet.  rec Zpack Take as directed.  Follow med calendar closely and bring to each visit.    10/24/2011 ov [name]/ f/u multiple chronic problems fasting for f/u lost the new med calendar provided but c/o chest pains went away " awhile ago" not reproducible with exertions.  HA no better, worse with anxiety not in am's and no assoc neuro cos, napping in afternoons sleeping poorly on trazadone 25. rec Please remember to go to the lab > all labs improving including hgba1c down from 8.4 to 7.6 Increase trazadone to 50 mg one at bedtime and no napping during the day until sleeping better at night See calendar for specific medication instructions      01/23/2012 f/u ov/[name] f/u hbp/ dm/hyperlipidemia/ insomnia cc sleeping better on melatonin, more active during the day and no high or low sugar symtoms, no tia or claudication or sob though she's relatively sedentary.  Following meds on calendar well, only stopped the trazadone because found it didn't sleep as well as melatonin.  Only using from prn list occ aleve. rec Follow med calendar   04/23/2012 f/u ov/[name] cc multiple active medical problems dm/ hbp / hyperlipidemia. Main complaint is variable R back pain x 3 months previously injected but don't  remember by whom, no radicular symptoms. Has not tried nsaids though they are already on list for prn. No localized numbness/ weakness/ no pain with cough.  Overall worse with walking, better supine     >>aleve, fasting labs , tspine -DJD changes   06/04/2012 Follow up and med review   We reviewed all her meds and organized them into her med calendar with update, and pt education  Appears she is taking meds correctly and consistently.  CPX last ov showed DM not as well controlled with  A1C up 8.5 from 7.6  We discussed diet and need to  increased Amaryl. No low sugars on current regimen.  B/P has been running up 140-160.  She has been taking Aleve on regular basis for joint pains.  We discussed stopping NSAIDS and using tylenol arthritis and vicodin for severe pain.  Renal fxn was worse with scr at 1.3  No cp, dyspnea or edema.  >>Stop NSAIDS , Increase Glimepiride 4mg  daily  07/02/2012 Follow up  Patient returns for a 4 week followup for hypertension and diabetes. Last visit blood pressure was elevated. She was recommended to stop all NSAIDs Blood sugars were also elevated. She was instructed to increase glimepiride 4 mg daily Blood sugars have been averaging around 100-150 fasting, she has had no hypoglycemia episodes. Blood pressures are averaging 140 systolic She denies any chest pain, orthopnea, PND, or leg swelling     Past Medical History:  HYPERTENSION (ICD-401.9)  HYPERLIPIDEMIA (ICD-272.4)  - target --LDL <70 (DM) due to DM  AODM  CHRONIC COUGH  - onset 1990's, almost daly since> resolved May 04, 2009 with ppi two times a day and chlortrimeton  - denied improved on ppi so try off effective August 19, 2009 > worse cough and constipation > restarted  CHRONIC RHINITIS (ICD-472.0)-non adherent with nasal steroids  -Ct sinus nml (08/1999, 01/2005)  Hx of URTICARIA (ICD-708.9)  LUMBAR RADICULOPATHY, RIGHT (ICD-724.4)  DEPRESSION (ICD-311)   MORBID OBESITY (ICD-278.01)  - Target wt = 158 for BMI < 30  VIT D DEFICIENCY dx 08/28/08  - Rx with 50 k biw x 12weeks 09/01/08 > recheck 04/20/2011  Osteopenia  -BMD 04/2000 -nml (-0.4 hip)  -BMD 09/15/08 T spine 3.3, L Fem -1.0, R Fem - 1.0  HEALTH MAINENANCE.......................................................................................Marland KitchenWert  -Td 09/2004 -Pneumovax (1998), 05/02/2008  -Mammogram (9/08)-nml , >06/04/2012 >> - Colonsocopy nl 05/2008............................................................................Marland KitchenLina Maddox    -GYN......................................................................................................... [name]   -CPX  04/23/2012  -refused flu shot August 18, 2009 and May 20, 2010 , 06/04/2012  COMPLEX MED REGIMEN  -- med calendar completed/adjusted reviewed November 18, 2009 but not following > using it May 20, 2010 , 07/27/2011 , 06/04/2012      ROS:  Constitutional:   No  weight loss, night sweats,  Fevers, chills, fatigue, or  lassitude.  HEENT:   No headaches,  Difficulty swallowing,  Tooth/dental problems, or  Sore throat,                No sneezing, itching, ear ache, nasal congestion, post nasal drip,   CV:  No chest pain,  Orthopnea, PND, swelling in lower extremities, anasarca, dizziness, palpitations, syncope.   GI  No heartburn, indigestion, abdominal pain, nausea, vomiting, diarrhea, change in bowel habits, loss of appetite, bloody stools.   Resp:  .  No change in color of mucus.  .  No chest wall deformity  Skin: no rash or lesions.  GU: no dysuria, change in color of urine, no urgency or frequency.  No flank pain, no hematuria   MS:  No joint  swelling.     Psych:  No change in mood or affect. No depression or anxiety.  No memory loss.           Objective:   Physical Exam  wt   155 Jan 25, 2010 > Wt 153 04/20/2011 >  10/24/2011 152 > 01/23/2012 151> 04/23/2012  149>152 06/04/2012 >150  amb bf nad HEENT: edentulous with dentures in place, nl turbinates, and orophanx. Nl external ear canals without cough reflex  NECK : without JVD/Nodes/TM/ nl carotid upstrokes bilaterally  LUNGS: no acc muscle use, clear to A and P bilaterally without cough on insp or exp maneuvers  CV: RRR no s3 or murmur or increase in P2, no edema .  Pulses sym bilaterally in feet ABD: soft and nontender with nl excursion in the supine position. No bruits or organomegaly, bowel sounds nl  MS: warm without deformities, calf tenderness, cyanosis or clubbing Neuro alert, no  motor, cerebellar deficits, nl gait.  Skin: no lesions    CXR  04/23/2012 :  No active disease. Stable degenerative changes thoracic spine.       Assessment & Plan:

## 2012-07-02 NOTE — Patient Instructions (Addendum)
Change Diovan 320/12.5mg  daily  Avoid NSAIDS -advil, excederin -they can raise blood pressure and affect the kidneys.  Check sugars 3 days a week ,  call if <80 or >200 Check blood pressures 3 days a week -call if >160   Low salt diet  Low sweet diet   I will call with labs  Please contact office for sooner follow up if symptoms do not improve or worsen or seek emergency care

## 2012-07-02 NOTE — Assessment & Plan Note (Signed)
Not at goal -will change Diovan  If not at goal on return will need to add rx   Plan;   Change Diovan 320/12.5mg  daily  Avoid NSAIDS -advil, excederin -they can raise blood pressure and affect the kidneys.  Check sugars 3 days a week ,  call if <80 or >200 Check blood pressures 3 days a week -call if >160   Low salt diet  Low sweet diet   I will call with labs  Please contact office for sooner follow up if symptoms do not improve or worsen or seek emergency care  follow up 3 months and As needed

## 2012-07-16 DIAGNOSIS — H11449 Conjunctival cysts, unspecified eye: Secondary | ICD-10-CM | POA: Diagnosis not present

## 2012-07-19 DIAGNOSIS — Z1231 Encounter for screening mammogram for malignant neoplasm of breast: Secondary | ICD-10-CM | POA: Diagnosis not present

## 2012-07-19 DIAGNOSIS — Z803 Family history of malignant neoplasm of breast: Secondary | ICD-10-CM | POA: Diagnosis not present

## 2012-08-03 ENCOUNTER — Encounter: Payer: Self-pay | Admitting: Internal Medicine

## 2012-08-30 ENCOUNTER — Other Ambulatory Visit: Payer: Self-pay | Admitting: Internal Medicine

## 2012-09-07 ENCOUNTER — Telehealth: Payer: Self-pay | Admitting: Internal Medicine

## 2012-09-07 MED ORDER — AMOXICILLIN-POT CLAVULANATE 875-125 MG PO TABS: 1.0000 | ORAL_TABLET | Freq: Two times a day (BID) | ORAL | Status: DC

## 2012-09-07 NOTE — Telephone Encounter (Signed)
Cough and congestion for 4 days , fever this am 101  Can not get to office this evening.  Advised >Augmentin 875mg  Twice daily  For 7 days  OV if not irmpoving  ER if worse over weekend  mucinex As needed   Fluids and rest  Tylenol As needed   Please contact office for sooner follow up if symptoms do not improve or worsen or seek emergency care

## 2012-09-07 NOTE — Telephone Encounter (Signed)
Called spoke with patient, advised of TP's recs as stated below.  Pt aware to call back Monday if she is not better and to seek emergency care over the weekend if she worsens.  Augmentin sent to verified pharmacy.

## 2012-09-07 NOTE — Telephone Encounter (Signed)
Spoke with pt She c/o sore throat, body aches, fever of 101, fatigue, and cough with minimal clear sputum She feels has the flu, did not get vaccinated Please advise recs thanks Allergies  Allergen Reactions  . Albuterol   . Doxycycline Nausea And Vomiting

## 2012-09-07 NOTE — Telephone Encounter (Signed)
Pt called back again re: same. [name]  °

## 2012-10-29 ENCOUNTER — Other Ambulatory Visit: Payer: Self-pay | Admitting: Internal Medicine

## 2012-11-29 ENCOUNTER — Other Ambulatory Visit: Payer: Self-pay | Admitting: Internal Medicine

## 2012-11-29 ENCOUNTER — Other Ambulatory Visit: Payer: Self-pay | Admitting: Adult Health

## 2013-01-05 ENCOUNTER — Other Ambulatory Visit: Payer: Self-pay | Admitting: Adult Health

## 2013-01-10 ENCOUNTER — Other Ambulatory Visit (INDEPENDENT_AMBULATORY_CARE_PROVIDER_SITE_OTHER): Payer: Medicare Other

## 2013-01-10 ENCOUNTER — Ambulatory Visit (INDEPENDENT_AMBULATORY_CARE_PROVIDER_SITE_OTHER)
Admission: RE | Admit: 2013-01-10 | Discharge: 2013-01-10 | Disposition: A | Discharge status: Home / Self Care | Payer: Medicare Other | Source: Ambulatory Visit | Attending: Internal Medicine | Admitting: Internal Medicine

## 2013-01-10 ENCOUNTER — Ambulatory Visit (INDEPENDENT_AMBULATORY_CARE_PROVIDER_SITE_OTHER): Payer: Medicare Other | Admitting: Internal Medicine

## 2013-01-10 ENCOUNTER — Encounter: Payer: Self-pay | Admitting: Internal Medicine

## 2013-01-10 VITALS — BP 138/76 | HR 74 | Temp 98.3°F | Ht 64.0 in | Wt 147.0 lb

## 2013-01-10 DIAGNOSIS — M2550 Pain in unspecified joint: Secondary | ICD-10-CM

## 2013-01-10 DIAGNOSIS — E119 Type 2 diabetes mellitus without complications: Secondary | ICD-10-CM | POA: Diagnosis not present

## 2013-01-10 DIAGNOSIS — J42 Unspecified chronic bronchitis: Secondary | ICD-10-CM | POA: Diagnosis not present

## 2013-01-10 DIAGNOSIS — I10 Essential (primary) hypertension: Secondary | ICD-10-CM

## 2013-01-10 DIAGNOSIS — E785 Hyperlipidemia, unspecified: Secondary | ICD-10-CM

## 2013-01-10 LAB — URINALYSIS, ROUTINE W REFLEX MICROSCOPIC
Bilirubin Urine: NEGATIVE
Ketones, ur: NEGATIVE
Urobilinogen, UA: 0.2 (ref 0.0–1.0)

## 2013-01-10 LAB — HEPATIC FUNCTION PANEL
ALT: 19 U/L (ref 0–35)
Total Bilirubin: 0.5 mg/dL (ref 0.3–1.2)

## 2013-01-10 LAB — CBC WITH DIFFERENTIAL/PLATELET
Basophils Absolute: 0 10*3/uL (ref 0.0–0.1)
Eosinophils Absolute: 0.3 10*3/uL (ref 0.0–0.7)
HCT: 40.2 % (ref 36.0–46.0)
Hemoglobin: 13.3 g/dL (ref 12.0–15.0)
Lymphs Abs: 2 10*3/uL (ref 0.7–4.0)
MCHC: 33.2 g/dL (ref 30.0–36.0)
Neutro Abs: 4 10*3/uL (ref 1.4–7.7)
RDW: 16.2 % — ABNORMAL HIGH (ref 11.5–14.6)

## 2013-01-10 LAB — LDL CHOLESTEROL, DIRECT: Direct LDL: 139.9 mg/dL

## 2013-01-10 LAB — LIPID PANEL
Cholesterol: 215 mg/dL — ABNORMAL HIGH (ref 0–200)
HDL: 41.3 mg/dL (ref >39.00)
Total CHOL/HDL Ratio: 5
Triglycerides: 169 mg/dL — ABNORMAL HIGH (ref 0.0–149.0)

## 2013-01-10 LAB — BASIC METABOLIC PANEL
BUN: 23 mg/dL (ref 6–23)
GFR: 51.8 mL/min — ABNORMAL LOW (ref >60.00)
Glucose, Bld: 123 mg/dL — ABNORMAL HIGH (ref 70–99)
Potassium: 4.5 mEq/L (ref 3.5–5.1)

## 2013-01-10 MED ORDER — PROMETHAZINE HCL 12.5 MG PO TABS: 12.5000 mg | ORAL_TABLET | Freq: Four times a day (QID) | ORAL | Status: DC | PRN

## 2013-01-10 NOTE — Progress Notes (Signed)
Subjective:    Patient ID: [name], female    DOB: 30-Jun-1929  MRN: [id number]   Brief patient profile:  89  yobf quit smoking 1987 with obesity/ aodm with multiple chronic complaints and documented rhinitis with inconsistent compliance with nasal steroids   May 20, 2010 ov c/o waking up with HA almost every am. She states that her back/ left positional shoulder pain is the same- no better or worse, but is resolved with aleve. She c/o dry cough at night x 1-2 wks. bp too high rec double dose of diovan   July 13, 2010 HA and dizziness. She also c/o ringing in both ears x several days. really bad 11/5 better now and hears ok.. no room spinning but dizzy, has not tried meclizine. no ha, ataxia, nausea.   04/20/2011 f/u ov/Codi Kertz cc cpx/ leg cramping once or twice a week x months. No pain walking, no numbness.  Occ dizzy but not orthostatic related and goes away before she has time to try her meclizine prn rec Try quinine in the form of tonic water at bedtime as needed for cramps See calendar for specific medication instructions   07/20/2011 f/u ov/Vignesh Willert cc hurting all over every night when lie down with stiffness in am indolent onset, progressively worse not much better with aleve but only takes one a day.  No cough or sob. No overt symptoms of hyper or hypoglycemia.  Recurrent L ant cp x years, worse in sitting position, assoc with constipation.  >>rx trazadone.   07/27/2011 Follow up and Med review Pt returns for follow up and med review. Last visit , trouble with sleeping, Trazadone was added. Pt says she is sleeping much better. Does complain over last 5 days , cough and congestion have started. Complains of head congestion, prod cough with small amounts of yellow mucus x1week .  No hemoptysis . Does have some throat drainage and dry cough at night. We discussed her chlor tabs for this-she has not started this yet. Using mucinex without much help.  We reviewed all her meds and  organized them into her med calendar with update, and pt education  Labs done last ov were essentially unremarkable except for A1C was unchanged at 8.4 - we discussed diet.  rec Zpack Take as directed.  Follow med calendar closely and bring to each visit.    10/24/2011 ov Shine Scrogham/ f/u multiple chronic problems fasting for f/u lost the new med calendar provided but c/o chest pains went away " awhile ago" not reproducible with exertions.  HA no better, worse with anxiety not in am's and no assoc neuro cos, napping in afternoons sleeping poorly on trazadone 25. rec Please remember to go to the lab > all labs improving including hgba1c down from 8.4 to 7.6 Increase trazadone to 50 mg one at bedtime and no napping during the day until sleeping better at night See calendar for specific medication instructions      01/23/2012 f/u ov/Yuvaan Olander f/u hbp/ dm/hyperlipidemia/ insomnia cc sleeping better on melatonin, more active during the day and no high or low sugar symtoms, no tia or claudication or sob though she's relatively sedentary.  Following meds on calendar well, only stopped the trazadone because found it didn't sleep as well as melatonin.  Only using from prn list occ aleve. rec Follow med calendar   04/23/2012 f/u ov/Rishabh Rinkenberger cc multiple active medical problems dm/ hbp / hyperlipidemia. Main complaint is variable R back pain x 3 months previously injected but don't  remember by whom, no radicular symptoms. Has not tried nsaids though they are already on list for prn. No localized numbness/ weakness/ no pain with cough.  Overall worse with walking, better supine     >>aleve, fasting labs , tspine -DJD changes   06/04/2012 Follow up and med review   We reviewed all her meds and organized them into her med calendar with update, and pt education  Appears she is taking meds correctly and consistently.  CPX last ov showed DM not as well controlled with  A1C up 8.5 from 7.6  We discussed diet and need to  increased Amaryl. No low sugars on current regimen.  B/P has been running up 140-160.  She has been taking Aleve on regular basis for joint pains.  We discussed stopping NSAIDS and using tylenol arthritis and vicodin for severe pain.  Renal fxn was worse with scr at 1.3  No cp, dyspnea or edema.  >>Stop NSAIDS , Increase Glimepiride 4mg  daily  07/02/2012 Follow up  Patient returns for a 4 week followup for hypertension and diabetes. Last visit blood pressure was elevated. She was recommended to stop all NSAIDs Blood sugars were also elevated. She was instructed to increase glimepiride 4 mg daily Blood sugars have been averaging around 100-150 fasting, she has had no hypoglycemia episodes. Blood pressures are averaging 140 systolic She denies any chest pain, orthopnea, PND, or leg swelling rec Change Diovan 320/12.5mg  daily  Avoid NSAIDS -advil, excederin -they can raise blood pressure and affect the kidneys.  Check sugars 3 days a week ,  call if <80 or >200 Check blood pressures 3 days a week -call if >160    01/10/2013 f/u ov/Jafet Wissing multiple chronic issues hbp, dm, arthritis Chief Complaint  Patient presents with  . Acute Visit    Pt c/o fatigue, nausea and "feeling jittery" x 2 wks.     No relation to meals or activity, no cp or ha or sob, symptoms come and go day > nightly  Sleeping ok without nocturnal  or early am exacerbation  of respiratory  c/o's or need for noct saba. Also denies any obvious fluctuation of symptoms with weather or environmental changes or other aggravating or alleviating factors except as outlined above   Current Medications, Allergies, Past Medical History, Past Surgical History, Family History, and Social History were reviewed in [employer] record.  ROS  The following are not active complaints unless bolded sore throat, dysphagia, dental problems, itching, sneezing,  nasal congestion or excess/ purulent secretions, ear ache,   fever,  chills, sweats, unintended wt loss, pleuritic or exertional cp, hemoptysis,  orthopnea pnd or leg swelling, presyncope, palpitations, heartburn, abdominal pain, anorexia, nausea, vomiting, diarrhea  or change in bowel or urinary habits, change in stools or urine, dysuria,hematuria,  rash, arthralgias, visual complaints, headache, numbness weakness or ataxia or problems with walking or coordination,  change in mood/affect or memory.           Past Medical History:  HYPERTENSION (ICD-401.9)  HYPERLIPIDEMIA (ICD-272.4)  - target --LDL <70 (DM) due to DM  AODM  CHRONIC COUGH  - onset 1990's, almost daly since> resolved May 04, 2009 with ppi two times a day and chlortrimeton  - denied improved on ppi so try off effective August 19, 2009 > worse cough and constipation > restarted  CHRONIC RHINITIS (ICD-472.0)-non adherent with nasal steroids  -Ct sinus nml (08/1999, 01/2005)  Hx of URTICARIA (ICD-708.9)  LUMBAR RADICULOPATHY, RIGHT (ICD-724.4)  DEPRESSION (ICD-311)  MORBID OBESITY (ICD-278.01)  - Target wt = 158 for BMI < 30  VIT D DEFICIENCY dx 08/28/08  - Rx with 50 k biw x 12weeks 09/01/08 > recheck 04/20/2011  Osteopenia  -BMD 04/2000 -nml (-0.4 hip)  -BMD 09/15/08 T spine 3.3, L Fem -1.0, R Fem - 1.0  HEALTH MAINENANCE.......................................................................................Marland KitchenWert  -Td 09/2004 -Pneumovax (1998), 05/02/2008  -Mammogram (9/08)-nml , >06/04/2012 >> - Colonsocopy nl 05/2008............................................................................Marland KitchenLina Sar  -GYN......................................................................................................... Marcelle Overlie   -CPX  04/23/2012  -refused flu shot August 18, 2009 and May 20, 2010 , 06/04/2012  COMPLEX MED REGIMEN  -- med calendar completed/adjusted reviewed November 18, 2009 but not following > using it May 20, 2010 , 07/27/2011 , 06/04/2012                  Objective:   Physical Exam  wt   155 Jan 25, 2010 > Wt 153 04/20/2011 >  10/24/2011 152 > 01/23/2012 151> 04/23/2012  149>152 06/04/2012 >  147 01/10/2013  amb bf nad with nl vital signs HEENT: edentulous with dentures in place, nl turbinates, and orophanx. Nl external ear canals without cough reflex  NECK : without JVD/Nodes/TM/ nl carotid upstrokes bilaterally  LUNGS: no acc muscle use, clear to A and P bilaterally without cough on insp or exp maneuvers  CV: RRR no s3 or murmur or increase in P2, no edema .  Pulses sym bilaterally in feet ABD: soft and nontender with nl excursion in the supine position. No bruits or organomegaly, bowel sounds nl  MS: warm without deformities, calf tenderness, cyanosis or clubbing Neuro alert, no motor, cerebellar deficits, nl gait.  Skin: no lesions    CXR  01/10/2013 :  Chronic bronchitic changes.         Assessment & Plan:

## 2013-01-10 NOTE — Patient Instructions (Addendum)
Please remember to go to the lab and x-ray department downstairs for your tests - we will call you with the results when they are available.     See Tammy NP w/in 2 weeks with all your medications, even over the counter meds, separated in two separate bags, the ones you take no matter what vs the ones you stop once you feel better and take only as needed when you feel you need them.   Tammy  will generate for you a new user friendly medication calendar that will put [place] all on the same page re: your medication use.     Without this process, it simply isn't possible to assure that we are providing  your outpatient care  with  the attention to detail we feel you deserve.   If we cannot assure that you're getting that kind of care,  then we cannot manage your problem effectively from this clinic.  Once you have seen Tammy and we are sure that we're all on the same page with your medication use she will arrange follow up with me.

## 2013-01-11 NOTE — Progress Notes (Signed)
Quick Note:  Spoke with pt and notified of results per Dr. Wert. Pt verbalized understanding and denied any questions.  ______ 

## 2013-01-13 NOTE — Assessment & Plan Note (Signed)
Adequate control on present rx, reviewed  

## 2013-01-13 NOTE — Assessment & Plan Note (Signed)
hgba1c 8.5 to 8.7 since last checked, but not clear some of her symptoms aren't related to running low sugars  For now will leave on same meds but bring her back first for medication reconciliation.   To keep things simple, I have asked the patient to first separate medicines that are perceived as maintenance, that is to be taken daily "no matter what", from those medicines that are taken on only on an as-needed basis and I have given the patient examples of both, and then return to see our NP to generate a  detailed  medication calendar which should be followed until the next physician sees the patient and updates it.

## 2013-01-13 NOTE — Assessment & Plan Note (Signed)
-   Target LDL < 70 as has dm/hbp    Lab Results  Component Value Date   CHOL 215* 01/10/2013   HDL 41.30 01/10/2013   LDLCALC 78 04/23/2012   LDLDIRECT 139.9 01/10/2013   TRIG 169.0* 01/10/2013   CHOLHDL 5 01/10/2013   Not adequately controlled on present rx - will make sure she's actually taking her meds correctly before making any adjustments.

## 2013-01-30 ENCOUNTER — Ambulatory Visit (INDEPENDENT_AMBULATORY_CARE_PROVIDER_SITE_OTHER): Payer: Medicare Other | Admitting: Adult Health

## 2013-01-30 ENCOUNTER — Encounter: Payer: Self-pay | Admitting: Adult Health

## 2013-01-30 VITALS — BP 140/80 | HR 93 | Temp 97.8°F | Ht 63.0 in | Wt 151.4 lb

## 2013-01-30 DIAGNOSIS — I10 Essential (primary) hypertension: Secondary | ICD-10-CM | POA: Diagnosis not present

## 2013-01-30 DIAGNOSIS — E119 Type 2 diabetes mellitus without complications: Secondary | ICD-10-CM

## 2013-01-30 DIAGNOSIS — E785 Hyperlipidemia, unspecified: Secondary | ICD-10-CM

## 2013-01-30 MED ORDER — AMLODIPINE BESYLATE 5 MG PO TABS: 5.0000 mg | ORAL_TABLET | Freq: Every day | ORAL | Status: DC

## 2013-01-30 MED ORDER — MOMETASONE FUROATE 50 MCG/ACT NA SUSP: 2.0000 | Freq: Two times a day (BID) | NASAL | Status: DC

## 2013-01-30 NOTE — Patient Instructions (Addendum)
Add Amlodipine 5mg  daily  Low salt diet  Low sweet diet  Zpack Take as directed.  Stop Mucinex D.  Avoid NSAIDS -aleve, advil, etc Avoid Decongestants -sudafed meds.  Restart Metformin 500mg  Twice daily   Saline nasal rinses As needed  Nasal congestion .  Use Nasonex 2 puffs Twice daily  Until sample is gone.  Follow med calendar closely and bring to each visit.  We will recheck Diabetes lab at next office visit , if not improved will need to start on Insulin .  Follow up [place] in 3 months with labs and As needed   Increase Simvastatin 40mg  At bedtime

## 2013-01-30 NOTE — Progress Notes (Signed)
Subjective:    Patient ID: [name], female    DOB: 1929/05/26  MRN: [id number]   Brief patient profile:  64  yobf quit smoking 1987 with obesity/ aodm with multiple chronic complaints and documented rhinitis with inconsistent compliance with nasal steroids   May 20, 2010 ov c/o waking up with HA almost every am. She states that her back/ left positional shoulder pain is the same- no better or worse, but is resolved with aleve. She c/o dry cough at night x 1-2 wks. bp too high rec double dose of diovan   July 13, 2010 HA and dizziness. She also c/o ringing in both ears x several days. really bad 11/5 better now and hears ok.. no room spinning but dizzy, has not tried meclizine. no ha, ataxia, nausea.   04/20/2011 f/u ov/Wert cc cpx/ leg cramping once or twice a week x months. No pain walking, no numbness.  Occ dizzy but not orthostatic related and goes away before she has time to try her meclizine prn rec Try quinine in the form of tonic water at bedtime as needed for cramps See calendar for specific medication instructions   07/20/2011 f/u ov/Wert cc hurting all over every night when lie down with stiffness in am indolent onset, progressively worse not much better with aleve but only takes one a day.  No cough or sob. No overt symptoms of hyper or hypoglycemia.  Recurrent L ant cp x years, worse in sitting position, assoc with constipation.  >>rx trazadone.   07/27/2011 Follow up and Med review Pt returns for follow up and med review. Last visit , trouble with sleeping, Trazadone was added. Pt says she is sleeping much better. Does complain over last 5 days , cough and congestion have started. Complains of head congestion, prod cough with small amounts of yellow mucus x1week .  No hemoptysis . Does have some throat drainage and dry cough at night. We discussed her chlor tabs for this-she has not started this yet. Using mucinex without much help.  We reviewed all her meds and  organized them into her med calendar with update, and pt education  Labs done last ov were essentially unremarkable except for A1C was unchanged at 8.4 - we discussed diet.  rec Zpack Take as directed.  Follow med calendar closely and bring to each visit.    10/24/2011 ov Wert/ f/u multiple chronic problems fasting for f/u lost the new med calendar provided but c/o chest pains went away " awhile ago" not reproducible with exertions.  HA no better, worse with anxiety not in am's and no assoc neuro cos, napping in afternoons sleeping poorly on trazadone 25. rec Please remember to go to the lab > all labs improving including hgba1c down from 8.4 to 7.6 Increase trazadone to 50 mg one at bedtime and no napping during the day until sleeping better at night See calendar for specific medication instructions      01/23/2012 f/u ov/Wert f/u hbp/ dm/hyperlipidemia/ insomnia cc sleeping better on melatonin, more active during the day and no high or low sugar symtoms, no tia or claudication or sob though she's relatively sedentary.  Following meds on calendar well, only stopped the trazadone because found it didn't sleep as well as melatonin.  Only using from prn list occ aleve. rec Follow med calendar   04/23/2012 f/u ov/Wert cc multiple active medical problems dm/ hbp / hyperlipidemia. Main complaint is variable R back pain x 3 months previously injected but don't  remember by whom, no radicular symptoms. Has not tried nsaids though they are already on list for prn. No localized numbness/ weakness/ no pain with cough.  Overall worse with walking, better supine     >>aleve, fasting labs , tspine -DJD changes   06/04/2012 Follow up and med review   We reviewed all her meds and organized them into her med calendar with update, and pt education  Appears she is taking meds correctly and consistently.  CPX last ov showed DM not as well controlled with  A1C up 8.5 from 7.6  We discussed diet and need to  increased Amaryl. No low sugars on current regimen.  B/P has been running up 140-160.  She has been taking Aleve on regular basis for joint pains.  We discussed stopping NSAIDS and using tylenol arthritis and vicodin for severe pain.  Renal fxn was worse with scr at 1.3  No cp, dyspnea or edema.  >>Stop NSAIDS , Increase Glimepiride 4mg  daily  07/02/2012 Follow up  Patient returns for a 4 week followup for hypertension and diabetes. Last visit blood pressure was elevated. She was recommended to stop all NSAIDs Blood sugars were also elevated. She was instructed to increase glimepiride 4 mg daily Blood sugars have been averaging around 100-150 fasting, she has had no hypoglycemia episodes. Blood pressures are averaging 140 systolic She denies any chest pain, orthopnea, PND, or leg swelling rec Change Diovan 320/12.5mg  daily  Avoid NSAIDS -advil, excederin -they can raise blood pressure and affect the kidneys.  Check sugars 3 days a week ,  call if <80 or >200 Check blood pressures 3 days a week -call if >160    01/10/2013 f/u ov/Wert multiple chronic issues hbp, dm, arthritis Chief Complaint  Patient presents with  . Acute Visit    Pt c/o fatigue, nausea and "feeling jittery" x 2 wks.     No relation to meals or activity, no cp or ha or sob, symptoms come and go day > nightly  Sleeping ok without nocturnal  or early am exacerbation  of respiratory  c/o's or need for noct saba. Also denies any obvious fluctuation of symptoms with weather or environmental changes or other aggravating or alleviating factors except as outlined above  >>  01/30/13 Follow up and med review  We reviewed all her meds and organized them into her med calendar with update, and pt education  Appears she is taking meds correctly, except taking 1/2 tab of statin and suppose to a whole tab.   Complains of head congestion, runny nose, wheezing qhs x2 weeks d/t allergies. Does have thick mucus at times.  No fever ,  chest pain or edema.   Labs from last ov with A1C tr up at 8.7 and LDL 140  We discussed DM diet , sugars .  Discussed if not improving will need to consider Insulin therapy at next ov +/- Endocrine referral.      Past Medical History:  HYPERTENSION (ICD-401.9)  HYPERLIPIDEMIA (ICD-272.4)  - target --LDL <70 (DM) due to DM  AODM  CHRONIC COUGH  - onset 1990's, almost daly since> resolved May 04, 2009 with ppi two times a day and chlortrimeton  - denied improved on ppi so try off effective August 19, 2009 > worse cough and constipation > restarted  CHRONIC RHINITIS (ICD-472.0)-non adherent with nasal steroids  -Ct sinus nml (08/1999, 01/2005)  Hx of URTICARIA (ICD-708.9)  LUMBAR RADICULOPATHY, RIGHT (ICD-724.4)  DEPRESSION (ICD-311)   MORBID OBESITY (ICD-278.01)  -  Target wt = 158 for BMI < 30  VIT D DEFICIENCY dx 08/28/08  - Rx with 50 k biw x 12weeks 09/01/08 > recheck 04/20/2011  Osteopenia  -BMD 04/2000 -nml (-0.4 hip)  -BMD 09/15/08 T spine 3.3, L Fem -1.0, R Fem - 1.0  HEALTH MAINENANCE.......................................................................................Marland KitchenWert  -Td 09/2004 -Pneumovax (1998), 05/02/2008  -Mammogram (9/08)-nml , >06/04/2012 >> - Colonsocopy nl 05/2008............................................................................Marland KitchenLina Sar  -GYN......................................................................................................... Marcelle Overlie   -CPX  04/23/2012  -refused flu shot August 18, 2009 and May 20, 2010 , 06/04/2012  COMPLEX MED REGIMEN  -- med calendar completed/adjusted reviewed November 18, 2009 but not following > using it May 20, 2010 , 07/27/2011 , 06/04/2012                 Objective:   Physical Exam  wt   155 Jan 25, 2010 > Wt 153 04/20/2011 >  10/24/2011 152 > 01/23/2012 151> 04/23/2012  149>152 06/04/2012 >  147 01/10/2013 >151 02/01/2013  amb bf nad with nl vital signs HEENT: edentulous with  dentures in place, nl turbinates, and orophanx. Nl external ear canals without cough reflex  NECK : without JVD/Nodes/TM/ nl carotid upstrokes bilaterally  LUNGS: no acc muscle use, clear to A and P bilaterally without cough on insp or exp maneuvers  CV: RRR no s3 or murmur or increase in P2, no edema .  Pulses sym bilaterally in feet ABD: soft and nontender with nl excursion in the supine position. No bruits or organomegaly, bowel sounds nl  MS: warm without deformities, calf tenderness, cyanosis or clubbing Neuro alert, no motor, cerebellar deficits, nl gait.  Skin: no lesions    CXR  01/10/2013 :  Chronic bronchitic changes.         Assessment & Plan:

## 2013-02-01 ENCOUNTER — Telehealth: Payer: Self-pay | Admitting: Internal Medicine

## 2013-02-01 MED ORDER — AZITHROMYCIN 250 MG PO TABS: ORAL_TABLET | ORAL | Status: AC

## 2013-02-01 NOTE — Telephone Encounter (Signed)
Rx has been sent in. Pt is aware. Nothing further was needed. 

## 2013-02-01 NOTE — Assessment & Plan Note (Signed)
Not at goal   Plan  Increase Simvastatin 40mg  At bedtime

## 2013-02-01 NOTE — Assessment & Plan Note (Signed)
Not at goal .   Plan  Low salt diet  Low sweet diet  Restart Metformin 500mg  Twice daily   We will recheck Diabetes lab at next office visit , if not improved will need to start on Insulin .

## 2013-02-01 NOTE — Assessment & Plan Note (Signed)
Not at goal  Patient's medications were reviewed today and patient education was given. Computerized medication calendar was adjusted/completed   Plan  Add  Amlodipine 5mg  daily  Low salt diet  Low sweet diet  Stop Mucinex D.  Avoid NSAIDS -aleve, advil, etc Avoid Decongestants -sudafed meds.  Follow med calendar closely and bring to each visit.  Follow up [place] in 3 months with labs and As needed

## 2013-02-01 NOTE — Addendum Note (Signed)
Addended by: Julio Sicks on: 02/01/2013 03:03 PM   Modules accepted: Level of Service

## 2013-03-02 ENCOUNTER — Other Ambulatory Visit: Payer: Self-pay | Admitting: Internal Medicine

## 2013-03-11 ENCOUNTER — Telehealth: Payer: Self-pay | Admitting: Internal Medicine

## 2013-03-11 MED ORDER — AZITHROMYCIN 250 MG PO TABS: ORAL_TABLET | ORAL | Status: DC

## 2013-03-11 NOTE — Telephone Encounter (Signed)
Pt c/o nasal congestion and sinus drainage (yellow) for past several weeks.  Has some headaches.  Denies fever or facial pain.  Has taken Tylenol sinus with little relief.  Please advise.  No appts available. Allergies  Allergen Reactions  . Albuterol   . Doxycycline Nausea And Vomiting

## 2013-03-11 NOTE — Telephone Encounter (Signed)
zpak and ov w/in one week

## 2013-03-11 NOTE — Telephone Encounter (Signed)
Spoke with pt and advised of Dr Thurston Hole recommendations.  Appt made and rx sent to pharmacy.

## 2013-03-19 ENCOUNTER — Encounter: Payer: Self-pay | Admitting: Internal Medicine

## 2013-03-19 ENCOUNTER — Ambulatory Visit (INDEPENDENT_AMBULATORY_CARE_PROVIDER_SITE_OTHER): Payer: Medicare Other | Admitting: Internal Medicine

## 2013-03-19 ENCOUNTER — Ambulatory Visit (INDEPENDENT_AMBULATORY_CARE_PROVIDER_SITE_OTHER)
Admission: RE | Admit: 2013-03-19 | Discharge: 2013-03-19 | Disposition: A | Discharge status: Home / Self Care | Payer: Medicare Other | Source: Ambulatory Visit | Attending: Internal Medicine | Admitting: Internal Medicine

## 2013-03-19 VITALS — BP 138/78 | HR 87 | Temp 98.1°F | Ht 63.0 in | Wt 148.0 lb

## 2013-03-19 DIAGNOSIS — R059 Cough, unspecified: Secondary | ICD-10-CM | POA: Insufficient documentation

## 2013-03-19 DIAGNOSIS — F329 Major depressive disorder, single episode, unspecified: Secondary | ICD-10-CM

## 2013-03-19 DIAGNOSIS — E785 Hyperlipidemia, unspecified: Secondary | ICD-10-CM | POA: Diagnosis not present

## 2013-03-19 DIAGNOSIS — R05 Cough: Secondary | ICD-10-CM

## 2013-03-19 DIAGNOSIS — J42 Unspecified chronic bronchitis: Secondary | ICD-10-CM | POA: Diagnosis not present

## 2013-03-19 MED ORDER — AMOXICILLIN-POT CLAVULANATE 875-125 MG PO TABS: 1.0000 | ORAL_TABLET | Freq: Two times a day (BID) | ORAL | Status: DC

## 2013-03-19 MED ORDER — TRAMADOL HCL 50 MG PO TABS: ORAL_TABLET | ORAL | Status: DC

## 2013-03-19 MED ORDER — FAMOTIDINE 20 MG PO TABS: ORAL_TABLET | ORAL | Status: DC

## 2013-03-19 MED ORDER — OMEPRAZOLE 20 MG PO CPDR: 20.0000 mg | DELAYED_RELEASE_CAPSULE | Freq: Every day | ORAL | Status: DC | PRN

## 2013-03-19 NOTE — Patient Instructions (Addendum)
Please remember to go to the lab and x-ray department downstairs for your tests - we will call you with the results when they are available.  Augmentin 875 twice daily   Stop simvastatin  Try prilosec 20mg   Take 30-60 min before first meal of the day and Pepcid 20 mg one bedtime  For moderate pain or cough > tramadol 50 mg one every 4 hours as needed    See Tammy NP w/in 2 weeks with all your medications, even over the counter meds, separated in two separate bags, the ones you take no matter what vs the ones you stop once you feel better and take only as needed when you feel you need them.   Tammy  will generate for you a new user friendly medication calendar that will put [place] all on the same page re: your medication use.    Add next step ? Sinus ct, be sure she's taking her zoloft and if better rechallenge with zocor

## 2013-03-19 NOTE — Progress Notes (Signed)
Subjective:    Patient ID: [name], female    DOB: 01-19-1929  MRN: [id number]   Brief patient profile:  42  yobf quit smoking 1987 with obesity/ aodm with multiple chronic complaints and documented rhinitis with inconsistent compliance with nasal steroids   May 20, 2010 ov c/o waking up with HA almost every am. She states that her back/ left positional shoulder pain is the same- no better or worse, but is resolved with aleve. She c/o dry cough at night x 1-2 wks. bp too high rec double dose of diovan   July 13, 2010 HA and dizziness. She also c/o ringing in both ears x several days. really bad 11/5 better now and hears ok.. no room spinning but dizzy, has not tried meclizine. no ha, ataxia, nausea.   04/20/2011 f/u ov/[name] cc cpx/ leg cramping once or twice a week x months. No pain walking, no numbness.  Occ dizzy but not orthostatic related and goes away before she has time to try her meclizine prn rec Try quinine in the form of tonic water at bedtime as needed for cramps See calendar for specific medication instructions   07/20/2011 f/u ov/[name] cc hurting all over every night when lie down with stiffness in am indolent onset, progressively worse not much better with aleve but only takes one a day.  No cough or sob. No overt symptoms of hyper or hypoglycemia.  Recurrent L ant cp x years, worse in sitting position, assoc with constipation.  >>rx trazadone.   07/27/2011 Follow up and Med review Pt returns for follow up and med review. Last visit , trouble with sleeping, Trazadone was added. Pt says she is sleeping much better. Does complain over last 5 days , cough and congestion have started. Complains of head congestion, prod cough with small amounts of yellow mucus x1week .  No hemoptysis . Does have some throat drainage and dry cough at night. We discussed her chlor tabs for this-she has not started this yet. Using mucinex without much help.  We reviewed all her meds and  organized them into her med calendar with update, and pt education  Labs done last ov were essentially unremarkable except for A1C was unchanged at 8.4 - we discussed diet.  rec Zpack Take as directed.  Follow med calendar closely and bring to each visit.    10/24/2011 ov [name]/ f/u multiple chronic problems fasting for f/u lost the new med calendar provided but c/o chest pains went away " awhile ago" not reproducible with exertions.  HA no better, worse with anxiety not in am's and no assoc neuro cos, napping in afternoons sleeping poorly on trazadone 25. rec Please remember to go to the lab > all labs improving including hgba1c down from 8.4 to 7.6 Increase trazadone to 50 mg one at bedtime and no napping during the day until sleeping better at night See calendar for specific medication instructions      01/23/2012 f/u ov/[name] f/u hbp/ dm/hyperlipidemia/ insomnia cc sleeping better on melatonin, more active during the day and no high or low sugar symtoms, no tia or claudication or sob though she's relatively sedentary.  Following meds on calendar well, only stopped the trazadone because found it didn't sleep as well as melatonin.  Only using from prn list occ aleve. rec Follow med calendar   04/23/2012 f/u ov/[name] cc multiple active medical problems dm/ hbp / hyperlipidemia. Main complaint is variable R back pain x 3 months previously injected but don't  remember by whom, no radicular symptoms. Has not tried nsaids though they are already on list for prn. No localized numbness/ weakness/ no pain with cough.  Overall worse with walking, better supine     >>aleve, fasting labs , tspine -DJD changes   06/04/2012 Follow up and med review   We reviewed all her meds and organized them into her med calendar with update, and pt education  Appears she is taking meds correctly and consistently.  CPX last ov showed DM not as well controlled with  A1C up 8.5 from 7.6  We discussed diet and need to  increased Amaryl. No low sugars on current regimen.  B/P has been running up 140-160.  She has been taking Aleve on regular basis for joint pains.  We discussed stopping NSAIDS and using tylenol arthritis and vicodin for severe pain.  Renal fxn was worse with scr at 1.3  No cp, dyspnea or edema.  >>Stop NSAIDS , Increase Glimepiride 4mg  daily  07/02/2012 Follow up  Patient returns for a 4 week followup for hypertension and diabetes. Last visit blood pressure was elevated. She was recommended to stop all NSAIDs Blood sugars were also elevated. She was instructed to increase glimepiride 4 mg daily Blood sugars have been averaging around 100-150 fasting, she has had no hypoglycemia episodes. Blood pressures are averaging 140 systolic She denies any chest pain, orthopnea, PND, or leg swelling rec Change Diovan 320/12.5mg  daily  Avoid NSAIDS -advil, excederin -they can raise blood pressure and affect the kidneys.  Check sugars 3 days a week ,  call if <80 or >200 Check blood pressures 3 days a week -call if >160    01/10/2013 f/u ov/[name] multiple chronic issues hbp, dm, arthritis Chief Complaint  Patient presents with  . Acute Visit    Pt c/o fatigue, nausea and "feeling jittery" x 2 wks.   No relation to meals or activity, no cp or ha or sob, symptoms come and go day > nightly >> no change rx  01/30/13 Follow up and med review  We reviewed all her meds and organized them into her med calendar with update, and pt education  Appears she is taking meds correctly, except taking 1/2 tab of statin and suppose to a whole tab.  Complains of head congestion, runny nose, wheezing qhs x2 weeks d/t allergies. Does have thick mucus at times.  No fever , chest pain or edema.  Labs from last ov with A1C tr up at 8.7 and LDL 140  We discussed DM diet , sugars .  Discussed if not improving will need to consider Insulin therapy at next ov +/- Endocrine referral.  rec Add Amlodipine 5mg  daily  Low salt  diet  Low sweet diet  Zpack Take as directed.  Stop Mucinex D.  Avoid NSAIDS -aleve, advil, etc Avoid Decongestants -sudafed meds.  Restart Metformin 500mg  Twice daily   Saline nasal rinses As needed  Nasal congestion .  Use Nasonex 2 puffs Twice daily  Until sample is gone.  Follow med calendar closely and bring to each visit.  We will recheck Diabetes lab at next office visit , if not improved will need to start on Insulin .  Follow up [place] in 3 months with labs and As needed   Increase Simvastatin 40mg  At bedtime  03/19/2013 f/u ov/[name] re multiple complaints Chief Complaint  Patient presents with  . Follow-up    Pt c/o runny nose, nasal congestion and dry cough for the past several  wks. She also stiffness in her neck for the past wk.   nasal congestion bilaterally difficulty breathing through nose since mid May 2014,  Some yellow mucus, difficulty breathing nose is what's keeping her up, assoc with hacking cough and generalized chest and trunk discomfort worse with coughing.  No sinus pain, nasal discharge is beige to watery. Has not taken meds on action plan part of calendar for symptoms addressed on the list from previous visits  No obvious daytime variabilty or assoc sob or   chest tightness, subjective wheeze overt  hb symptoms. No unusual exp hx or h/o childhood pna/ asthma or knowledge of premature birth.     Also denies any obvious fluctuation of symptoms with weather or environmental changes or other aggravating or alleviating factors except as outlined above   Current Medications, Allergies, Past Medical History, Past Surgical History, Family History, and Social History were reviewed in [employer] record.  ROS  The following are not active complaints unless bolded sore throat, dysphagia, dental problems, itching, sneezing,  nasal congestion or excess/ purulent secretions, ear ache,   fever, chills, sweats, unintended wt loss, pleuritic or exertional  cp, hemoptysis,  orthopnea pnd or leg swelling, presyncope, palpitations, heartburn, abdominal pain, anorexia, nausea, vomiting, diarrhea  or change in bowel or urinary habits, change in stools or urine, dysuria,hematuria,  rash, arthralgias, visual complaints, headache, numbness weakness or ataxia or problems with walking or coordination,  change in mood/affect or memory.          Past Medical History:  HYPERTENSION (ICD-401.9)  HYPERLIPIDEMIA (ICD-272.4)  - target --LDL <70 (DM) due to DM  AODM  CHRONIC COUGH  - onset 1990's, almost daly since> resolved May 04, 2009 with ppi two times a day and chlortrimeton  - denied improved on ppi so try off effective August 19, 2009 > worse cough and constipation > restarted  CHRONIC RHINITIS (ICD-472.0)-non adherent with nasal steroids  -Ct sinus nml (08/1999, 01/2005)  Hx of URTICARIA (ICD-708.9)  LUMBAR RADICULOPATHY, RIGHT (ICD-724.4)  DEPRESSION (ICD-311)   MORBID OBESITY (ICD-278.01)  - Target wt = 158 for BMI < 30  VIT D DEFICIENCY dx 08/28/08  - Rx with 50 k biw x 12weeks 09/01/08 > recheck 04/20/2011  Osteopenia  -BMD 04/2000 -nml (-0.4 hip)  -BMD 09/15/08 T spine 3.3, L Fem -1.0, R Fem - 1.0  HEALTH MAINENANCE.......................................................................................Marland KitchenWert  -Td 09/2004 -Pneumovax (1998), 05/02/2008  -Mammogram (9/08)-nml , >06/04/2012 >> - Colonsocopy nl 05/2008............................................................................Marland KitchenLina Sar  -GYN......................................................................................................... Marcelle Overlie   -CPX  04/23/2012  -refused flu shot August 18, 2009 and May 20, 2010 , 06/04/2012  COMPLEX MED REGIMEN  -- med calendar completed/adjusted reviewed November 18, 2009 but not following > using it May 20, 2010 , 07/27/2011 , 06/04/2012                 Objective:   Physical Exam  wt   155 Jan 25, 2010 > Wt  153 04/20/2011 >  10/24/2011 152 > 01/23/2012 151> 04/23/2012  149>152 06/04/2012 >  147 01/10/2013 >151 02/01/2013 >  148 amb bf nad with nl vital signs, overtly depressed with evasive answers to direct questions  HEENT: edentulous with dentures in place, nl turbinates, and orophanx. Nl external ear canals without cough reflex  NECK : without JVD/Nodes/TM/ nl carotid upstrokes bilaterally  LUNGS: no acc muscle use, clear to A and P bilaterally without cough on insp or exp maneuvers  CV: RRR no s3 or murmur or increase in P2, no  edema .  Pulses sym bilaterally in feet ABD: soft and nontender with nl excursion in the supine position. No bruits or organomegaly, bowel sounds nl  MS: warm without deformities, calf tenderness, cyanosis or clubbing. Neck full rom Neuro alert, no motor, cerebellar deficits, nl gait.  Skin: no lesions    CXR  03/19/2013 :   . No acute cardiopulmonary findings. 2. Chronic bronchitic markings          Assessment & Plan:

## 2013-03-20 ENCOUNTER — Other Ambulatory Visit: Payer: Self-pay | Admitting: Adult Health

## 2013-03-21 MED ORDER — HYDROCODONE-ACETAMINOPHEN 5-325 MG PO TABS: 1.0000 | ORAL_TABLET | Freq: Four times a day (QID) | ORAL | Status: DC | PRN

## 2013-03-21 NOTE — Telephone Encounter (Signed)
Vicodin 5-500mg  given by TP 05/2012 for arthralgia.  No refills since.  Last ov was 7.15.14 w/ MW.  Per TP, ok to change to Norco 5-325mg  #30, 1po q6h prn, 0 refills.  Verbal order given to pharmacist Crystal.

## 2013-03-21 NOTE — Assessment & Plan Note (Addendum)
-   Target LDL < 70 as has dm/hbp    Will hold zocor because her neck and mscp seem out of proportion to findings.

## 2013-03-21 NOTE — Assessment & Plan Note (Signed)
Classic Upper airway cough syndrome, so named because it's frequently impossible to sort out how much is  CR/sinusitis with freq throat clearing (which can be related to primary GERD)   vs  causing  secondary (" extra esophageal")  GERD from wide swings in gastric pressure that occur with throat clearing, often  promoting self use of mint and menthol lozenges that reduce the lower esophageal sphincter tone and exacerbate the problem further in a cyclical fashion.   These are the same pts (now being labeled as having "irritable larynx syndrome" by some cough centers) who not infrequently have a history of having failed to tolerate ace inhibitors,  dry powder inhalers or biphosphonates or report having atypical reflux symptoms that don't respond to standard doses of PPI , and are easily confused as having aecopd or asthma flares by even experienced allergists/ pulmonologists.   For now rx as sinusitis and gerd and next step is sinus ct. Use tramadol for cough and pain.     Each maintenance medication was reviewed in detail including most importantly the difference between maintenance and as needed and under what circumstances the prns are to be used. This was done in the context of a medication calendar review which provided the patient with a user-friendly unambiguous mechanism for medication administration and reconciliation and provides an action plan for all active problems. It is critical that this be shown to every doctor  for modification during the office visit if necessary so the patient can use it as a working document.

## 2013-03-21 NOTE — Assessment & Plan Note (Signed)
Strongly suspect this is contributing to her symptoms - check bottles on return to be sure she's taking zoloft

## 2013-03-27 NOTE — Progress Notes (Signed)
Quick Note:  Spoke with pt and notified of results per Dr. Wert. Pt verbalized understanding and denied any questions.  ______ 

## 2013-04-02 ENCOUNTER — Ambulatory Visit (INDEPENDENT_AMBULATORY_CARE_PROVIDER_SITE_OTHER): Payer: Medicare Other | Admitting: Adult Health

## 2013-04-02 ENCOUNTER — Encounter: Payer: Self-pay | Admitting: Adult Health

## 2013-04-02 VITALS — BP 154/80 | HR 77 | Temp 97.6°F | Ht 63.0 in | Wt 147.4 lb

## 2013-04-02 DIAGNOSIS — J310 Chronic rhinitis: Secondary | ICD-10-CM | POA: Diagnosis not present

## 2013-04-02 DIAGNOSIS — M2550 Pain in unspecified joint: Secondary | ICD-10-CM

## 2013-04-02 MED ORDER — PREDNISONE 10 MG PO TABS: ORAL_TABLET | ORAL | Status: DC

## 2013-04-02 MED ORDER — CEFDINIR 300 MG PO CAPS: 300.0000 mg | ORAL_CAPSULE | Freq: Two times a day (BID) | ORAL | Status: DC

## 2013-04-02 NOTE — Patient Instructions (Addendum)
Omnicef 300 mg twice daily for 10 days, take with food ,eat yogurt daily while on antibiotics. Prednisone taper. Over the next week. Alternate ice and heat to the neck. As needed. Mucinex DM twice daily as needed. For cough and congestion. Continue to do saline nasal rinses twice daily. Medication calendar closely and bring to each visit. Follow up in 2-3 weeks and as needed.

## 2013-04-02 NOTE — Assessment & Plan Note (Signed)
Neck pain, suspect musculoskeletal in nature. Patient advised to use ice and heat. May use tramadol as needed for pain .  If not improving on return consider xray vs refer to ortho

## 2013-04-02 NOTE — Progress Notes (Signed)
Subjective:    Patient ID: [name], female    DOB: 11/26/1928  MRN: [id number]   Brief patient profile:  68  yobf quit smoking 1987 with obesity/ aodm with multiple chronic complaints and documented rhinitis with inconsistent compliance with nasal steroids   May 20, 2010 ov c/o waking up with HA almost every am. She states that her back/ left positional shoulder pain is the same- no better or worse, but is resolved with aleve. She c/o dry cough at night x 1-2 wks. bp too high rec double dose of diovan   July 13, 2010 HA and dizziness. She also c/o ringing in both ears x several days. really bad 11/5 better now and hears ok.. no room spinning but dizzy, has not tried meclizine. no ha, ataxia, nausea.   04/20/2011 f/u ov/Wert cc cpx/ leg cramping once or twice a week x months. No pain walking, no numbness.  Occ dizzy but not orthostatic related and goes away before she has time to try her meclizine prn rec Try quinine in the form of tonic water at bedtime as needed for cramps See calendar for specific medication instructions   07/20/2011 f/u ov/Wert cc hurting all over every night when lie down with stiffness in am indolent onset, progressively worse not much better with aleve but only takes one a day.  No cough or sob. No overt symptoms of hyper or hypoglycemia.  Recurrent L ant cp x years, worse in sitting position, assoc with constipation.  >>rx trazadone.   07/27/2011 Follow up and Med review Pt returns for follow up and med review. Last visit , trouble with sleeping, Trazadone was added. Pt says she is sleeping much better. Does complain over last 5 days , cough and congestion have started. Complains of head congestion, prod cough with small amounts of yellow mucus x1week .  No hemoptysis . Does have some throat drainage and dry cough at night. We discussed her chlor tabs for this-she has not started this yet. Using mucinex without much help.  We reviewed all her meds and  organized them into her med calendar with update, and pt education  Labs done last ov were essentially unremarkable except for A1C was unchanged at 8.4 - we discussed diet.  rec Zpack Take as directed.  Follow med calendar closely and bring to each visit.    10/24/2011 ov Wert/ f/u multiple chronic problems fasting for f/u lost the new med calendar provided but c/o chest pains went away " awhile ago" not reproducible with exertions.  HA no better, worse with anxiety not in am's and no assoc neuro cos, napping in afternoons sleeping poorly on trazadone 25. rec Please remember to go to the lab > all labs improving including hgba1c down from 8.4 to 7.6 Increase trazadone to 50 mg one at bedtime and no napping during the day until sleeping better at night See calendar for specific medication instructions      01/23/2012 f/u ov/Wert f/u hbp/ dm/hyperlipidemia/ insomnia cc sleeping better on melatonin, more active during the day and no high or low sugar symtoms, no tia or claudication or sob though she's relatively sedentary.  Following meds on calendar well, only stopped the trazadone because found it didn't sleep as well as melatonin.  Only using from prn list occ aleve. rec Follow med calendar   04/23/2012 f/u ov/Wert cc multiple active medical problems dm/ hbp / hyperlipidemia. Main complaint is variable R back pain x 3 months previously injected but don't  remember by whom, no radicular symptoms. Has not tried nsaids though they are already on list for prn. No localized numbness/ weakness/ no pain with cough.  Overall worse with walking, better supine     >>aleve, fasting labs , tspine -DJD changes   06/04/2012 Follow up and med review   We reviewed all her meds and organized them into her med calendar with update, and pt education  Appears she is taking meds correctly and consistently.  CPX last ov showed DM not as well controlled with  A1C up 8.5 from 7.6  We discussed diet and need to  increased Amaryl. No low sugars on current regimen.  B/P has been running up 140-160.  She has been taking Aleve on regular basis for joint pains.  We discussed stopping NSAIDS and using tylenol arthritis and vicodin for severe pain.  Renal fxn was worse with scr at 1.3  No cp, dyspnea or edema.  >>Stop NSAIDS , Increase Glimepiride 4mg  daily  07/02/2012 Follow up  Patient returns for a 4 week followup for hypertension and diabetes. Last visit blood pressure was elevated. She was recommended to stop all NSAIDs Blood sugars were also elevated. She was instructed to increase glimepiride 4 mg daily Blood sugars have been averaging around 100-150 fasting, she has had no hypoglycemia episodes. Blood pressures are averaging 140 systolic She denies any chest pain, orthopnea, PND, or leg swelling rec Change Diovan 320/12.5mg  daily  Avoid NSAIDS -advil, excederin -they can raise blood pressure and affect the kidneys.  Check sugars 3 days a week ,  call if <80 or >200 Check blood pressures 3 days a week -call if >160    01/10/2013 f/u ov/Wert multiple chronic issues hbp, dm, arthritis Chief Complaint  Patient presents with  . Acute Visit    Pt c/o fatigue, nausea and "feeling jittery" x 2 wks.   No relation to meals or activity, no cp or ha or sob, symptoms come and go day > nightly >> no change rx  01/30/13 Follow up and med review  W except taking 1/2 tab of statin and suppose to a whole tabe reviewed all her meds and organized them into her med calendar with update, and pt education  Appears she is taking meds correctly,.  Complains of head congestion, runny nose, wheezing qhs x2 weeks d/t allergies. Does have thick mucus at times.  No fever , chest pain or edema.  Labs from last ov with A1C tr up at 8.7 and LDL 140  We discussed DM diet , sugars .  Discussed if not improving will need to consider Insulin therapy at next ov +/- Endocrine referral.  rec Add Amlodipine 5mg  daily  Low salt  diet  Low sweet diet  Zpack Take as directed.  Stop Mucinex D.  Avoid NSAIDS -aleve, advil, etc Avoid Decongestants -sudafed meds.  Restart Metformin 500mg  Twice daily   Saline nasal rinses As needed  Nasal congestion .  Use Nasonex 2 puffs Twice daily  Until sample is gone.  Follow med calendar closely and bring to each visit.  We will recheck Diabetes lab at next office visit , if not improved will need to start on Insulin .  Follow up [facility] in 3 months with labs and As needed   Increase Simvastatin 40mg  At bedtime  03/19/2013 f/u ov/Wert re multiple complaints Chief Complaint  Patient presents with  . Follow-up    Pt c/o runny nose, nasal congestion and dry cough for the past several  wks. She also stiffness in her neck for the past wk.   nasal congestion bilaterally difficulty breathing through nose since mid May 2014,  Some yellow mucus, difficulty breathing nose is what's keeping her up, assoc with hacking cough and generalized chest and trunk discomfort worse with coughing.  No sinus pain, nasal discharge is beige to watery. Has not taken meds on action plan part of calendar for symptoms addressed on the list from previous visits >>augmentin rx    04/02/2013 Follow up and med review  We reviewed all her meds and organized them into her med calendar with update, and pt education  Appears she is taking meds correctly, Patient was seen 2 weeks ago and felt to have a sinus infection. She was given Augmentin but unfortunately was only able to take it for 4 days. Due to severe diarrhea. Patient reports her sinus condition did improve however, never totally went away, and is starting to increase with sinus pain pressure.  Continues to complain of some intermittent neck pain. Pain medicines, to help. Patient was simvastatin was stopped for possible relation to, muscle pain. However, she did not see any change off of her simvastatin for the last 2 week Chest x-ray done last visit was  normal Patient denies any hemoptysis, chest pain, orthopnea, PND, fever, extremity weakness, visual or speech changes, or difficulty swallowing.   Current Medications, Allergies, Past Medical History, Past Surgical History, Family History, and Social History were reviewed in [employer] record.  ROS  The following are not active complaints unless bolded sore throat, dysphagia, dental problems, itching, sneezing,  nasal congestion or excess/ purulent secretions, ear ache,   fever, chills, sweats, unintended wt loss, pleuritic or exertional cp, hemoptysis,  orthopnea pnd or leg swelling, presyncope, palpitations, heartburn, abdominal pain, anorexia, nausea, vomiting, diarrhea  or change in bowel or urinary habits, change in stools or urine, dysuria,hematuria,  rash, arthralgias, visual complaints, headache, numbness weakness or ataxia or problems with walking or coordination,  affect or memory.          Past Medical History:  HYPERTENSION (ICD-401.9)  HYPERLIPIDEMIA (ICD-272.4)  - target --LDL <70 (DM) due to DM  AODM  CHRONIC COUGH  - onset 1990's, almost daly since> resolved May 04, 2009 with ppi two times a day and chlortrimeton  - denied improved on ppi so try off effective August 19, 2009 > worse cough and constipation > restarted  CHRONIC RHINITIS (ICD-472.0)-non adherent with nasal steroids  -Ct sinus nml (08/1999, 01/2005)  Hx of URTICARIA (ICD-708.9)  LUMBAR RADICULOPATHY, RIGHT (ICD-724.4)  DEPRESSION (ICD-311)   MORBID OBESITY (ICD-278.01)  - Target wt = 158 for BMI < 30  VIT D DEFICIENCY dx 08/28/08  - Rx with 50 k biw x 12weeks 09/01/08 > recheck 04/20/2011  Osteopenia  -BMD 04/2000 -nml (-0.4 hip)  -BMD 09/15/08 T spine 3.3, L Fem -1.0, R Fem - 1.0  HEALTH MAINENANCE.......................................................................................Marland KitchenWert  -Td 09/2004 -Pneumovax (1998), 05/02/2008  -Mammogram (9/08)-nml , >06/04/2012 >> -  Colonsocopy nl 05/2008............................................................................Marland KitchenLina Sar  -GYN......................................................................................................... Marcelle Overlie   -CPX  04/23/2012  -refused flu shot August 18, 2009 and May 20, 2010 , 06/04/2012  COMPLEX MED REGIMEN  -- med calendar completed/adjusted reviewed November 18, 2009 but not following > using it May 20, 2010 , 07/27/2011 , 06/04/2012 .04/02/2013                 Objective:   Physical Exam  wt   155 Jan 25, 2010 > Wt 153  04/20/2011 >  10/24/2011 152 > 01/23/2012 151> 04/23/2012  149>152 06/04/2012 >  147 01/10/2013 >151 02/01/2013 >  148> 147 04/02/2013  amb bf nad with nl vital signs  HEENT: edentulous with dentures in place, nl turbinates, and orophanx. Nl external ear canals without cough reflex  Max sinus tendeness  NECK : without JVD/Nodes/TM/ nl carotid upstrokes bilaterally  LUNGS:  no acc muscle use, clear to A and P bilaterally without cough on insp or exp maneuvers  CV: RRR no s3 or murmur or increase in P2, no edema .  Pulses sym bilaterally in feet ABD: soft and nontender with nl excursion in the supine position. No bruits or organomegaly, bowel sounds nl  MS: warm without deformities, calf tenderness, cyanosis or clubbing. Neck full rom, nml grips and equal strength,  Tender along right post neck Neuro alert, no motor, cerebellar deficits, nl gait.  Skin: no lesions    CXR  03/19/2013 :   . No acute cardiopulmonary findings. 2. Chronic bronchitic markings          Assessment & Plan:

## 2013-04-02 NOTE — Assessment & Plan Note (Addendum)
?  sinusitis -slow to resolve  If not improved on return consider CT sinus   Patient's medications were reviewed today and patient education was given. Computerized medication calendar was adjusted/completed  Plan  Omnicef 300 mg twice daily for 10 days, take with food ,eat yogurt daily while on antibiotics. Prednisone taper. Over the next week.   Mucinex DM twice daily as needed. For cough and congestion. Continue to do saline nasal rinses twice daily. Medication calendar closely and bring to each visit. Follow up in 2-3 weeks and as needed.

## 2013-04-05 NOTE — Addendum Note (Signed)
Addended by: Darrell Jewel on: 04/05/2013 05:31 PM   Modules accepted: Orders

## 2013-04-09 ENCOUNTER — Other Ambulatory Visit: Payer: Self-pay | Admitting: Internal Medicine

## 2013-04-19 ENCOUNTER — Encounter: Payer: Self-pay | Admitting: Adult Health

## 2013-04-19 ENCOUNTER — Ambulatory Visit (INDEPENDENT_AMBULATORY_CARE_PROVIDER_SITE_OTHER): Payer: Medicare Other | Admitting: Adult Health

## 2013-04-19 VITALS — BP 132/76 | HR 76 | Temp 98.2°F | Ht 63.0 in | Wt 145.2 lb

## 2013-04-19 DIAGNOSIS — B373 Candidiasis of vulva and vagina: Secondary | ICD-10-CM | POA: Insufficient documentation

## 2013-04-19 DIAGNOSIS — J310 Chronic rhinitis: Secondary | ICD-10-CM

## 2013-04-19 MED ORDER — FLUCONAZOLE 150 MG PO TABS: 150.0000 mg | ORAL_TABLET | Freq: Once | ORAL | Status: DC

## 2013-04-19 NOTE — Assessment & Plan Note (Signed)
Recent flare with sinusitis now resovled

## 2013-04-19 NOTE — Progress Notes (Signed)
Subjective:    Patient ID: [name], female    DOB: 20-Feb-1929  MRN: [id number]   Brief patient profile:  11  yobf quit smoking 1987 with obesity/ aodm with multiple chronic complaints and documented rhinitis with inconsistent compliance with nasal steroids   May 20, 2010 ov c/o waking up with HA almost every am. She states that her back/ left positional shoulder pain is the same- no better or worse, but is resolved with aleve. She c/o dry cough at night x 1-2 wks. bp too high rec double dose of diovan   July 13, 2010 HA and dizziness. She also c/o ringing in both ears x several days. really bad 11/5 better now and hears ok.. no room spinning but dizzy, has not tried meclizine. no ha, ataxia, nausea.   04/20/2011 f/u ov/Wert cc cpx/ leg cramping once or twice a week x months. No pain walking, no numbness.  Occ dizzy but not orthostatic related and goes away before she has time to try her meclizine prn rec Try quinine in the form of tonic water at bedtime as needed for cramps See calendar for specific medication instructions   07/20/2011 f/u ov/Wert cc hurting all over every night when lie down with stiffness in am indolent onset, progressively worse not much better with aleve but only takes one a day.  No cough or sob. No overt symptoms of hyper or hypoglycemia.  Recurrent L ant cp x years, worse in sitting position, assoc with constipation.  >>rx trazadone.   07/27/2011 Follow up and Med review Pt returns for follow up and med review. Last visit , trouble with sleeping, Trazadone was added. Pt says she is sleeping much better. Does complain over last 5 days , cough and congestion have started. Complains of head congestion, prod cough with small amounts of yellow mucus x1week .  No hemoptysis . Does have some throat drainage and dry cough at night. We discussed her chlor tabs for this-she has not started this yet. Using mucinex without much help.  We reviewed all her meds and  organized them into her med calendar with update, and pt education  Labs done last ov were essentially unremarkable except for A1C was unchanged at 8.4 - we discussed diet.  rec Zpack Take as directed.  Follow med calendar closely and bring to each visit.    10/24/2011 ov Wert/ f/u multiple chronic problems fasting for f/u lost the new med calendar provided but c/o chest pains went away " awhile ago" not reproducible with exertions.  HA no better, worse with anxiety not in am's and no assoc neuro cos, napping in afternoons sleeping poorly on trazadone 25. rec Please remember to go to the lab > all labs improving including hgba1c down from 8.4 to 7.6 Increase trazadone to 50 mg one at bedtime and no napping during the day until sleeping better at night See calendar for specific medication instructions      01/23/2012 f/u ov/Wert f/u hbp/ dm/hyperlipidemia/ insomnia cc sleeping better on melatonin, more active during the day and no high or low sugar symtoms, no tia or claudication or sob though she's relatively sedentary.  Following meds on calendar well, only stopped the trazadone because found it didn't sleep as well as melatonin.  Only using from prn list occ aleve. rec Follow med calendar   04/23/2012 f/u ov/Wert cc multiple active medical problems dm/ hbp / hyperlipidemia. Main complaint is variable R back pain x 3 months previously injected but don't  remember by whom, no radicular symptoms. Has not tried nsaids though they are already on list for prn. No localized numbness/ weakness/ no pain with cough.  Overall worse with walking, better supine     >>aleve, fasting labs , tspine -DJD changes   06/04/2012 Follow up and med review   We reviewed all her meds and organized them into her med calendar with update, and pt education  Appears she is taking meds correctly and consistently.  CPX last ov showed DM not as well controlled with  A1C up 8.5 from 7.6  We discussed diet and need to  increased Amaryl. No low sugars on current regimen.  B/P has been running up 140-160.  She has been taking Aleve on regular basis for joint pains.  We discussed stopping NSAIDS and using tylenol arthritis and vicodin for severe pain.  Renal fxn was worse with scr at 1.3  No cp, dyspnea or edema.  >>Stop NSAIDS , Increase Glimepiride 4mg  daily  07/02/2012 Follow up  Patient returns for a 4 week followup for hypertension and diabetes. Last visit blood pressure was elevated. She was recommended to stop all NSAIDs Blood sugars were also elevated. She was instructed to increase glimepiride 4 mg daily Blood sugars have been averaging around 100-150 fasting, she has had no hypoglycemia episodes. Blood pressures are averaging 140 systolic She denies any chest pain, orthopnea, PND, or leg swelling rec Change Diovan 320/12.5mg  daily  Avoid NSAIDS -advil, excederin -they can raise blood pressure and affect the kidneys.  Check sugars 3 days a week ,  call if <80 or >200 Check blood pressures 3 days a week -call if >160    01/10/2013 f/u ov/Wert multiple chronic issues hbp, dm, arthritis Chief Complaint  Patient presents with  . Acute Visit    Pt c/o fatigue, nausea and "feeling jittery" x 2 wks.   No relation to meals or activity, no cp or ha or sob, symptoms come and go day > nightly >> no change rx  01/30/13 Follow up and med review  W except taking 1/2 tab of statin and suppose to a whole tabe reviewed all her meds and organized them into her med calendar with update, and pt education  Appears she is taking meds correctly,.  Complains of head congestion, runny nose, wheezing qhs x2 weeks d/t allergies. Does have thick mucus at times.  No fever , chest pain or edema.  Labs from last ov with A1C tr up at 8.7 and LDL 140  We discussed DM diet , sugars .  Discussed if not improving will need to consider Insulin therapy at next ov +/- Endocrine referral.  rec Add Amlodipine 5mg  daily  Low salt  diet  Low sweet diet  Zpack Take as directed.  Stop Mucinex D.  Avoid NSAIDS -aleve, advil, etc Avoid Decongestants -sudafed meds.  Restart Metformin 500mg  Twice daily   Saline nasal rinses As needed  Nasal congestion .  Use Nasonex 2 puffs Twice daily  Until sample is gone.  Follow med calendar closely and bring to each visit.  We will recheck Diabetes lab at next office visit , if not improved will need to start on Insulin .  Follow up [facility] in 3 months with labs and As needed   Increase Simvastatin 40mg  At bedtime  03/19/2013 f/u ov/Wert re multiple complaints Chief Complaint  Patient presents with  . Follow-up    Pt c/o runny nose, nasal congestion and dry cough for the past several  wks. She also stiffness in her neck for the past wk.   nasal congestion bilaterally difficulty breathing through nose since mid May 2014,  Some yellow mucus, difficulty breathing nose is what's keeping her up, assoc with hacking cough and generalized chest and trunk discomfort worse with coughing.  No sinus pain, nasal discharge is beige to watery. Has not taken meds on action plan part of calendar for symptoms addressed on the list from previous visits >>augmentin rx    04/02/2013 Follow up and med review  We reviewed all her meds and organized them into her med calendar with update, and pt education  Appears she is taking meds correctly, Patient was seen 2 weeks ago and felt to have a sinus infection. She was given Augmentin but unfortunately was only able to take it for 4 days. Due to severe diarrhea. Patient reports her sinus condition did improve however, never totally went away, and is starting to increase with sinus pain pressure.  Continues to complain of some intermittent neck pain. Pain medicines, to help. Patient was simvastatin was stopped for possible relation to, muscle pain. However, she did not see any change off of her simvastatin for the last 2 week Chest x-ray done last visit was  normal Patient denies any hemoptysis, chest pain, orthopnea, PND, fever, extremity weakness, visual or speech changes, or difficulty swallowing. >>omnicef and pred pack  04/19/2013 Follow Up  Patient returns for a two-week followup Hasn't had a slow to resolve, sinusitis, and asthmatic bronchitis. She was treated with Omnicef for 10 days and a prednisone taper. Patient reports that she is much improved with decreased cough and congestion. Patient also is having some cervical neck pain felt to be musculoskeletal. This is also improved with prednisone. Patient denies any hemoptysis, orthopnea, PND, or leg swelling. Does complain that she developed a vaginal yeast infection. Would like medication for treatment. She denies any urinary symptoms. Back pain, vaginal bleeding.  Current Medications, Allergies, Past Medical History, Past Surgical History, Family History, and Social History were reviewed in [employer] record.  ROS  The following are not active complaints unless bolded sore throat, dysphagia, dental problems, itching, sneezing,  nasal congestion or excess/ purulent secretions, ear ache,   fever, chills, sweats, unintended wt loss, pleuritic or exertional cp, hemoptysis,  orthopnea pnd or leg swelling, presyncope, palpitations, heartburn, abdominal pain, anorexia, nausea, vomiting, diarrhea  or change in bowel or urinary habits, change in stools or urine, dysuria,hematuria,  rash, arthralgias, visual complaints, headache, numbness weakness or ataxia or problems with walking or coordination,  affect or memory.          Past Medical History:  HYPERTENSION (ICD-401.9)  HYPERLIPIDEMIA (ICD-272.4)  - target --LDL <70 (DM) due to DM  AODM  CHRONIC COUGH  - onset 1990's, almost daly since> resolved May 04, 2009 with ppi two times a day and chlortrimeton  - denied improved on ppi so try off effective August 19, 2009 > worse cough and constipation > restarted  CHRONIC  RHINITIS (ICD-472.0)-non adherent with nasal steroids  -Ct sinus nml (08/1999, 01/2005)  Hx of URTICARIA (ICD-708.9)  LUMBAR RADICULOPATHY, RIGHT (ICD-724.4)  DEPRESSION (ICD-311)   MORBID OBESITY (ICD-278.01)  - Target wt = 158 for BMI < 30  VIT D DEFICIENCY dx 08/28/08  - Rx with 50 k biw x 12weeks 09/01/08 > recheck 04/20/2011  Osteopenia  -BMD 04/2000 -nml (-0.4 hip)  -BMD 09/15/08 T spine 3.3, L Fem -1.0, R Fem - 1.0  HEALTH  MAINENANCE.......................................................................................Marland KitchenWert  -Td 09/2004 -Pneumovax (1998), 05/02/2008  -Mammogram (9/08)-nml , >06/04/2012 >> - Colonsocopy nl 05/2008............................................................................Marland KitchenLina Sar  -GYN......................................................................................................... Marcelle Overlie   -CPX  04/23/2012  -refused flu shot August 18, 2009 and May 20, 2010 , 06/04/2012  COMPLEX MED REGIMEN  -- med calendar completed/adjusted reviewed November 18, 2009 but not following > using it May 20, 2010 , 07/27/2011 , 06/04/2012 .04/02/2013       Objective:   Physical Exam  wt   155 Jan 25, 2010 > Wt 153 04/20/2011 >  10/24/2011 152 > 01/23/2012 151> 04/23/2012  149>152 06/04/2012 >  147 01/10/2013 >151 02/01/2013 >  148> 147 04/02/2013 >145 04/19/2013  amb bf nad with nl vital signs  HEENT: edentulous with dentures in place, nl turbinates, and orophanx. Nl external ear canals without cough reflex  Max sinus tendeness  NECK : without JVD/Nodes/TM/ nl carotid upstrokes bilaterally  LUNGS:  no acc muscle use, clear to A and P bilaterally without cough on insp or exp maneuvers  CV: RRR no s3 or murmur or increase in P2, no edema .  Pulses sym bilaterally in feet ABD: soft and nontender with nl excursion in the supine position. No bruits or organomegaly, bowel sounds nl  MS: warm without deformities, calf tenderness, cyanosis or clubbing. Neck full rom,  nml grips and equal strength,  Neuro alert, no motor, cerebellar deficits, nl gait.  Skin: no lesions  CXR  03/19/2013 :  No acute cardiopulmonary findings.  Chronic bronchitic markings          Assessment & Plan:

## 2013-04-19 NOTE — Patient Instructions (Addendum)
Diflucan 150mg  x 1 . Take tonight .  Hold Zocor for 1 week.  Eat yogurt.  Probiotic -Digestive Advantage gummies may help when on antibitoics-can take directed.  Follow up 2 months with Dr. Sherene Sires  For fasting labs  Please contact office for sooner follow up if symptoms do not improve or worsen or seek emergency care

## 2013-04-19 NOTE — Assessment & Plan Note (Signed)
Diflucan 150mg  x 1  If not improving refer to GYN

## 2013-04-20 ENCOUNTER — Other Ambulatory Visit: Payer: Self-pay | Admitting: Internal Medicine

## 2013-04-27 ENCOUNTER — Other Ambulatory Visit: Payer: Self-pay | Admitting: Internal Medicine

## 2013-04-29 DIAGNOSIS — Z961 Presence of intraocular lens: Secondary | ICD-10-CM | POA: Diagnosis not present

## 2013-04-29 DIAGNOSIS — H52209 Unspecified astigmatism, unspecified eye: Secondary | ICD-10-CM | POA: Diagnosis not present

## 2013-04-29 DIAGNOSIS — E119 Type 2 diabetes mellitus without complications: Secondary | ICD-10-CM | POA: Diagnosis not present

## 2013-05-02 ENCOUNTER — Ambulatory Visit: Payer: Medicare Other | Admitting: Adult Health

## 2013-05-10 ENCOUNTER — Telehealth: Payer: Self-pay | Admitting: Internal Medicine

## 2013-05-10 MED ORDER — VALSARTAN-HYDROCHLOROTHIAZIDE 160-12.5 MG PO TABS: 1.0000 | ORAL_TABLET | Freq: Two times a day (BID) | ORAL | Status: DC

## 2013-05-10 NOTE — Telephone Encounter (Signed)
I spoke with pt. She stated she has not been able to get her diovan HCT filled. She is not sure why. I called [facility]. Was advised she has not had this medication filled since January 2014. Also pt insurance no longer covers this for pt. It did not show alternative. Please advise MW if you want [place] to do PA or change medication thanks  Allergies  Allergen Reactions  . Albuterol   . Doxycycline Nausea And Vomiting

## 2013-05-10 NOTE — Telephone Encounter (Signed)
After speaking with the pt it turns out she only needs a refill on diovan. I sent this to the pharmacy. She states she has been taking meds daily. She refused an appt with TP stating she just had one a few weeks ago. Carron Curie, CMA

## 2013-05-10 NOTE — Telephone Encounter (Signed)
If not taking it since Jan shouldn't start it now (ast bp in Aug fine off it) but I'm concerned we don't have full med reconciliation and that s a big problem and risks her health so need to regroup with Tammy asap with all meds in hand and no changes rx until then.

## 2013-06-04 ENCOUNTER — Other Ambulatory Visit: Payer: Self-pay | Admitting: Adult Health

## 2013-06-19 ENCOUNTER — Ambulatory Visit (INDEPENDENT_AMBULATORY_CARE_PROVIDER_SITE_OTHER)
Admission: RE | Admit: 2013-06-19 | Discharge: 2013-06-19 | Disposition: A | Discharge status: Home / Self Care | Payer: Medicare Other | Source: Ambulatory Visit | Attending: Internal Medicine | Admitting: Internal Medicine

## 2013-06-19 ENCOUNTER — Ambulatory Visit (INDEPENDENT_AMBULATORY_CARE_PROVIDER_SITE_OTHER): Payer: Medicare Other | Admitting: Internal Medicine

## 2013-06-19 ENCOUNTER — Telehealth: Payer: Self-pay | Admitting: Internal Medicine

## 2013-06-19 ENCOUNTER — Other Ambulatory Visit (INDEPENDENT_AMBULATORY_CARE_PROVIDER_SITE_OTHER): Payer: Medicare Other

## 2013-06-19 ENCOUNTER — Encounter: Payer: Self-pay | Admitting: Internal Medicine

## 2013-06-19 VITALS — BP 122/60 | HR 75 | Temp 97.7°F | Ht 63.0 in | Wt 148.6 lb

## 2013-06-19 DIAGNOSIS — E785 Hyperlipidemia, unspecified: Secondary | ICD-10-CM

## 2013-06-19 DIAGNOSIS — I10 Essential (primary) hypertension: Secondary | ICD-10-CM | POA: Diagnosis not present

## 2013-06-19 DIAGNOSIS — E119 Type 2 diabetes mellitus without complications: Secondary | ICD-10-CM

## 2013-06-19 DIAGNOSIS — J310 Chronic rhinitis: Secondary | ICD-10-CM

## 2013-06-19 LAB — BASIC METABOLIC PANEL
BUN: 27 mg/dL — ABNORMAL HIGH (ref 6–23)
CO2: 27 mEq/L (ref 19–32)
Chloride: 101 mEq/L (ref 96–112)
Creatinine, Ser: 1.2 mg/dL (ref 0.4–1.2)
Glucose, Bld: 144 mg/dL — ABNORMAL HIGH (ref 70–99)
Potassium: 4.1 mEq/L (ref 3.5–5.1)

## 2013-06-19 LAB — HEPATIC FUNCTION PANEL
ALT: 16 U/L (ref 0–35)
Albumin: 4.3 g/dL (ref 3.5–5.2)
Alkaline Phosphatase: 51 U/L (ref 39–117)
Bilirubin, Direct: 0 mg/dL (ref 0.0–0.3)
Total Bilirubin: 0.4 mg/dL (ref 0.3–1.2)
Total Protein: 7.5 g/dL (ref 6.0–8.3)

## 2013-06-19 LAB — LIPID PANEL
Cholesterol: 279 mg/dL — ABNORMAL HIGH (ref 0–200)
Total CHOL/HDL Ratio: 6

## 2013-06-19 LAB — URINALYSIS, ROUTINE W REFLEX MICROSCOPIC
Hgb urine dipstick: NEGATIVE
Nitrite: NEGATIVE
Specific Gravity, Urine: 1.025 (ref 1.000–1.030)
Urobilinogen, UA: 0.2 (ref 0.0–1.0)
pH: 6 (ref 5.0–8.0)

## 2013-06-19 LAB — CBC WITH DIFFERENTIAL/PLATELET
Basophils Absolute: 0 10*3/uL (ref 0.0–0.1)
Hemoglobin: 12.3 g/dL (ref 12.0–15.0)
Lymphocytes Relative: 33 % (ref 12.0–46.0)
MCHC: 32.9 g/dL (ref 30.0–36.0)
Monocytes Relative: 11.3 % (ref 3.0–12.0)
Neutro Abs: 3.7 10*3/uL (ref 1.4–7.7)
Neutrophils Relative %: 50.7 % (ref 43.0–77.0)
RDW: 15.9 % — ABNORMAL HIGH (ref 11.5–14.6)
WBC: 7.3 10*3/uL (ref 4.5–10.5)

## 2013-06-19 LAB — MICROALBUMIN / CREATININE URINE RATIO: Microalb Creat Ratio: 0.9 mg/g (ref 0.0–30.0)

## 2013-06-19 LAB — HEMOGLOBIN A1C: Hgb A1c MFr Bld: 8.9 % — ABNORMAL HIGH (ref 4.6–6.5)

## 2013-06-19 LAB — TSH: TSH: 3.6 u[IU]/mL (ref 0.35–5.50)

## 2013-06-19 MED ORDER — SIMVASTATIN 40 MG PO TABS: 40.0000 mg | ORAL_TABLET | Freq: Every day | ORAL | Status: DC

## 2013-06-19 MED ORDER — TRAMADOL HCL 50 MG PO TABS: ORAL_TABLET | ORAL | Status: DC

## 2013-06-19 MED ORDER — SERTRALINE HCL 100 MG PO TABS: ORAL_TABLET | ORAL | Status: DC

## 2013-06-19 NOTE — Telephone Encounter (Signed)
Spoke with pt. She needed a refill on simvastatin. I advised will send in RX. Nothing further needed

## 2013-06-19 NOTE — Progress Notes (Signed)
Subjective:    Patient ID: [name], female    DOB: Mar 28, 1929  MRN: [id number]   Brief patient profile:  65  yobf quit smoking 1987 with obesity/ aodm with multiple chronic complaints and documented rhinitis with inconsistent compliance with nasal steroids    History of Present Illness  May 20, 2010 ov c/o waking up with HA almost every am. She states that her back/ left positional shoulder pain is the same- no better or worse, but is resolved with aleve. She c/o dry cough at night x 1-2 wks. bp too high rec double dose of diovan   July 13, 2010 HA and dizziness. She also c/o ringing in both ears x several days. really bad 11/5 better now and hears ok.. no room spinning but dizzy, has not tried meclizine. no ha, ataxia, nausea.   04/20/2011 f/u ov/Zeppelin Beckstrand cc cpx/ leg cramping once or twice a week x months. No pain walking, no numbness.  Occ dizzy but not orthostatic related and goes away before she has time to try her meclizine prn rec Try quinine in the form of tonic water at bedtime as needed for cramps See calendar for specific medication instructions   07/20/2011 f/u ov/Roschelle Calandra cc hurting all over every night when lie down with stiffness in am indolent onset, progressively worse not much better with aleve but only takes one a day.  No cough or sob. No overt symptoms of hyper or hypoglycemia.  Recurrent L ant cp x years, worse in sitting position, assoc with constipation.  >>rx trazadone.   07/27/2011 Follow up and Med review Pt returns for follow up and med review. Last visit , trouble with sleeping, Trazadone was added. Pt says she is sleeping much better. Does complain over last 5 days , cough and congestion have started. Complains of head congestion, prod cough with small amounts of yellow mucus x1week .  No hemoptysis . Does have some throat drainage and dry cough at night. We discussed her chlor tabs for this-she has not started this yet. Using mucinex without much help.   We reviewed all her meds and organized them into her med calendar with update, and pt education  Labs done last ov were essentially unremarkable except for A1C was unchanged at 8.4 - we discussed diet.  rec Zpack Take as directed.  Follow med calendar closely and bring to each visit.    10/24/2011 ov Dior Stepter/ f/u multiple chronic problems fasting for f/u lost the new med calendar provided but c/o chest pains went away " awhile ago" not reproducible with exertions.  HA no better, worse with anxiety not in am's and no assoc neuro cos, napping in afternoons sleeping poorly on trazadone 25. rec Please remember to go to the lab > all labs improving including hgba1c down from 8.4 to 7.6 Increase trazadone to 50 mg one at bedtime and no napping during the day until sleeping better at night See calendar for specific medication instructions      01/23/2012 f/u ov/Sharyl Panchal f/u hbp/ dm/hyperlipidemia/ insomnia cc sleeping better on melatonin, more active during the day and no high or low sugar symtoms, no tia or claudication or sob though she's relatively sedentary.  Following meds on calendar well, only stopped the trazadone because found it didn't sleep as well as melatonin.  Only using from prn list occ aleve. rec Follow med calendar   04/23/2012 f/u ov/Zena Vitelli cc multiple active medical problems dm/ hbp / hyperlipidemia. Main complaint is variable R back pain x  3 months previously injected but don't remember by whom, no radicular symptoms. Has not tried nsaids though they are already on list for prn. No localized numbness/ weakness/ no pain with cough.  Overall worse with walking, better supine     >>aleve, fasting labs , tspine -DJD changes   06/04/2012 Follow up and med review   We reviewed all her meds and organized them into her med calendar with update, and pt education  Appears she is taking meds correctly and consistently.  CPX last ov showed DM not as well controlled with  A1C up 8.5 from 7.6  We  discussed diet and need to increased Amaryl. No low sugars on current regimen.  B/P has been running up 140-160.  She has been taking Aleve on regular basis for joint pains.  We discussed stopping NSAIDS and using tylenol arthritis and vicodin for severe pain.  Renal fxn was worse with scr at 1.3  No cp, dyspnea or edema.  >>Stop NSAIDS , Increase Glimepiride 4mg  daily  07/02/2012 Follow up  Patient returns for a 4 week followup for hypertension and diabetes. Last visit blood pressure was elevated. She was recommended to stop all NSAIDs Blood sugars were also elevated. She was instructed to increase glimepiride 4 mg daily Blood sugars have been averaging around 100-150 fasting, she has had no hypoglycemia episodes. Blood pressures are averaging 749 systolic She denies any chest pain, orthopnea, PND, or leg swelling rec Change Diovan 320/12.5mg  daily  Avoid NSAIDS -advil, excederin -they can raise blood pressure and affect the kidneys.  Check sugars 3 days a week ,  call if <80 or >200 Check blood pressures 3 days a week -call if >160    01/10/2013 f/u ov/Stavros Cail multiple chronic issues hbp, dm, arthritis Chief Complaint  Patient presents with  . Acute Visit    Pt c/o fatigue, nausea and "feeling jittery" x 2 wks.   No relation to meals or activity, no cp or ha or sob, symptoms come and go day > nightly >> no change rx  01/30/13 Follow up and med review  W except taking 1/2 tab of statin and suppose to a whole tabe reviewed all her meds and organized them into her med calendar with update, and pt education  Appears she is taking meds correctly,.  Complains of head congestion, runny nose, wheezing qhs x2 weeks d/t allergies. Does have thick mucus at times.  No fever , chest pain or edema.  Labs from last ov with A1C tr up at 8.7 and LDL 140  We discussed DM diet , sugars .  Discussed if not improving will need to consider Insulin therapy at next ov +/- Endocrine referral.  rec Add  Amlodipine 5mg  daily  Low salt diet  Low sweet diet  Zpack Take as directed.  Stop Mucinex D.  Avoid NSAIDS -aleve, advil, etc Avoid Decongestants -sudafed meds.  Restart Metformin 500mg  Twice daily   Saline nasal rinses As needed  Nasal congestion .  Use Nasonex 2 puffs Twice daily  Until sample is gone.  Follow med calendar closely and bring to each visit.  We will recheck Diabetes lab at next office visit , if not improved will need to start on Insulin .  Follow up [place] in 3 months with labs and As needed   Increase Simvastatin 40mg  At bedtime  03/19/2013 f/u ov/Aahan Marques re multiple complaints Chief Complaint  Patient presents with  . Follow-up    Pt c/o runny nose, nasal congestion and  dry cough for the past several wks. She also stiffness in her neck for the past wk.   nasal congestion bilaterally difficulty breathing through nose since mid May 2014,  Some yellow mucus, difficulty breathing nose is what's keeping her up, assoc with hacking cough and generalized chest and trunk discomfort worse with coughing.  No sinus pain, nasal discharge is beige to watery. Has not taken meds on action plan part of calendar for symptoms addressed on the list from previous visits >>augmentin rx    04/02/2013 Follow up and med review  We reviewed all her meds and organized them into her med calendar with update, and pt education  Appears she is taking meds correctly, Patient was seen 2 weeks ago and felt to have a sinus infection. She was given Augmentin but unfortunately was only able to take it for 4 days. Due to severe diarrhea. Patient reports her sinus condition did improve however, never totally went away, and is starting to increase with sinus pain pressure.  Continues to complain of some intermittent neck pain. Pain medicines, to help. Patient was simvastatin was stopped for possible relation to, muscle pain. However, she did not see any change off of her simvastatin for the last 2 week Chest  x-ray done last visit was normal Patient denies any hemoptysis, chest pain, orthopnea, PND, fever, extremity weakness, visual or speech changes, or difficulty swallowing. >>omnicef and pred pack  04/19/2013 Follow Up  Patient returns for a two-week followup Hasn't had a slow to resolve, sinusitis, and asthmatic bronchitis. She was treated with Omnicef for 10 days and a prednisone taper. Patient reports that she is much improved with decreased cough and congestion. Patient also is having some cervical neck pain felt to be musculoskeletal. This is also improved with prednisone.    06/19/2013 f/u ov/[place] Delauder re: arthritis/ hbp/ depression Chief Complaint  Patient presents with  . Annual Exam    Pt states doing well and denies any co's today.      No  cp or chest tightness, subjective wheeze overt sinus or hb symptoms. No unusual exp hx or h/o childhood pna/ asthma or knowledge of premature birth.  Sleeping ok without nocturnal  or early am exacerbation  of respiratory  C/o's.  Also denies any obvious fluctuation of symptoms with weather or environmental changes or other aggravating or alleviating factors except as outlined above   Current Medications, Allergies, Complete Past Medical History, Past Surgical History, Family History, and Social History were reviewed in [employer] record.  ROS  The following are not active complaints unless bolded sore throat, dysphagia, dental problems, itching, sneezing,  nasal congestion or excess/ purulent secretions, ear ache,   fever, chills, sweats, unintended wt loss, pleuritic or exertional cp, hemoptysis,  orthopnea pnd or leg swelling, presyncope, palpitations, heartburn, abdominal pain, anorexia, nausea, vomiting, diarrhea  or change in bowel or urinary habits, change in stools or urine, dysuria,hematuria,  rash, arthralgias, visual complaints, headache, numbness weakness or ataxia or problems with walking or coordination,  change in  mood/affect or memory.          Past Medical History:  HYPERTENSION (ICD-401.9)  HYPERLIPIDEMIA (ICD-272.4)  - target --LDL <70 (DM) due to DM  AODM  CHRONIC COUGH  - onset 1990's, almost daly since> resolved May 04, 2009 with ppi two times a day and chlortrimeton  - denied improved on ppi so try off effective August 19, 2009 > worse cough and constipation > restarted  CHRONIC RHINITIS (ICD-472.0)-non  adherent with nasal steroids  -Ct sinus nml (08/1999, 01/2005)  Hx of URTICARIA (ICD-708.9)  LUMBAR RADICULOPATHY, RIGHT (ICD-724.4)  DEPRESSION (ICD-311)   MORBID OBESITY (ICD-278.01)  - Target wt = 158 for BMI < 30  VIT D DEFICIENCY dx 08/28/08  - Rx with 50 k biw x 12weeks 09/01/08 > recheck 04/20/2011  Osteopenia  -BMD 04/2000 -nml (-0.4 hip)  -BMD 09/15/08 T spine 3.3, L Fem -1.0, R Fem - 1.0  HEALTH MAINENANCE.......................................................................................Marland KitchenWert  -Td 09/2004 -Pneumovax (1998), 05/02/2008  -Mammogram (9/08)-nml , >06/04/2012 >> - Colonsocopy nl 05/2008............................................................................Marland KitchenLina Sar  -GYN......................................................................................................... Marcelle Overlie   -CPX  06/19/2013  -refused flu shot August 18, 2009 and May 20, 2010 , 06/04/2012  COMPLEX MED REGIMEN  -- med calendar completed/adjusted reviewed November 18, 2009 but not following > using it May 20, 2010 , 07/27/2011 , 06/04/2012 .04/02/2013       Objective:   Physical Exam  wt   155 Jan 25, 2010 > Wt 153 04/20/2011 >  10/24/2011 152 > 01/23/2012 151> 04/23/2012  149>152 06/04/2012 >  147 01/10/2013 >151 02/01/2013 >  148> 147 04/02/2013 >145 04/19/2013 > 06/19/2013 148  amb bf nad with nl vital signs  HEENT: edentulous with dentures in place, nl turbinates, and orophanx. Nl external ear canals without cough reflex  Max sinus tendeness  NECK : without  JVD/Nodes/TM/ nl carotid upstrokes bilaterally  LUNGS:  no acc muscle use, clear to A and P bilaterally without cough on insp or exp maneuvers  CV: RRR no s3 or murmur or increase in P2, no edema .  Pulses sym bilaterally in feet ABD: soft and nontender with nl excursion in the supine position. No bruits or organomegaly, bowel sounds nl  MS: warm without deformities, calf tenderness, cyanosis or clubbing. Neck full rom, nml grips and equal strength,  Neuro alert, no motor, cerebellar deficits, nl gait.  Skin: no lesions     CXR  06/19/2013 :  Stable chest x-ray. Borderline cardiomegaly. No active lung disease.           Assessment & Plan:

## 2013-06-19 NOTE — Progress Notes (Signed)
Quick Note:  Spoke with pt and notified of results per Dr. Wert. Pt verbalized understanding and denied any questions.  ______ 

## 2013-06-19 NOTE — Patient Instructions (Signed)
Please remember to go to the lab and x-ray department downstairs for your tests - we will call you with the results when they are available.     See Tammy NP in 4  weeks with all your medications, even over the counter meds, separated in two separate bags, the ones you take no matter what vs the ones you stop once you feel better and take only as needed when you feel you need them.   Tammy  will generate for you a new user friendly medication calendar that will put us all on the same page re: your medication use.     Without this process, it simply isn't possible to assure that we are providing  your outpatient care  with  the attention to detail we feel you deserve.   If we cannot assure that you're getting that kind of care,  then we cannot manage your problem effectively from this clinic.  Once you have seen Tammy and we are sure that we're all on the same page with your medication use she will arrange follow up with me.  

## 2013-06-20 ENCOUNTER — Encounter: Payer: Self-pay | Admitting: Internal Medicine

## 2013-06-20 NOTE — Assessment & Plan Note (Signed)
Adequate control on present rx, reviewed > no change in rx needed   

## 2013-06-20 NOTE — Assessment & Plan Note (Signed)
Not Adequate control on present rx, reviewed > strongly doubt she's compliant. First step if full med reconciliation    To keep things simple, I have asked the patient to first separate medicines that are perceived as maintenance, that is to be taken daily "no matter what", from those medicines that are taken on only on an as-needed basis and I have given the patient examples of both, and then return to see our NP to generate a  detailed  medication calendar which should be followed until the next physician sees the patient and updates it.

## 2013-07-17 ENCOUNTER — Encounter: Payer: Medicare Other | Admitting: Adult Health

## 2013-07-29 ENCOUNTER — Encounter: Payer: Self-pay | Admitting: Adult Health

## 2013-07-29 ENCOUNTER — Ambulatory Visit (INDEPENDENT_AMBULATORY_CARE_PROVIDER_SITE_OTHER): Payer: Medicare Other | Admitting: Adult Health

## 2013-07-29 VITALS — BP 138/74 | HR 71 | Temp 97.5°F | Ht 62.0 in | Wt 148.8 lb

## 2013-07-29 DIAGNOSIS — E119 Type 2 diabetes mellitus without complications: Secondary | ICD-10-CM

## 2013-07-29 DIAGNOSIS — I10 Essential (primary) hypertension: Secondary | ICD-10-CM | POA: Diagnosis not present

## 2013-07-29 DIAGNOSIS — E785 Hyperlipidemia, unspecified: Secondary | ICD-10-CM | POA: Diagnosis not present

## 2013-07-29 NOTE — Progress Notes (Signed)
Subjective:    Patient ID: [name], female    DOB: Mar 28, 1929  MRN: [id number]   Brief patient profile:  65  yobf quit smoking 1987 with obesity/ aodm with multiple chronic complaints and documented rhinitis with inconsistent compliance with nasal steroids    History of Present Illness  May 20, 2010 ov c/o waking up with HA almost every am. She states that her back/ left positional shoulder pain is the same- no better or worse, but is resolved with aleve. She c/o dry cough at night x 1-2 wks. bp too high rec double dose of diovan   July 13, 2010 HA and dizziness. She also c/o ringing in both ears x several days. really bad 11/5 better now and hears ok.. no room spinning but dizzy, has not tried meclizine. no ha, ataxia, nausea.   04/20/2011 f/u ov/Wert cc cpx/ leg cramping once or twice a week x months. No pain walking, no numbness.  Occ dizzy but not orthostatic related and goes away before she has time to try her meclizine prn rec Try quinine in the form of tonic water at bedtime as needed for cramps See calendar for specific medication instructions   07/20/2011 f/u ov/Wert cc hurting all over every night when lie down with stiffness in am indolent onset, progressively worse not much better with aleve but only takes one a day.  No cough or sob. No overt symptoms of hyper or hypoglycemia.  Recurrent L ant cp x years, worse in sitting position, assoc with constipation.  >>rx trazadone.   07/27/2011 Follow up and Med review Pt returns for follow up and med review. Last visit , trouble with sleeping, Trazadone was added. Pt says she is sleeping much better. Does complain over last 5 days , cough and congestion have started. Complains of head congestion, prod cough with small amounts of yellow mucus x1week .  No hemoptysis . Does have some throat drainage and dry cough at night. We discussed her chlor tabs for this-she has not started this yet. Using mucinex without much help.   We reviewed all her meds and organized them into her med calendar with update, and pt education  Labs done last ov were essentially unremarkable except for A1C was unchanged at 8.4 - we discussed diet.  rec Zpack Take as directed.  Follow med calendar closely and bring to each visit.    10/24/2011 ov Wert/ f/u multiple chronic problems fasting for f/u lost the new med calendar provided but c/o chest pains went away " awhile ago" not reproducible with exertions.  HA no better, worse with anxiety not in am's and no assoc neuro cos, napping in afternoons sleeping poorly on trazadone 25. rec Please remember to go to the lab > all labs improving including hgba1c down from 8.4 to 7.6 Increase trazadone to 50 mg one at bedtime and no napping during the day until sleeping better at night See calendar for specific medication instructions      01/23/2012 f/u ov/Wert f/u hbp/ dm/hyperlipidemia/ insomnia cc sleeping better on melatonin, more active during the day and no high or low sugar symtoms, no tia or claudication or sob though she's relatively sedentary.  Following meds on calendar well, only stopped the trazadone because found it didn't sleep as well as melatonin.  Only using from prn list occ aleve. rec Follow med calendar   04/23/2012 f/u ov/Wert cc multiple active medical problems dm/ hbp / hyperlipidemia. Main complaint is variable R back pain x  3 months previously injected but don't remember by whom, no radicular symptoms. Has not tried nsaids though they are already on list for prn. No localized numbness/ weakness/ no pain with cough.  Overall worse with walking, better supine     >>aleve, fasting labs , tspine -DJD changes   06/04/2012 Follow up and med review   We reviewed all her meds and organized them into her med calendar with update, and pt education  Appears she is taking meds correctly and consistently.  CPX last ov showed DM not as well controlled with  A1C up 8.5 from 7.6  We  discussed diet and need to increased Amaryl. No low sugars on current regimen.  B/P has been running up 140-160.  She has been taking Aleve on regular basis for joint pains.  We discussed stopping NSAIDS and using tylenol arthritis and vicodin for severe pain.  Renal fxn was worse with scr at 1.3  No cp, dyspnea or edema.  >>Stop NSAIDS , Increase Glimepiride 4mg  daily  07/02/2012 Follow up  Patient returns for a 4 week followup for hypertension and diabetes. Last visit blood pressure was elevated. She was recommended to stop all NSAIDs Blood sugars were also elevated. She was instructed to increase glimepiride 4 mg daily Blood sugars have been averaging around 100-150 fasting, she has had no hypoglycemia episodes. Blood pressures are averaging 749 systolic She denies any chest pain, orthopnea, PND, or leg swelling rec Change Diovan 320/12.5mg  daily  Avoid NSAIDS -advil, excederin -they can raise blood pressure and affect the kidneys.  Check sugars 3 days a week ,  call if <80 or >200 Check blood pressures 3 days a week -call if >160    01/10/2013 f/u ov/Wert multiple chronic issues hbp, dm, arthritis Chief Complaint  Patient presents with  . Acute Visit    Pt c/o fatigue, nausea and "feeling jittery" x 2 wks.   No relation to meals or activity, no cp or ha or sob, symptoms come and go day > nightly >> no change rx  01/30/13 Follow up and med review  W except taking 1/2 tab of statin and suppose to a whole tabe reviewed all her meds and organized them into her med calendar with update, and pt education  Appears she is taking meds correctly,.  Complains of head congestion, runny nose, wheezing qhs x2 weeks d/t allergies. Does have thick mucus at times.  No fever , chest pain or edema.  Labs from last ov with A1C tr up at 8.7 and LDL 140  We discussed DM diet , sugars .  Discussed if not improving will need to consider Insulin therapy at next ov +/- Endocrine referral.  rec Add  Amlodipine 5mg  daily  Low salt diet  Low sweet diet  Zpack Take as directed.  Stop Mucinex D.  Avoid NSAIDS -aleve, advil, etc Avoid Decongestants -sudafed meds.  Restart Metformin 500mg  Twice daily   Saline nasal rinses As needed  Nasal congestion .  Use Nasonex 2 puffs Twice daily  Until sample is gone.  Follow med calendar closely and bring to each visit.  We will recheck Diabetes lab at next office visit , if not improved will need to start on Insulin .  Follow up [facility] in 3 months with labs and As needed   Increase Simvastatin 40mg  At bedtime  03/19/2013 f/u ov/Wert re multiple complaints Chief Complaint  Patient presents with  . Follow-up    Pt c/o runny nose, nasal congestion and  dry cough for the past several wks. She also stiffness in her neck for the past wk.   nasal congestion bilaterally difficulty breathing through nose since mid May 2014,  Some yellow mucus, difficulty breathing nose is what's keeping her up, assoc with hacking cough and generalized chest and trunk discomfort worse with coughing.  No sinus pain, nasal discharge is beige to watery. Has not taken meds on action plan part of calendar for symptoms addressed on the list from previous visits >>augmentin rx    04/02/2013 Follow up and med review  We reviewed all her meds and organized them into her med calendar with update, and pt education  Appears she is taking meds correctly, Patient was seen 2 weeks ago and felt to have a sinus infection. She was given Augmentin but unfortunately was only able to take it for 4 days. Due to severe diarrhea. Patient reports her sinus condition did improve however, never totally went away, and is starting to increase with sinus pain pressure.  Continues to complain of some intermittent neck pain. Pain medicines, to help. Patient was simvastatin was stopped for possible relation to, muscle pain. However, she did not see any change off of her simvastatin for the last 2 week Chest  x-ray done last visit was normal Patient denies any hemoptysis, chest pain, orthopnea, PND, fever, extremity weakness, visual or speech changes, or difficulty swallowing. >>omnicef and pred pack  04/19/2013 Follow Up  Patient returns for a two-week followup Hasn't had a slow to resolve, sinusitis, and asthmatic bronchitis. She was treated with Omnicef for 10 days and a prednisone taper. Patient reports that she is much improved with decreased cough and congestion. Patient also is having some cervical neck pain felt to be musculoskeletal. This is also improved with prednisone.    06/19/2013 f/u ov/Wert re: arthritis/ hbp/ depression Chief Complaint  Patient presents with  . Annual Exam    Pt states doing well and denies any co's today.    >>labs   07/29/2013 Follow up  Patient returns for one-month followup, and medication review. We reviewed all her medications and organized them into a medication calendar. Patient was seen last visit for a physical. Labs showed her diabetes and, hyperlipidemia. We're not at goal. A1c had increased to 8.9 and her LDL was at 190. Patient explains that she had recently had a slow to resolve bronchitis, and was treated with antibiotics, and prednisone. Patient says it took her blood sugars antibiotic, it down after this. She also held her cholesterol medicine while she was on antibiotics, and prednisone and had just restarted them. Prior to her lab work. Patient denies any polyuria, polydipsia, chest pain, orthopnea, PND, or leg swelling. We discussed in detail a healthy, diabetic diet, along with low-cholesterol choices. Patient has agreed that if her diabetes is not under better control that we will refer her to an endocrinologist for further evaluation  and treatment options.    Current Medications, Allergies, Complete Past Medical History, Past Surgical History, Family History, and Social History were reviewed in [employer]  record.  ROS  The following are not active complaints unless bolded sore throat, dysphagia, dental problems, itching, sneezing,  nasal congestion or excess/ purulent secretions, ear ache,   fever, chills, sweats, unintended wt loss, pleuritic or exertional cp, hemoptysis,  orthopnea pnd or leg swelling, presyncope, palpitations, heartburn, abdominal pain, anorexia, nausea, vomiting, diarrhea  or change in bowel or urinary habits, change in stools or urine, dysuria,hematuria,  rash,  arthralgias, visual complaints, headache, numbness weakness or ataxia or problems with walking or coordination,  change in mood/affect or memory.          Past Medical History:  HYPERTENSION (ICD-401.9)  HYPERLIPIDEMIA (ICD-272.4)  - target --LDL <70 (DM) due to DM  AODM  CHRONIC COUGH  - onset 1990's, almost daly since> resolved May 04, 2009 with ppi two times a day and chlortrimeton  - denied improved on ppi so try off effective August 19, 2009 > worse cough and constipation > restarted  CHRONIC RHINITIS (ICD-472.0)-non adherent with nasal steroids  -Ct sinus nml (08/1999, 01/2005)  Hx of URTICARIA (ICD-708.9)  LUMBAR RADICULOPATHY, RIGHT (ICD-724.4)  DEPRESSION (ICD-311)   MORBID OBESITY (ICD-278.01)  - Target wt = 158 for BMI < 30  VIT D DEFICIENCY dx 08/28/08  - Rx with 50 k biw x 12weeks 09/01/08 > recheck 04/20/2011  Osteopenia  -BMD 04/2000 -nml (-0.4 hip)  -BMD 09/15/08 T spine 3.3, L Fem -1.0, R Fem - 1.0  HEALTH MAINENANCE.......................................................................................Marland KitchenWert  -Td 09/2004 -Pneumovax (1998), 05/02/2008  -Mammogram (9/08)-nml , >06/04/2012 >> - Colonsocopy nl 05/2008............................................................................Marland KitchenLina Sar  -GYN......................................................................................................... Marcelle Overlie   -CPX  06/19/2013  -refused flu shot August 18, 2009 and May 20, 2010 , 06/04/2012  COMPLEX MED REGIMEN  -- med calendar completed/adjusted reviewed November 18, 2009 but not following > using it May 20, 2010 , 07/27/2011 , 06/04/2012 .04/02/2013 , 07/29/2013       Objective:   Physical Exam  wt   155 Jan 25, 2010 > Wt 153 04/20/2011 >  10/24/2011 152 > 01/23/2012 151> 04/23/2012  149>152 06/04/2012 >  147 01/10/2013 >151 02/01/2013 >  148> 147 04/02/2013 >145 04/19/2013 > 06/19/2013 148 >148 07/29/2013  amb bf nad with nl vital signs  HEENT: edentulous with dentures in place, nl turbinates, and orophanx. Nl external ear canals without cough reflex  Max sinus tendeness  NECK : without JVD/Nodes/TM/ nl carotid upstrokes bilaterally  LUNGS:  no acc muscle use, clear to A and P bilaterally without cough on insp or exp maneuvers  CV: RRR no s3 or murmur or increase in P2, no edema .  Pulses sym bilaterally in feet ABD: soft and nontender with nl excursion in the supine position. No bruits or organomegaly, bowel sounds nl  MS: warm without deformities, calf tenderness, cyanosis or clubbing. Neck full rom, nml grips and equal strength,  Neuro alert, no motor, cerebellar deficits, nl gait.  Skin: no lesions     CXR  06/19/2013 :  Stable chest x-ray. Borderline cardiomegaly. No active lung disease.           Assessment & Plan:

## 2013-07-29 NOTE — Assessment & Plan Note (Signed)
Diabetes currently not at goal. Patient advised on a, improved. Diet alone medication compliance. Patient will be referred to endocrinology if A1c is not improved at next (months.

## 2013-07-29 NOTE — Assessment & Plan Note (Signed)
LDL not at goal. Patient advised on a healthy, low-fat, cholesterol diet. Patient will continue on her statin therapy and is encouraged medication compliance Patient's medications were reviewed today and patient education was given. Computerized medication calendar was adjusted/completed

## 2013-07-29 NOTE — Assessment & Plan Note (Signed)
At goal. Continue on current regimen

## 2013-07-29 NOTE — Patient Instructions (Signed)
Low salt diet  Low sweet diet  Follow med calendar closely and bring to each visit.  We will recheck Diabetes and Cholesterol  labs at next office visit , if not improved will need to refer to Diabetic specialist .  Follow up [facility] in 3 months with labs and As needed

## 2013-07-31 NOTE — Addendum Note (Signed)
Addended by: Boone Master E on: 07/31/2013 10:47 AM   Modules accepted: Orders

## 2013-08-06 ENCOUNTER — Other Ambulatory Visit: Payer: Self-pay | Admitting: Internal Medicine

## 2013-08-06 MED ORDER — VALSARTAN-HYDROCHLOROTHIAZIDE 160-12.5 MG PO TABS: 1.0000 | ORAL_TABLET | Freq: Two times a day (BID) | ORAL | Status: DC

## 2013-08-06 MED ORDER — SITAGLIPTIN PHOSPHATE 100 MG PO TABS: ORAL_TABLET | ORAL | Status: DC

## 2013-08-06 MED ORDER — SERTRALINE HCL 100 MG PO TABS: ORAL_TABLET | ORAL | Status: DC

## 2013-08-06 MED ORDER — SIMVASTATIN 40 MG PO TABS: 40.0000 mg | ORAL_TABLET | Freq: Every day | ORAL | Status: DC

## 2013-08-08 ENCOUNTER — Other Ambulatory Visit: Payer: Self-pay | Admitting: Adult Health

## 2013-08-08 ENCOUNTER — Other Ambulatory Visit: Payer: Self-pay | Admitting: Internal Medicine

## 2013-08-08 MED ORDER — AMLODIPINE BESYLATE 5 MG PO TABS: 5.0000 mg | ORAL_TABLET | Freq: Every day | ORAL | Status: DC

## 2013-08-08 MED ORDER — GLIMEPIRIDE 4 MG PO TABS: ORAL_TABLET | ORAL | Status: DC

## 2013-08-08 MED ORDER — METFORMIN HCL 500 MG PO TABS: ORAL_TABLET | ORAL | Status: DC

## 2013-08-08 NOTE — Telephone Encounter (Signed)
Received fax from [facility] [address] requesting 90 day supply for pt's Glimiperide 4mg  and Amlodipine 5mg  Last ov 11.24.14 Upcoming ov 2.23.15 Rx adjusted as requested

## 2013-10-15 ENCOUNTER — Other Ambulatory Visit: Payer: Self-pay | Admitting: Internal Medicine

## 2013-10-28 ENCOUNTER — Encounter: Payer: Self-pay | Admitting: Adult Health

## 2013-10-28 ENCOUNTER — Other Ambulatory Visit (INDEPENDENT_AMBULATORY_CARE_PROVIDER_SITE_OTHER): Payer: Medicare Other

## 2013-10-28 ENCOUNTER — Ambulatory Visit (INDEPENDENT_AMBULATORY_CARE_PROVIDER_SITE_OTHER): Payer: Medicare Other | Admitting: Adult Health

## 2013-10-28 VITALS — BP 112/64 | HR 71 | Temp 97.1°F | Ht 62.0 in | Wt 148.6 lb

## 2013-10-28 DIAGNOSIS — I10 Essential (primary) hypertension: Secondary | ICD-10-CM

## 2013-10-28 DIAGNOSIS — E785 Hyperlipidemia, unspecified: Secondary | ICD-10-CM

## 2013-10-28 DIAGNOSIS — E559 Vitamin D deficiency, unspecified: Secondary | ICD-10-CM | POA: Diagnosis not present

## 2013-10-28 DIAGNOSIS — M2550 Pain in unspecified joint: Secondary | ICD-10-CM

## 2013-10-28 DIAGNOSIS — E119 Type 2 diabetes mellitus without complications: Secondary | ICD-10-CM

## 2013-10-28 LAB — HEPATIC FUNCTION PANEL
ALK PHOS: 47 U/L (ref 39–117)
ALT: 12 U/L (ref 0–35)
AST: 16 U/L (ref 0–37)
Albumin: 4.2 g/dL (ref 3.5–5.2)
BILIRUBIN TOTAL: 0.7 mg/dL (ref 0.3–1.2)
Bilirubin, Direct: 0 mg/dL (ref 0.0–0.3)
TOTAL PROTEIN: 7.4 g/dL (ref 6.0–8.3)

## 2013-10-28 LAB — BASIC METABOLIC PANEL
BUN: 26 mg/dL — ABNORMAL HIGH (ref 6–23)
CO2: 28 meq/L (ref 19–32)
CREATININE: 1.3 mg/dL — AB (ref 0.4–1.2)
Calcium: 10.1 mg/dL (ref 8.4–10.5)
Chloride: 106 mEq/L (ref 96–112)
GFR: 48.6 mL/min — ABNORMAL LOW (ref >60.00)
Glucose, Bld: 137 mg/dL — ABNORMAL HIGH (ref 70–99)
POTASSIUM: 4.5 meq/L (ref 3.5–5.1)
SODIUM: 139 meq/L (ref 135–145)

## 2013-10-28 LAB — LIPID PANEL
CHOL/HDL RATIO: 3
Cholesterol: 150 mg/dL (ref 0–200)
HDL: 49.1 mg/dL (ref >39.00)
LDL CALC: 68 mg/dL (ref 0–99)
TRIGLYCERIDES: 164 mg/dL — AB (ref 0.0–149.0)
VLDL: 32.8 mg/dL (ref 0.0–40.0)

## 2013-10-28 LAB — HEMOGLOBIN A1C: HEMOGLOBIN A1C: 8 % — AB (ref 4.6–6.5)

## 2013-10-28 MED ORDER — MELOXICAM 15 MG PO TABS: 15.0000 mg | ORAL_TABLET | Freq: Every day | ORAL | Status: DC

## 2013-10-28 NOTE — Assessment & Plan Note (Signed)
Check vitamin D today. We'll need bone density in near future. However, she was to hold off for right now

## 2013-10-28 NOTE — Assessment & Plan Note (Signed)
Well-controlled on current regimen. ?

## 2013-10-28 NOTE — Progress Notes (Signed)
Subjective:    Patient ID: [name], female    DOB: Mar 28, 1929  MRN: [id number]   Brief patient profile:  65  yobf quit smoking 1987 with obesity/ aodm with multiple chronic complaints and documented rhinitis with inconsistent compliance with nasal steroids    History of Present Illness  May 20, 2010 ov c/o waking up with HA almost every am. She states that her back/ left positional shoulder pain is the same- no better or worse, but is resolved with aleve. She c/o dry cough at night x 1-2 wks. bp too high rec double dose of diovan   July 13, 2010 HA and dizziness. She also c/o ringing in both ears x several days. really bad 11/5 better now and hears ok.. no room spinning but dizzy, has not tried meclizine. no ha, ataxia, nausea.   04/20/2011 f/u ov/Wert cc cpx/ leg cramping once or twice a week x months. No pain walking, no numbness.  Occ dizzy but not orthostatic related and goes away before she has time to try her meclizine prn rec Try quinine in the form of tonic water at bedtime as needed for cramps See calendar for specific medication instructions   07/20/2011 f/u ov/Wert cc hurting all over every night when lie down with stiffness in am indolent onset, progressively worse not much better with aleve but only takes one a day.  No cough or sob. No overt symptoms of hyper or hypoglycemia.  Recurrent L ant cp x years, worse in sitting position, assoc with constipation.  >>rx trazadone.   07/27/2011 Follow up and Med review Pt returns for follow up and med review. Last visit , trouble with sleeping, Trazadone was added. Pt says she is sleeping much better. Does complain over last 5 days , cough and congestion have started. Complains of head congestion, prod cough with small amounts of yellow mucus x1week .  No hemoptysis . Does have some throat drainage and dry cough at night. We discussed her chlor tabs for this-she has not started this yet. Using mucinex without much help.   We reviewed all her meds and organized them into her med calendar with update, and pt education  Labs done last ov were essentially unremarkable except for A1C was unchanged at 8.4 - we discussed diet.  rec Zpack Take as directed.  Follow med calendar closely and bring to each visit.    10/24/2011 ov Wert/ f/u multiple chronic problems fasting for f/u lost the new med calendar provided but c/o chest pains went away " awhile ago" not reproducible with exertions.  HA no better, worse with anxiety not in am's and no assoc neuro cos, napping in afternoons sleeping poorly on trazadone 25. rec Please remember to go to the lab > all labs improving including hgba1c down from 8.4 to 7.6 Increase trazadone to 50 mg one at bedtime and no napping during the day until sleeping better at night See calendar for specific medication instructions      01/23/2012 f/u ov/Wert f/u hbp/ dm/hyperlipidemia/ insomnia cc sleeping better on melatonin, more active during the day and no high or low sugar symtoms, no tia or claudication or sob though she's relatively sedentary.  Following meds on calendar well, only stopped the trazadone because found it didn't sleep as well as melatonin.  Only using from prn list occ aleve. rec Follow med calendar   04/23/2012 f/u ov/Wert cc multiple active medical problems dm/ hbp / hyperlipidemia. Main complaint is variable R back pain x  3 months previously injected but don't remember by whom, no radicular symptoms. Has not tried nsaids though they are already on list for prn. No localized numbness/ weakness/ no pain with cough.  Overall worse with walking, better supine     >>aleve, fasting labs , tspine -DJD changes   06/04/2012 Follow up and med review   We reviewed all her meds and organized them into her med calendar with update, and pt education  Appears she is taking meds correctly and consistently.  CPX last ov showed DM not as well controlled with  A1C up 8.5 from 7.6  We  discussed diet and need to increased Amaryl. No low sugars on current regimen.  B/P has been running up 140-160.  She has been taking Aleve on regular basis for joint pains.  We discussed stopping NSAIDS and using tylenol arthritis and vicodin for severe pain.  Renal fxn was worse with scr at 1.3  No cp, dyspnea or edema.  >>Stop NSAIDS , Increase Glimepiride 4mg  daily  07/02/2012 Follow up  Patient returns for a 4 week followup for hypertension and diabetes. Last visit blood pressure was elevated. She was recommended to stop all NSAIDs Blood sugars were also elevated. She was instructed to increase glimepiride 4 mg daily Blood sugars have been averaging around 100-150 fasting, she has had no hypoglycemia episodes. Blood pressures are averaging 749 systolic She denies any chest pain, orthopnea, PND, or leg swelling rec Change Diovan 320/12.5mg  daily  Avoid NSAIDS -advil, excederin -they can raise blood pressure and affect the kidneys.  Check sugars 3 days a week ,  call if <80 or >200 Check blood pressures 3 days a week -call if >160    01/10/2013 f/u ov/Wert multiple chronic issues hbp, dm, arthritis Chief Complaint  Patient presents with  . Acute Visit    Pt c/o fatigue, nausea and "feeling jittery" x 2 wks.   No relation to meals or activity, no cp or ha or sob, symptoms come and go day > nightly >> no change rx  01/30/13 Follow up and med review  W except taking 1/2 tab of statin and suppose to a whole tabe reviewed all her meds and organized them into her med calendar with update, and pt education  Appears she is taking meds correctly,.  Complains of head congestion, runny nose, wheezing qhs x2 weeks d/t allergies. Does have thick mucus at times.  No fever , chest pain or edema.  Labs from last ov with A1C tr up at 8.7 and LDL 140  We discussed DM diet , sugars .  Discussed if not improving will need to consider Insulin therapy at next ov +/- Endocrine referral.  rec Add  Amlodipine 5mg  daily  Low salt diet  Low sweet diet  Zpack Take as directed.  Stop Mucinex D.  Avoid NSAIDS -aleve, advil, etc Avoid Decongestants -sudafed meds.  Restart Metformin 500mg  Twice daily   Saline nasal rinses As needed  Nasal congestion .  Use Nasonex 2 puffs Twice daily  Until sample is gone.  Follow med calendar closely and bring to each visit.  We will recheck Diabetes lab at next office visit , if not improved will need to start on Insulin .  Follow up [place] in 3 months with labs and As needed   Increase Simvastatin 40mg  At bedtime  03/19/2013 f/u ov/Wert re multiple complaints Chief Complaint  Patient presents with  . Follow-up    Pt c/o runny nose, nasal congestion and  dry cough for the past several wks. She also stiffness in her neck for the past wk.   nasal congestion bilaterally difficulty breathing through nose since mid May 2014,  Some yellow mucus, difficulty breathing nose is what's keeping her up, assoc with hacking cough and generalized chest and trunk discomfort worse with coughing.  No sinus pain, nasal discharge is beige to watery. Has not taken meds on action plan part of calendar for symptoms addressed on the list from previous visits >>augmentin rx    04/02/2013 Follow up and med review  We reviewed all her meds and organized them into her med calendar with update, and pt education  Appears she is taking meds correctly, Patient was seen 2 weeks ago and felt to have a sinus infection. She was given Augmentin but unfortunately was only able to take it for 4 days. Due to severe diarrhea. Patient reports her sinus condition did improve however, never totally went away, and is starting to increase with sinus pain pressure.  Continues to complain of some intermittent neck pain. Pain medicines, to help. Patient was simvastatin was stopped for possible relation to, muscle pain. However, she did not see any change off of her simvastatin for the last 2 week Chest  x-ray done last visit was normal Patient denies any hemoptysis, chest pain, orthopnea, PND, fever, extremity weakness, visual or speech changes, or difficulty swallowing. >>omnicef and pred pack  04/19/2013 Follow Up  Patient returns for a two-week followup Hasn't had a slow to resolve, sinusitis, and asthmatic bronchitis. She was treated with Omnicef for 10 days and a prednisone taper. Patient reports that she is much improved with decreased cough and congestion. Patient also is having some cervical neck pain felt to be musculoskeletal. This is also improved with prednisone.    06/19/2013 f/u ov/Wert re: arthritis/ hbp/ depression Chief Complaint  Patient presents with  . Annual Exam    Pt states doing well and denies any co's today.     >>labs   07/29/2013 Follow up  Patient returns for one-month followup, and medication review. We reviewed all her medications and organized them into a medication calendar. Patient was seen last visit for a physical. Labs showed her diabetes and, hyperlipidemia. We're not at goal. A1c had increased to 8.9 and her LDL was at 190. Patient explains that she had recently had a slow to resolve bronchitis, and was treated with antibiotics, and prednisone. Patient says it took her blood sugars antibiotic, it down after this. She also held her cholesterol medicine while she was on antibiotics, and prednisone and had just restarted them. Prior to her lab work. Patient denies any polyuria, polydipsia, chest pain, orthopnea, PND, or leg swelling. We discussed in detail a healthy, diabetic diet, along with low-cholesterol choices. Patient has agreed that if her diabetes is not under better control that we will refer her to an endocrinologist for further evaluation  and treatment options. >restarted statin   10/28/2013 Follow up  Returns for follow up for DM and Hyperlipidemia.   Complains that her shoulder and back are acting up. More stiffness and pain on/off over last  6 months.  She denies any known injury. She denies any extremity weakness, radicular symptoms. No rash. She has not taken any medications for treatment. We discussed using ice and heat.   FBS running 148-190. Patient says that she has tried eat somewhat better. However, does not do as well. Every day as she should. Patient denies any polyuria, polydipsia, chest  pain, shortness of breath, abdominal pain, nausea, vomiting, or edema. Patient is taking her simvastatin as directed. She is fasting for labs. Today She is overdue for her mammogram and has agreed to go for this in the next couple weeks.   Current Medications, Allergies, Complete Past Medical History, Past Surgical History, Family History, and Social History were reviewed in [employer] record.  ROS  The following are not active complaints unless bolded sore throat, dysphagia, dental problems, itching, sneezing,  nasal congestion or excess/ purulent secretions, ear ache,   fever, chills, sweats, unintended wt loss, pleuritic or exertional cp, hemoptysis,  orthopnea pnd or leg swelling, presyncope, palpitations, heartburn, abdominal pain, anorexia, nausea, vomiting, diarrhea  or change in bowel or urinary habits, change in stools or urine, dysuria,hematuria,  rash, arthralgias, visual complaints, headache, numbness weakness or ataxia or problems with walking or coordination,  change in mood/affect or memory.          Past Medical History:  HYPERTENSION (ICD-401.9)  HYPERLIPIDEMIA (ICD-272.4)  - target --LDL <70 (DM) due to DM  AODM  CHRONIC COUGH  - onset 1990's, almost daly since> resolved May 04, 2009 with ppi two times a day and chlortrimeton  - denied improved on ppi so try off effective August 19, 2009 > worse cough and constipation > restarted  CHRONIC RHINITIS (ICD-472.0)-non adherent with nasal steroids  -Ct sinus nml (08/1999, 01/2005)  Hx of URTICARIA (ICD-708.9)  LUMBAR RADICULOPATHY, RIGHT  (ICD-724.4)  DEPRESSION (ICD-311)   MORBID OBESITY (ICD-278.01)  - Target wt = 158 for BMI < 30  VIT D DEFICIENCY dx 08/28/08  - Rx with 50 k biw x 12weeks 09/01/08 > recheck 04/20/2011  Osteopenia  -BMD 04/2000 -nml (-0.4 hip)  -BMD 09/15/08 T spine 3.3, L Fem -1.0, R Fem - 1.0  HEALTH MAINENANCE.......................................................................................Marland KitchenWert  -Td 09/2004 -Pneumovax (1998), 05/02/2008  -Mammogram (9/08)-nml , >06/04/2012 >> - Colonsocopy nl 05/2008............................................................................Marland KitchenDelfin Edis  -GYN......................................................................................................... Matthew Saras   -CPX  06/19/2013  -refused flu shot August 18, 2009 and May 20, 2010 , 06/04/2012  COMPLEX MED REGIMEN  -- med calendar completed/adjusted reviewed November 18, 2009 but not following > using it May 20, 2010 , 07/27/2011 , 06/04/2012 .04/02/2013       Objective:   Physical Exam  wt   155 Jan 25, 2010 > Wt 153 04/20/2011 >  10/24/2011 152 > 01/23/2012 151> 04/23/2012  149>152 06/04/2012 >  147 01/10/2013 >151 02/01/2013 >  148> 147 04/02/2013 >145 04/19/2013 > 06/19/2013 148 >148 10/28/2013  amb bf nad with nl vital signs  HEENT: edentulous with dentures in place, nl turbinates, and orophanx. Nl external ear canals without cough reflex  Max sinus tendeness  NECK : without JVD/Nodes/TM/ nl carotid upstrokes bilaterally  LUNGS:  no acc muscle use, clear to A and P bilaterally without cough on insp or exp maneuvers  CV: RRR no s3 or murmur or increase in P2, no edema .  Pulses sym bilaterally in feet ABD: soft and nontender with nl excursion in the supine position. No bruits or organomegaly, bowel sounds nl  MS: warm without deformities, calf tenderness, cyanosis or clubbing. Neck full rom, nml grips and equal strength, Neg SLR, no point tenderness along low back /spine. No deformity noted. Equal  strength of LE.   Neuro alert, no motor, cerebellar deficits, nl gait.  Skin: no lesions     CXR  06/19/2013 :  Stable chest x-ray. Borderline cardiomegaly. No active lung disease.  Assessment & Plan:

## 2013-10-28 NOTE — Assessment & Plan Note (Signed)
LDL has not been ago. Her LDL goal is less than 70 with her underlying diabetes and hypertension. Patient's continue on statin therapy. Diet and exercise. Discussed. Fasting labs today.

## 2013-10-28 NOTE — Assessment & Plan Note (Signed)
Shoulder and low back pain  Patient is to use ice and heat to areas as needed. May use Mobic 15 mg daily for one week. Take with food. We'll try to avoid nonsteroidals long-term as she has renal insufficiency with her diabetes and hypertension. If symptoms do not improve, will consider referral for orthopedics.

## 2013-10-28 NOTE — Assessment & Plan Note (Signed)
Not at goal. Patient is encouraged on a diabetic diet. Labs today. Patient advised if A1c is not improved will need referral to endocrinology. She is to continue on her current regimen. For now.

## 2013-10-28 NOTE — Patient Instructions (Signed)
Low salt diet  Low sweet diet  Mobic 15mg  daily with food for 1 week. For shoulder and back pain.  We will recheck Diabetes and Cholesterol  labs at today , if not improved will need to refer to Diabetic specialist .  Mammogram -please get this before next visit.  Follow up [name] in 3 months with labs and As needed

## 2013-10-29 LAB — VITAMIN D 25 HYDROXY (VIT D DEFICIENCY, FRACTURES): VIT D 25 HYDROXY: 54 ng/mL (ref 30–89)

## 2014-01-03 ENCOUNTER — Telehealth: Payer: Self-pay | Admitting: Internal Medicine

## 2014-01-03 MED ORDER — MOMETASONE FUROATE 50 MCG/ACT NA SUSP: 2.0000 | Freq: Every day | NASAL | Status: DC

## 2014-01-03 NOTE — Telephone Encounter (Signed)
Spoke with pt. Aware RX has been sent. Nothing further needed 

## 2014-01-06 ENCOUNTER — Telehealth: Payer: Self-pay | Admitting: Internal Medicine

## 2014-01-06 NOTE — Telephone Encounter (Signed)
Samples have been placed at the front desk. Pt is aware.

## 2014-01-29 ENCOUNTER — Other Ambulatory Visit (INDEPENDENT_AMBULATORY_CARE_PROVIDER_SITE_OTHER): Payer: Medicare Other

## 2014-01-29 ENCOUNTER — Ambulatory Visit (INDEPENDENT_AMBULATORY_CARE_PROVIDER_SITE_OTHER): Payer: Medicare Other | Admitting: Adult Health

## 2014-01-29 ENCOUNTER — Encounter: Payer: Self-pay | Admitting: Adult Health

## 2014-01-29 VITALS — BP 128/68 | HR 67 | Temp 98.0°F | Ht 62.0 in | Wt 150.2 lb

## 2014-01-29 DIAGNOSIS — I10 Essential (primary) hypertension: Secondary | ICD-10-CM | POA: Diagnosis not present

## 2014-01-29 DIAGNOSIS — E119 Type 2 diabetes mellitus without complications: Secondary | ICD-10-CM

## 2014-01-29 DIAGNOSIS — IMO0002 Reserved for concepts with insufficient information to code with codable children: Secondary | ICD-10-CM | POA: Diagnosis not present

## 2014-01-29 DIAGNOSIS — Z23 Encounter for immunization: Secondary | ICD-10-CM | POA: Diagnosis not present

## 2014-01-29 LAB — BASIC METABOLIC PANEL
BUN: 25 mg/dL — ABNORMAL HIGH (ref 6–23)
CHLORIDE: 101 meq/L (ref 96–112)
CO2: 27 mEq/L (ref 19–32)
Calcium: 10.3 mg/dL (ref 8.4–10.5)
Creatinine, Ser: 1.3 mg/dL — ABNORMAL HIGH (ref 0.4–1.2)
GFR: 51.2 mL/min — AB (ref >60.00)
Glucose, Bld: 126 mg/dL — ABNORMAL HIGH (ref 70–99)
POTASSIUM: 4.1 meq/L (ref 3.5–5.1)
Sodium: 138 mEq/L (ref 135–145)

## 2014-01-29 LAB — HEMOGLOBIN A1C: HEMOGLOBIN A1C: 9.3 % — AB (ref 4.6–6.5)

## 2014-01-29 MED ORDER — FLUTICASONE PROPIONATE 50 MCG/ACT NA SUSP: 2.0000 | Freq: Every day | NASAL | Status: DC

## 2014-01-29 NOTE — Addendum Note (Signed)
Addended by: Parke Poisson E on: 01/29/2014 11:54 AM   Modules accepted: Orders, Medications

## 2014-01-29 NOTE — Patient Instructions (Addendum)
Low salt diet  Low sweet diet  Mammogram -please get this before next visit. -DON'T FORGET PLEASE  Set up eye doctor appointment .  Prevnar vaccine today .  We will refer to orthopedist for back pain.  Remember can use tylenol and tramadol for pain as needed.  Follow up with Dr. Melvyn Novas  In 4 months and As needed

## 2014-01-29 NOTE — Assessment & Plan Note (Signed)
Low back pain-recurrent Exam unrevealing  Plan Advised on ice and heat Tylenol and tramadol as needed. For pain Advised to avoid all nonsteroidals. Due to her renal insufficiency Refer to orthopedics per request

## 2014-01-29 NOTE — Progress Notes (Signed)
Subjective:    Patient ID: [name], female    DOB: 12/18/1928  MRN: [id number]   Brief patient profile:  89  yobf quit smoking 1987 with obesity/ aodm with multiple chronic complaints and documented rhinitis with inconsistent compliance with nasal steroids    History of Present Illness  May 20, 2010 ov c/o waking up with HA almost every am. She states that her back/ left positional shoulder pain is the same- no better or worse, but is resolved with aleve. She c/o dry cough at night x 1-2 wks. bp too high rec double dose of diovan   July 13, 2010 HA and dizziness. She also c/o ringing in both ears x several days. really bad 11/5 better now and hears ok.. no room spinning but dizzy, has not tried meclizine. no ha, ataxia, nausea.   04/20/2011 f/u ov/Wert cc cpx/ leg cramping once or twice a week x months. No pain walking, no numbness.  Occ dizzy but not orthostatic related and goes away before she has time to try her meclizine prn rec Try quinine in the form of tonic water at bedtime as needed for cramps See calendar for specific medication instructions   07/20/2011 f/u ov/Wert cc hurting all over every night when lie down with stiffness in am indolent onset, progressively worse not much better with aleve but only takes one a day.  No cough or sob. No overt symptoms of hyper or hypoglycemia.  Recurrent L ant cp x years, worse in sitting position, assoc with constipation.  >>rx trazadone.   07/27/2011 Follow up and Med review Pt returns for follow up and med review. Last visit , trouble with sleeping, Trazadone was added. Pt says she is sleeping much better. Does complain over last 5 days , cough and congestion have started. Complains of head congestion, prod cough with small amounts of yellow mucus x1week .  No hemoptysis . Does have some throat drainage and dry cough at night. We discussed her chlor tabs for this-she has not started this yet. Using mucinex without much help.   We reviewed all her meds and organized them into her med calendar with update, and pt education  Labs done last ov were essentially unremarkable except for A1C was unchanged at 8.4 - we discussed diet.  rec Zpack Take as directed.  Follow med calendar closely and bring to each visit.    10/24/2011 ov Wert/ f/u multiple chronic problems fasting for f/u lost the new med calendar provided but c/o chest pains went away " awhile ago" not reproducible with exertions.  HA no better, worse with anxiety not in am's and no assoc neuro cos, napping in afternoons sleeping poorly on trazadone 25. rec Please remember to go to the lab > all labs improving including hgba1c down from 8.4 to 7.6 Increase trazadone to 50 mg one at bedtime and no napping during the day until sleeping better at night See calendar for specific medication instructions      01/23/2012 f/u ov/Wert f/u hbp/ dm/hyperlipidemia/ insomnia cc sleeping better on melatonin, more active during the day and no high or low sugar symtoms, no tia or claudication or sob though she's relatively sedentary.  Following meds on calendar well, only stopped the trazadone because found it didn't sleep as well as melatonin.  Only using from prn list occ aleve. rec Follow med calendar   04/23/2012 f/u ov/Wert cc multiple active medical problems dm/ hbp / hyperlipidemia. Main complaint is variable R back pain x  3 months previously injected but don't remember by whom, no radicular symptoms. Has not tried nsaids though they are already on list for prn. No localized numbness/ weakness/ no pain with cough.  Overall worse with walking, better supine     >>aleve, fasting labs , tspine -DJD changes   06/04/2012 Follow up and med review   We reviewed all her meds and organized them into her med calendar with update, and pt education  Appears she is taking meds correctly and consistently.  CPX last ov showed DM not as well controlled with  A1C up 8.5 from 7.6  We  discussed diet and need to increased Amaryl. No low sugars on current regimen.  B/P has been running up 140-160.  She has been taking Aleve on regular basis for joint pains.  We discussed stopping NSAIDS and using tylenol arthritis and vicodin for severe pain.  Renal fxn was worse with scr at 1.3  No cp, dyspnea or edema.  >>Stop NSAIDS , Increase Glimepiride 4mg  daily  07/02/2012 Follow up  Patient returns for a 4 week followup for hypertension and diabetes. Last visit blood pressure was elevated. She was recommended to stop all NSAIDs Blood sugars were also elevated. She was instructed to increase glimepiride 4 mg daily Blood sugars have been averaging around 100-150 fasting, she has had no hypoglycemia episodes. Blood pressures are averaging 749 systolic She denies any chest pain, orthopnea, PND, or leg swelling rec Change Diovan 320/12.5mg  daily  Avoid NSAIDS -advil, excederin -they can raise blood pressure and affect the kidneys.  Check sugars 3 days a week ,  call if <80 or >200 Check blood pressures 3 days a week -call if >160    01/10/2013 f/u ov/Wert multiple chronic issues hbp, dm, arthritis Chief Complaint  Patient presents with  . Acute Visit    Pt c/o fatigue, nausea and "feeling jittery" x 2 wks.   No relation to meals or activity, no cp or ha or sob, symptoms come and go day > nightly >> no change rx  01/30/13 Follow up and med review  W except taking 1/2 tab of statin and suppose to a whole tabe reviewed all her meds and organized them into her med calendar with update, and pt education  Appears she is taking meds correctly,.  Complains of head congestion, runny nose, wheezing qhs x2 weeks d/t allergies. Does have thick mucus at times.  No fever , chest pain or edema.  Labs from last ov with A1C tr up at 8.7 and LDL 140  We discussed DM diet , sugars .  Discussed if not improving will need to consider Insulin therapy at next ov +/- Endocrine referral.  rec Add  Amlodipine 5mg  daily  Low salt diet  Low sweet diet  Zpack Take as directed.  Stop Mucinex D.  Avoid NSAIDS -aleve, advil, etc Avoid Decongestants -sudafed meds.  Restart Metformin 500mg  Twice daily   Saline nasal rinses As needed  Nasal congestion .  Use Nasonex 2 puffs Twice daily  Until sample is gone.  Follow med calendar closely and bring to each visit.  We will recheck Diabetes lab at next office visit , if not improved will need to start on Insulin .  Follow up [place] in 3 months with labs and As needed   Increase Simvastatin 40mg  At bedtime  03/19/2013 f/u ov/Wert re multiple complaints Chief Complaint  Patient presents with  . Follow-up    Pt c/o runny nose, nasal congestion and  dry cough for the past several wks. She also stiffness in her neck for the past wk.   nasal congestion bilaterally difficulty breathing through nose since mid May 2014,  Some yellow mucus, difficulty breathing nose is what's keeping her up, assoc with hacking cough and generalized chest and trunk discomfort worse with coughing.  No sinus pain, nasal discharge is beige to watery. Has not taken meds on action plan part of calendar for symptoms addressed on the list from previous visits >>augmentin rx    04/02/2013 Follow up and med review  We reviewed all her meds and organized them into her med calendar with update, and pt education  Appears she is taking meds correctly, Patient was seen 2 weeks ago and felt to have a sinus infection. She was given Augmentin but unfortunately was only able to take it for 4 days. Due to severe diarrhea. Patient reports her sinus condition did improve however, never totally went away, and is starting to increase with sinus pain pressure.  Continues to complain of some intermittent neck pain. Pain medicines, to help. Patient was simvastatin was stopped for possible relation to, muscle pain. However, she did not see any change off of her simvastatin for the last 2 week Chest  x-ray done last visit was normal Patient denies any hemoptysis, chest pain, orthopnea, PND, fever, extremity weakness, visual or speech changes, or difficulty swallowing. >>omnicef and pred pack  04/19/2013 Follow Up  Patient returns for a two-week followup Hasn't had a slow to resolve, sinusitis, and asthmatic bronchitis. She was treated with Omnicef for 10 days and a prednisone taper. Patient reports that she is much improved with decreased cough and congestion. Patient also is having some cervical neck pain felt to be musculoskeletal. This is also improved with prednisone.    06/19/2013 f/u ov/Wert re: arthritis/ hbp/ depression Chief Complaint  Patient presents with  . Annual Exam    Pt states doing well and denies any co's today.     >>labs   07/29/2013 Follow up  Patient returns for one-month followup, and medication review. We reviewed all her medications and organized them into a medication calendar. Patient was seen last visit for a physical. Labs showed her diabetes and, hyperlipidemia. We're not at goal. A1c had increased to 8.9 and her LDL was at 190. Patient explains that she had recently had a slow to resolve bronchitis, and was treated with antibiotics, and prednisone. Patient says it took her blood sugars antibiotic, it down after this. She also held her cholesterol medicine while she was on antibiotics, and prednisone and had just restarted them. Prior to her lab work. Patient denies any polyuria, polydipsia, chest pain, orthopnea, PND, or leg swelling. We discussed in detail a healthy, diabetic diet, along with low-cholesterol choices. Patient has agreed that if her diabetes is not under better control that we will refer her to an endocrinologist for further evaluation  and treatment options. >restarted statin   10/28/2013 Follow up  Returns for follow up for DM and Hyperlipidemia.   Complains that her shoulder and back are acting up. More stiffness and pain on/off over last  6 months.  She denies any known injury. She denies any extremity weakness, radicular symptoms. No rash. She has not taken any medications for treatment. We discussed using ice and heat.   FBS running 148-190. Patient says that she has tried eat somewhat better. However, does not do as well. Every day as she should. Patient denies any polyuria, polydipsia, chest  pain, shortness of breath, abdominal pain, nausea, vomiting, or edema. Patient is taking her simvastatin as directed. She is fasting for labs. Today She is overdue for her mammogram and has agreed to go for this in the next couple weeks.  01/29/2014 Follow up  Returns for follow up for DM and Hyperlipidemia.  Says BS been okay at home  100 - 140 .A1c was improved last time 8.9>8.0.  Labs showed kidney fxn w/ scr not as good at 1.3, advised to avoid NSAIDs.  Did not go for mammogram as discussed , plans to go next month .  Does request ortho referral as back seems to more painful and aches on/off . Has not taken tylenol or tramadol as previously discussed . No leg weakness but does complain of leg burning at times bilaterally.  No radicular symptoms. Says she seen by ortho years ago with steroid injection that helped her greatly.  She denies any chest pain, palpitations, presyncopal or syncopal episodes, nausea, vomiting, diarrhea, visual or speech changes, extremity weakness.        Current Medications, Allergies, Complete Past Medical History, Past Surgical History, Family History, and Social History were reviewed in [employer] record.  ROS  The following are not active complaints unless bolded sore throat, dysphagia, dental problems, itching, sneezing,  nasal congestion or excess/ purulent secretions, ear ache,   fever, chills, sweats, unintended wt loss, pleuritic or exertional cp, hemoptysis,  orthopnea pnd or leg swelling, presyncope, palpitations, heartburn, abdominal pain, anorexia, nausea, vomiting,  diarrhea  or change in bowel or urinary habits, change in stools or urine, dysuria,hematuria,  rash, arthralgias, visual complaints, headache, numbness weakness or ataxia or problems with walking or coordination,  change in mood/affect or memory.          Past Medical History:  HYPERTENSION (ICD-401.9)  HYPERLIPIDEMIA (ICD-272.4)  - target --LDL <70 (DM) due to DM  AODM  CHRONIC COUGH  - onset 1990's, almost daly since> resolved May 04, 2009 with ppi two times a day and chlortrimeton  - denied improved on ppi so try off effective August 19, 2009 > worse cough and constipation > restarted  CHRONIC RHINITIS (ICD-472.0)-non adherent with nasal steroids  -Ct sinus nml (08/1999, 01/2005)  Hx of URTICARIA (ICD-708.9)  LUMBAR RADICULOPATHY, RIGHT (ICD-724.4)  DEPRESSION (ICD-311)   MORBID OBESITY (ICD-278.01)  - Target wt = 158 for BMI < 30  VIT D DEFICIENCY dx 08/28/08  - Rx with 50 k biw x 12weeks 09/01/08 > recheck 04/20/2011  Osteopenia  -BMD 04/2000 -nml (-0.4 hip)  -BMD 09/15/08 T spine 3.3, L Fem -1.0, R Fem - 1.0  HEALTH MAINENANCE.......................................................................................Marland KitchenWert  -Td 09/2004 -Pneumovax (1998), 05/02/2008  -Mammogram (9/08)-nml , >06/04/2012 >> - Colonsocopy nl 05/2008............................................................................Marland KitchenDelfin Edis  -GYN......................................................................................................... Matthew Saras   -CPX  06/19/2013  -refused flu shot August 18, 2009 and May 20, 2010 , 06/04/2012  COMPLEX MED REGIMEN  -- med calendar completed/adjusted reviewed November 18, 2009 but not following > using it May 20, 2010 , 07/27/2011 , 06/04/2012 .04/02/2013       Objective:   Physical Exam  wt   155 Jan 25, 2010 > Wt 153 04/20/2011 >  10/24/2011 152 > 01/23/2012 151> 04/23/2012  149>152 06/04/2012 >  147 01/10/2013 >151 02/01/2013 >  148> 147 04/02/2013 >145  04/19/2013 > 06/19/2013 148 >148 10/28/2013 >150 01/29/2014  amb bf nad with nl vital signs  HEENT: edentulous with dentures in place, nl turbinates, and orophanx. Nl external ear canals without cough  reflex  Max sinus tendeness  NECK : without JVD/Nodes/TM/ nl carotid upstrokes bilaterally  LUNGS:  no acc muscle use, clear to A and P bilaterally without cough on insp or exp maneuvers  CV: RRR no s3 or murmur or increase in P2, no edema .  Pulses sym bilaterally in feet ABD: soft and nontender with nl excursion in the supine position. No bruits or organomegaly, bowel sounds nl  MS: warm without deformities, calf tenderness, cyanosis or clubbing. Neck full rom, nml grips and equal strength, Neg SLR, no point tenderness along low back /spine. No deformity noted. Equal strength of LE.   Neuro alert, no motor, cerebellar deficits, nl gait.  nml sensation of LE extremities , equal ,  Skin: no lesions     CXR  06/19/2013 :  Stable chest x-ray. Borderline cardiomegaly. No active lung disease.           Assessment & Plan:

## 2014-01-29 NOTE — Addendum Note (Signed)
Addended by: Parke Poisson E on: 01/29/2014 01:06 PM   Modules accepted: Orders

## 2014-01-29 NOTE — Assessment & Plan Note (Signed)
Controlled on current regimen Plan Continue low salt diet Continue on current regimen

## 2014-01-29 NOTE — Assessment & Plan Note (Signed)
Improved control Advised on low sweet Plan Labs today. Continue on current regimen

## 2014-01-30 ENCOUNTER — Encounter: Payer: Self-pay | Admitting: Adult Health

## 2014-01-31 ENCOUNTER — Other Ambulatory Visit: Payer: Self-pay | Admitting: Adult Health

## 2014-01-31 DIAGNOSIS — E119 Type 2 diabetes mellitus without complications: Secondary | ICD-10-CM

## 2014-01-31 NOTE — Progress Notes (Signed)
Quick Note:  Called spoke with patient, advised of lab results / recs as stated by TP. Pt verbalized her understanding and denied any questions. Orders only encounter created for Endocrinology referral. ______

## 2014-01-31 NOTE — Progress Notes (Signed)
Referral placed to Endrocrinology  Result Notes    Notes Recorded by Rinaldo Ratel, CMA on 01/31/2014 at 9:38 AM Called spoke with patient, advised of lab results / recs as stated by TP. Pt verbalized her understanding and denied any questions. Orders only encounter created for Endocrinology referral. ------  Notes Recorded by Melvenia Needles, NP on 01/30/2014 at 2:28 PM Kidney fxn is the same  Cont w/ ov recs  DM control is not as good  Need to refer to endocrinologist for DM management.

## 2014-02-03 ENCOUNTER — Telehealth: Payer: Self-pay | Admitting: Internal Medicine

## 2014-02-03 NOTE — Telephone Encounter (Signed)
Spoke to pt she is aware her appt is to see [email or web address] 02/25/14@2pm  [name]

## 2014-02-03 NOTE — Telephone Encounter (Signed)
Spoke with the pt  She states that she is needing appt info for ortho  She received a packet in the mail, but it has no appt date and she is confused  Will forward to [facility] to advise thanks

## 2014-02-04 ENCOUNTER — Ambulatory Visit (INDEPENDENT_AMBULATORY_CARE_PROVIDER_SITE_OTHER): Payer: Medicare Other | Admitting: Endocrinology

## 2014-02-04 ENCOUNTER — Encounter: Payer: Self-pay | Admitting: Endocrinology

## 2014-02-04 VITALS — BP 120/62 | HR 89 | Temp 97.7°F | Wt 147.0 lb

## 2014-02-04 DIAGNOSIS — E119 Type 2 diabetes mellitus without complications: Secondary | ICD-10-CM | POA: Diagnosis not present

## 2014-02-04 MED ORDER — INSULIN DETEMIR 100 UNIT/ML FLEXPEN
10.0000 [IU] | PEN_INJECTOR | SUBCUTANEOUS | Status: DC
Start: 2014-02-04 — End: 2014-04-29

## 2014-02-04 NOTE — Patient Instructions (Addendum)
good diet and exercise habits significanly improve the control of your diabetes.  please let me know if you wish to be referred to a dietician.  high blood sugar is very risky to your health.  you should see an eye doctor and dentist every year.  You are at higher than average risk for pneumonia and hepatitis-B.  You should be vaccinated against both.   controlling your blood pressure and cholesterol drastically reduces the damage diabetes does to your body.  this also applies to quitting smoking.  please discuss these with your doctor.  check your blood sugar once a day.  vary the time of day when you check, between before the 3 meals, and at bedtime.  also check if you have symptoms of your blood sugar being too high or too low.  please keep a record of the readings and bring it to your next appointment here.  You can write it on any piece of paper.  please call us sooner if your blood sugar goes below 70, or if you have a lot of readings over 200.  i have sent a prescription to your pharmacy, to start a small amount of insulin.  Please call if this is too expensive, so we can change to a bottle. Please come back for a follow-up appointment next week.   Please continue the diabetes pills for now.  However, we'll plan to stop these with time.

## 2014-02-04 NOTE — Progress Notes (Signed)
Subjective:    Patient ID: [name], female    DOB: 1928/10/06, 80 y.o.   MRN: [id number]  HPI pt states DM was dx'ed in 1985; she has mild neuropathy of the lower extremities; she is unaware of any associated chronic complications.  she has never been on insulin.  pt says her diet is good, but exercise is limited by health problems.  she has never had GDM, pancreatitis, severe hypoglycemia or DKA.  she brings a record of her cbg's which i have reviewed today, all checked in am.   Past Medical History  Diagnosis Date  . Hyperlipidemia   . Hypertension     No past surgical history on file.  History   Social History  . Marital Status: Single    Spouse Name: N/A    Number of Children: N/A  . Years of Education: N/A   Occupational History  . Not on file.   Social History Main Topics  . Smoking status: Former Smoker -- 0.30 packs/day for 20 years    Types: Cigarettes    Quit date: 09/05/1985  . Smokeless tobacco: Never Used  . Alcohol Use: Not on file  . Drug Use: Not on file  . Sexual Activity: Not on file   Other Topics Concern  . Not on file   Social History Narrative  . No narrative on file    Current Outpatient Prescriptions on File Prior to Visit  Medication Sig Dispense Refill  . acetaminophen (TYLENOL) 500 MG tablet As directed with food when needed for arthritis pain/headache/back pain      . amLODipine (NORVASC) 5 MG tablet Take 1 tablet (5 mg total) by mouth daily.  90 tablet  3  . aspirin 81 MG tablet Take 81 mg by mouth every morning.       . Calcium Carbonate-Vitamin D (CALCIUM 600+D) 600-400 MG-UNIT per tablet Take 1 tablet by mouth daily.      . chlorpheniramine (CHLOR-TRIMETON) 4 MG tablet Take 4 mg by mouth every 6 (six) hours as needed (drippy nose/throat drainage).       . Cholecalciferol (VITAMIN D-3) 1000 UNITS CAPS Take 1 capsule by mouth daily.      Marland Kitchen dextromethorphan-guaiFENesin (MUCINEX DM) 30-600 MG per 12 hr tablet Take 1-2 tablets by  mouth every 12 (twelve) hours as needed.      . famotidine (PEPCID) 20 MG tablet Take 20 mg by mouth at bedtime.      . fluticasone (FLONASE) 50 MCG/ACT nasal spray Place 2 sprays into both nostrils daily.  16 g  2  . glimepiride (AMARYL) 4 MG tablet TAKE 1 TABLET (4 MG TOTAL) BY MOUTH DAILY WITH LUNCH.  90 tablet  3  . glucose blood (PRODIGY TEST) test strip Ck blood sugars once daily or as instructed  50 each  12  . HYDROcodone-acetaminophen (NORCO) 5-325 MG per tablet Take 1 tablet by mouth every 6 (six) hours as needed for pain.  30 tablet  0  . loperamide (IMODIUM A-D) 2 MG tablet Per box as needed for diarrhea      . Magnesium 250 MG TABS Take 1 tablet by mouth daily.      . magnesium hydroxide (MILK OF MAGNESIA) 400 MG/5ML suspension Per bottle as needed for constipation      . meloxicam (MOBIC) 15 MG tablet Take 1 tablet (15 mg total) by mouth daily.  7 tablet  0  . metFORMIN (GLUCOPHAGE) 500 MG tablet TAKE 1 TABLET (500 MG  TOTAL) BY MOUTH 2 (TWO) TIMES DAILY WITH A MEAL.  60 tablet  5  . Multiple Vitamins-Minerals (CENTRUM SILVER PO) Take 1 tablet by mouth daily.        Marland Kitchen omeprazole (PRILOSEC) 20 MG capsule Take 20 mg by mouth daily. Before first meal      . sertraline (ZOLOFT) 100 MG tablet TAKE 1 TABLET (100 MG TOTAL) BY MOUTH DAILY.  90 tablet  3  . simethicone (GAS-X) 80 MG chewable tablet Take with meals as needed      . simvastatin (ZOCOR) 40 MG tablet Take 1 tablet (40 mg total) by mouth at bedtime.  90 tablet  3  . sitaGLIPtin (JANUVIA) 100 MG tablet TAKE 1 TABLET BY MOUTH EVERY DAY  90 tablet  3  . Sodium Chloride-Sodium Bicarb (AYR SALINE NASAL RINSE NA) Place into the nose. As needed      . traMADol (ULTRAM) 50 MG tablet TAKE 1 TO 2 TABLETS BY MOUTH EVERY 4 HOURS AS NEEDED FOR COUGH AND PAIN  40 tablet  2  . valsartan-hydrochlorothiazide (DIOVAN HCT) 160-12.5 MG per tablet Take 1 tablet by mouth 2 (two) times daily.  180 tablet  3   No current facility-administered  medications on file prior to visit.    Allergies  Allergen Reactions  . Albuterol   . Doxycycline Nausea And Vomiting    Family History  Problem Relation Age of Onset  . Diabetes Mother   . Diabetes Sister     BP 120/62  Pulse 89  Temp(Src) 97.7 F (36.5 C) (Oral)  Wt 147 lb (66.679 kg)  SpO2 98%  Review of Systems denies weight loss, blurry vision, chest pain, sob, n/v, urinary frequency, excessive diaphoresis, memory loss, depression, cold intolerance, rhinorrhea, and easy bruising. She has mild headaches, depression, and leg cramps.      Objective:   Physical Exam VS: see vs page GEN: no distress HEAD: head: no deformity eyes: no periorbital swelling, no proptosis external nose and ears are normal mouth: no lesion seen NECK: supple, thyroid is not enlarged CHEST WALL: no deformity LUNGS:  Clear to auscultation CV: reg rate and rhythm, no murmur ABD: abdomen is soft, nontender.  no hepatosplenomegaly.  not distended.  no hernia MUSCULOSKELETAL: muscle bulk and strength are grossly normal.  no obvious joint swelling.  gait is normal and steady EXTEMITIES: no deformity.  no ulcer on the feet.  feet are of normal color and temp.  no edema PULSES: dorsalis pedis intact bilat.  no carotid bruit NEURO:  cn 2-12 grossly intact.   readily moves all 4's.  sensation is intact to touch on the feet SKIN:  Normal texture and temperature.  No rash or suspicious lesion is visible.   NODES:  None palpable at the neck PSYCH: alert, well-oriented.  Does not appear anxious nor depressed.    Lab Results  Component Value Date   HGBA1C 9.3* 01/29/2014      Assessment & Plan:  DM: severe exacerbation: she needs insulin Renal insufficiency, new to me: this limits oral DM options.  This impairs the ability to achieve glycemic control.  I'll work around this as best I can.     Patient is advised the following: Patient Instructions  good diet and exercise habits significanly improve  the control of your diabetes.  please let me know if you wish to be referred to a dietician.  high blood sugar is very risky to your health.  you should see an eye doctor  and dentist every year.  You are at higher than average risk for pneumonia and hepatitis-B.  You should be vaccinated against both.   controlling your blood pressure and cholesterol drastically reduces the damage diabetes does to your body.  this also applies to quitting smoking.  please discuss these with your doctor.  check your blood sugar once a day.  vary the time of day when you check, between before the 3 meals, and at bedtime.  also check if you have symptoms of your blood sugar being too high or too low.  please keep a record of the readings and bring it to your next appointment here.  You can write it on any piece of paper.  please call us sooner if your blood sugar goes below 70, or if you have a lot of readings over 200.  i have sent a prescription to your pharmacy, to start a small amount of insulin.  Please call if this is too expensive, so we can change to a bottle. Please come back for a follow-up appointment next week.   Please continue the diabetes pills for now.  However, we'll plan to stop these with time.

## 2014-02-05 ENCOUNTER — Telehealth: Payer: Self-pay | Admitting: *Deleted

## 2014-02-05 NOTE — Telephone Encounter (Signed)
[name] wanted her to call she wants to stay on the insulin he prescribe do not want to change

## 2014-02-06 NOTE — Telephone Encounter (Signed)
See below and please advise. Pt does not want to change meds.  Thanks!

## 2014-02-11 ENCOUNTER — Encounter: Payer: Self-pay | Admitting: Endocrinology

## 2014-02-11 ENCOUNTER — Ambulatory Visit (INDEPENDENT_AMBULATORY_CARE_PROVIDER_SITE_OTHER): Payer: Medicare Other | Admitting: Endocrinology

## 2014-02-11 VITALS — BP 130/70 | HR 82 | Temp 98.3°F | Ht 62.0 in | Wt 146.0 lb

## 2014-02-11 DIAGNOSIS — E1049 Type 1 diabetes mellitus with other diabetic neurological complication: Secondary | ICD-10-CM | POA: Diagnosis not present

## 2014-02-11 NOTE — Patient Instructions (Addendum)
check your blood sugar once a day.  vary the time of day when you check, between before the 3 meals, and at bedtime.  also check if you have symptoms of your blood sugar being too high or too low.  please keep a record of the readings and bring it to your next appointment here.  You can write it on any piece of paper.  please call us sooner if your blood sugar goes below 70, or if you have a lot of readings over 200.  i have sent a prescription to your pharmacy, to start a small amount of insulin.  Please call if this is too expensive, so we can change to a bottle. Please come back for a follow-up appointment in 1 month.  Please stop the metformin and januvia.  Please continue the same insulin for now.  However, please call if the blood sugar goes up, so we increase the insulin.

## 2014-02-11 NOTE — Progress Notes (Signed)
Subjective:    Patient ID: [name], female    DOB: 1928-11-15, 80 y.o.   MRN: [id number]  HPI pt returns for f/u of insulin-requiring DM (dx'ed in 1985; she has mild neuropathy of the lower extremities; she is unaware of any associated chronic complications; she started taking insulin last week; she has never had GDM, pancreatitis, severe hypoglycemia or DKA).  she brings a record of her cbg's which i have reviewed today.  It varies from 83-200's.  It is in general higher as the day goes on, but not necessarily so.  She wants to take insulin only qd.   Past Medical History  Diagnosis Date  . Hyperlipidemia   . Hypertension     No past surgical history on file.  History   Social History  . Marital Status: Single    Spouse Name: N/A    Number of Children: N/A  . Years of Education: N/A   Occupational History  . Not on file.   Social History Main Topics  . Smoking status: Former Smoker -- 0.30 packs/day for 20 years    Types: Cigarettes    Quit date: 09/05/1985  . Smokeless tobacco: Never Used  . Alcohol Use: Not on file  . Drug Use: Not on file  . Sexual Activity: Not on file   Other Topics Concern  . Not on file   Social History Narrative  . No narrative on file    Current Outpatient Prescriptions on File Prior to Visit  Medication Sig Dispense Refill  . acetaminophen (TYLENOL) 500 MG tablet As directed with food when needed for arthritis pain/headache/back pain      . amLODipine (NORVASC) 5 MG tablet Take 1 tablet (5 mg total) by mouth daily.  90 tablet  3  . aspirin 81 MG tablet Take 81 mg by mouth every morning.       . Calcium Carbonate-Vitamin D (CALCIUM 600+D) 600-400 MG-UNIT per tablet Take 1 tablet by mouth daily.      . chlorpheniramine (CHLOR-TRIMETON) 4 MG tablet Take 4 mg by mouth every 6 (six) hours as needed (drippy nose/throat drainage).       . Cholecalciferol (VITAMIN D-3) 1000 UNITS CAPS Take 1 capsule by mouth daily.      Marland Kitchen  dextromethorphan-guaiFENesin (MUCINEX DM) 30-600 MG per 12 hr tablet Take 1-2 tablets by mouth every 12 (twelve) hours as needed.      . famotidine (PEPCID) 20 MG tablet Take 20 mg by mouth at bedtime.      . fluticasone (FLONASE) 50 MCG/ACT nasal spray Place 2 sprays into both nostrils daily.  16 g  2  . glimepiride (AMARYL) 4 MG tablet TAKE 1 TABLET (4 MG TOTAL) BY MOUTH DAILY WITH LUNCH.  90 tablet  3  . glucose blood (PRODIGY TEST) test strip Ck blood sugars once daily or as instructed  50 each  12  . HYDROcodone-acetaminophen (NORCO) 5-325 MG per tablet Take 1 tablet by mouth every 6 (six) hours as needed for pain.  30 tablet  0  . Insulin Detemir (LEVEMIR FLEXTOUCH) 100 UNIT/ML Pen Inject 10 Units into the skin every morning. And pen needles 1/day  15 mL  11  . loperamide (IMODIUM A-D) 2 MG tablet Per box as needed for diarrhea      . Magnesium 250 MG TABS Take 1 tablet by mouth daily.      . magnesium hydroxide (MILK OF MAGNESIA) 400 MG/5ML suspension Per bottle as needed for constipation      .  meloxicam (MOBIC) 15 MG tablet Take 1 tablet (15 mg total) by mouth daily.  7 tablet  0  . Multiple Vitamins-Minerals (CENTRUM SILVER PO) Take 1 tablet by mouth daily.        Marland Kitchen omeprazole (PRILOSEC) 20 MG capsule Take 20 mg by mouth daily. Before first meal      . sertraline (ZOLOFT) 100 MG tablet TAKE 1 TABLET (100 MG TOTAL) BY MOUTH DAILY.  90 tablet  3  . simethicone (GAS-X) 80 MG chewable tablet Take with meals as needed      . simvastatin (ZOCOR) 40 MG tablet Take 1 tablet (40 mg total) by mouth at bedtime.  90 tablet  3  . Sodium Chloride-Sodium Bicarb (AYR SALINE NASAL RINSE NA) Place into the nose. As needed      . traMADol (ULTRAM) 50 MG tablet TAKE 1 TO 2 TABLETS BY MOUTH EVERY 4 HOURS AS NEEDED FOR COUGH AND PAIN  40 tablet  2  . valsartan-hydrochlorothiazide (DIOVAN HCT) 160-12.5 MG per tablet Take 1 tablet by mouth 2 (two) times daily.  180 tablet  3   No current facility-administered  medications on file prior to visit.    Allergies  Allergen Reactions  . Albuterol   . Doxycycline Nausea And Vomiting    Family History  Problem Relation Age of Onset  . Diabetes Mother   . Diabetes Sister     BP 130/70  Pulse 82  Temp(Src) 98.3 F (36.8 C) (Oral)  Ht 5\' 2"  (1.575 m)  Wt 146 lb (66.225 kg)  BMI 26.70 kg/m2  SpO2 98%  Review of Systems She denies n/v/weight change    Objective:   Physical Exam VITAL SIGNS:  See vs page GENERAL: no distress SKIN:  Insulin injection sites at the anterior abdomen are normal.   Assessment & Plan:  DM: overcontrolled.  She is ready to transition to just insulin. Renal insufficiency.  This prolongs the duration of action of insulin.  This impairs the ability to achieve glycemic control.  I'll work around this as best I can. Morbid obesity: This impairs the ability to achieve glycemic control.  I'll work around this as best I can.       Patient is advised the following: Patient Instructions  check your blood sugar once a day.  vary the time of day when you check, between before the 3 meals, and at bedtime.  also check if you have symptoms of your blood sugar being too high or too low.  please keep a record of the readings and bring it to your next appointment here.  You can write it on any piece of paper.  please call us sooner if your blood sugar goes below 70, or if you have a lot of readings over 200.  i have sent a prescription to your pharmacy, to start a small amount of insulin.  Please call if this is too expensive, so we can change to a bottle. Please come back for a follow-up appointment in 1 month.  Please stop the metformin and januvia.  Please continue the same insulin for now.  However, please call if the blood sugar goes up, so we increase the insulin.

## 2014-02-13 DIAGNOSIS — E1049 Type 1 diabetes mellitus with other diabetic neurological complication: Secondary | ICD-10-CM | POA: Insufficient documentation

## 2014-02-18 ENCOUNTER — Telehealth: Payer: Self-pay | Admitting: Endocrinology

## 2014-02-18 NOTE — Telephone Encounter (Signed)
Pt called stating that her blood sugar has been going up. She states that Sunday it was 200 in the am. Monday 9:10 am, 185, bedtime over 200. This am, first thing it was 130. It was 189 at 9:00 am and 217 at 12:15 pm. Please advise in Dr Cordelia Pen absence.

## 2014-02-18 NOTE — Telephone Encounter (Signed)
Patient states that Dr. Loanne Drilling changed her insulin She can not get her sugar to come down   Please advise patient   Thank you:)

## 2014-02-18 NOTE — Telephone Encounter (Signed)
Called pt >> advised her to restart Januvia in am for now to see if sugars improve.

## 2014-02-25 ENCOUNTER — Telehealth: Payer: Self-pay | Admitting: Endocrinology

## 2014-02-25 DIAGNOSIS — M545 Low back pain, unspecified: Secondary | ICD-10-CM | POA: Diagnosis not present

## 2014-02-25 DIAGNOSIS — M19019 Primary osteoarthritis, unspecified shoulder: Secondary | ICD-10-CM | POA: Diagnosis not present

## 2014-02-25 DIAGNOSIS — M5137 Other intervertebral disc degeneration, lumbosacral region: Secondary | ICD-10-CM | POA: Diagnosis not present

## 2014-02-25 DIAGNOSIS — M25519 Pain in unspecified shoulder: Secondary | ICD-10-CM | POA: Diagnosis not present

## 2014-02-25 NOTE — Telephone Encounter (Signed)
Patient states her insulin is not working and would like to speak with Dr. Rosario Adie nurse   Thank Dennis Bast

## 2014-02-25 NOTE — Telephone Encounter (Signed)
Called pt. She states that he sugar has been ranging from 150's to high 200's. Pt confirms that she is taking 10 units of Levemir. Wanted to know what to do since her sugars have not been coming down.  Please advise, Thanks!

## 2014-02-26 NOTE — Telephone Encounter (Signed)
Please increase levemir to 20 units each morning.

## 2014-02-27 NOTE — Telephone Encounter (Signed)
Noted, pt advised

## 2014-03-03 ENCOUNTER — Other Ambulatory Visit: Payer: Self-pay | Admitting: *Deleted

## 2014-03-03 DIAGNOSIS — R0989 Other specified symptoms and signs involving the circulatory and respiratory systems: Secondary | ICD-10-CM

## 2014-03-03 DIAGNOSIS — I739 Peripheral vascular disease, unspecified: Secondary | ICD-10-CM

## 2014-03-04 ENCOUNTER — Ambulatory Visit (INDEPENDENT_AMBULATORY_CARE_PROVIDER_SITE_OTHER): Payer: Medicare Other | Admitting: Vascular Surgery

## 2014-03-04 ENCOUNTER — Ambulatory Visit (HOSPITAL_COMMUNITY)
Admission: RE | Admit: 2014-03-04 | Discharge: 2014-03-04 | Disposition: A | Discharge status: Home / Self Care | Payer: Medicare Other | Source: Ambulatory Visit | Attending: Vascular Surgery | Admitting: Vascular Surgery

## 2014-03-04 ENCOUNTER — Encounter: Payer: Self-pay | Admitting: Vascular Surgery

## 2014-03-04 VITALS — BP 148/81 | HR 73 | Temp 97.9°F | Resp 16 | Wt 145.0 lb

## 2014-03-04 DIAGNOSIS — R0989 Other specified symptoms and signs involving the circulatory and respiratory systems: Secondary | ICD-10-CM | POA: Diagnosis not present

## 2014-03-04 DIAGNOSIS — I739 Peripheral vascular disease, unspecified: Secondary | ICD-10-CM | POA: Diagnosis not present

## 2014-03-04 NOTE — Progress Notes (Signed)
Subjective:     Patient ID: [name], female   DOB: 1929/04/05, 80 y.o.   MRN: [id number]  HPI this 80 year old female was referred by Dr. Erasmo Score for evaluation of lower extremity occlusive disease. He was examining her for left shoulder problems and could not palpate pulses in the right lower extremity. Patient has no history of nonhealing ulcers, rest pain, severe claudication, or infection in the right foot. She does have diabetes mellitus-type I recently started on insulin. She is able to ambulate at least one block but is limited by her back pain in her left shoulder discomfort also bothers her.  Past Medical History  Diagnosis Date  . Hyperlipidemia   . Hypertension   . Varicose veins   . SOBOE (shortness of breath on exertion)   . Diabetes mellitus without complication     History  Substance Use Topics  . Smoking status: Former Smoker -- 0.30 packs/day for 20 years    Types: Cigarettes    Quit date: 09/05/1985  . Smokeless tobacco: Never Used  . Alcohol Use: No    Family History  Problem Relation Age of Onset  . Hypertension Mother   . Diabetes Mother   . Diabetes Sister   . Heart disease Sister   . Heart disease Father   . Diabetes Brother   . Diabetes Daughter   . Diabetes Son   . Diabetes Daughter   . Diabetes Son     Allergies  Allergen Reactions  . Albuterol   . Doxycycline Nausea And Vomiting    Current outpatient prescriptions:acetaminophen (TYLENOL) 500 MG tablet, As directed with food when needed for arthritis pain/headache/back pain, Disp: , Rfl: ;  amLODipine (NORVASC) 5 MG tablet, Take 1 tablet (5 mg total) by mouth daily., Disp: 90 tablet, Rfl: 3;  aspirin 81 MG tablet, Take 81 mg by mouth every morning. , Disp: , Rfl:  Calcium Carbonate-Vitamin D (CALCIUM 600+D) 600-400 MG-UNIT per tablet, Take 1 tablet by mouth daily., Disp: , Rfl: ;  chlorpheniramine (CHLOR-TRIMETON) 4 MG tablet, Take 4 mg by mouth every 6 (six) hours as needed (drippy  nose/throat drainage). , Disp: , Rfl: ;  Cholecalciferol (VITAMIN D-3) 1000 UNITS CAPS, Take 1 capsule by mouth daily., Disp: , Rfl:  dextromethorphan-guaiFENesin (MUCINEX DM) 30-600 MG per 12 hr tablet, Take 1-2 tablets by mouth every 12 (twelve) hours as needed., Disp: , Rfl: ;  famotidine (PEPCID) 20 MG tablet, Take 20 mg by mouth at bedtime., Disp: , Rfl: ;  fluticasone (FLONASE) 50 MCG/ACT nasal spray, Place 2 sprays into both nostrils daily., Disp: 16 g, Rfl: 2;  glimepiride (AMARYL) 4 MG tablet, TAKE 1 TABLET (4 MG TOTAL) BY MOUTH DAILY WITH LUNCH., Disp: 90 tablet, Rfl: 3 glucose blood (PRODIGY TEST) test strip, Ck blood sugars once daily or as instructed, Disp: 50 each, Rfl: 12;  HYDROcodone-acetaminophen (NORCO) 5-325 MG per tablet, Take 1 tablet by mouth every 6 (six) hours as needed for pain., Disp: 30 tablet, Rfl: 0;  Insulin Detemir (LEVEMIR FLEXTOUCH) 100 UNIT/ML Pen, Inject 10 Units into the skin every morning. And pen needles 1/day, Disp: 15 mL, Rfl: 11 loperamide (IMODIUM A-D) 2 MG tablet, Per box as needed for diarrhea, Disp: , Rfl: ;  Magnesium 250 MG TABS, Take 1 tablet by mouth daily., Disp: , Rfl: ;  magnesium hydroxide (MILK OF MAGNESIA) 400 MG/5ML suspension, Per bottle as needed for constipation, Disp: , Rfl: ;  meloxicam (MOBIC) 15 MG tablet, Take 1 tablet (15  mg total) by mouth daily., Disp: 7 tablet, Rfl: 0 Multiple Vitamins-Minerals (CENTRUM SILVER PO), Take 1 tablet by mouth daily.  , Disp: , Rfl: ;  omeprazole (PRILOSEC) 20 MG capsule, Take 20 mg by mouth daily. Before first meal, Disp: , Rfl: ;  sertraline (ZOLOFT) 100 MG tablet, TAKE 1 TABLET (100 MG TOTAL) BY MOUTH DAILY., Disp: 90 tablet, Rfl: 3;  simethicone (GAS-X) 80 MG chewable tablet, Take with meals as needed, Disp: , Rfl:  simvastatin (ZOCOR) 40 MG tablet, Take 1 tablet (40 mg total) by mouth at bedtime., Disp: 90 tablet, Rfl: 3;  Sodium Chloride-Sodium Bicarb (AYR SALINE NASAL RINSE NA), Place into the nose. As  needed, Disp: , Rfl: ;  traMADol (ULTRAM) 50 MG tablet, TAKE 1 TO 2 TABLETS BY MOUTH EVERY 4 HOURS AS NEEDED FOR COUGH AND PAIN, Disp: 40 tablet, Rfl: 2 valsartan-hydrochlorothiazide (DIOVAN HCT) 160-12.5 MG per tablet, Take 1 tablet by mouth 2 (two) times daily., Disp: 180 tablet, Rfl: 3  BP 148/81  Pulse 73  Temp(Src) 97.9 F (36.6 C) (Oral)  Resp 16  Wt 145 lb (65.772 kg)  SpO2 98%  Body mass index is 26.51 kg/(m^2).          Review of Systems patient denies chest pain but does complain of dyspnea on exertion. She has weakness generally in the arms and legs and dizziness as well as left shoulder and back discomfort. No history coronary artery disease and myocardial infarction. Denies lateralizing weakness, aphasia, amaurosis fugax, diplopia, blurred vision, or syncope. Other systems negative and a complete review of systems    Objective:   Physical Exam BP 148/81  Pulse 73  Temp(Src) 97.9 F (36.6 C) (Oral)  Resp 16  Wt 145 lb (65.772 kg)  SpO2 98%  Gen.-alert and oriented x3 in no apparent distress HEENT normal for age Lungs no rhonchi or wheezing Cardiovascular regular rhythm no murmurs carotid pulses 3+ palpable no bruits audible Abdomen soft nontender no palpable masses Musculoskeletal free of  major deformities Skin clear -no rashes Neurologic normal Lower extremities 3+ femoral and 1-2+ dorsalis pedis and posterior tibial pulses palpable bilaterally with no edema-no active ulceration or evidence of ischemia noted.  Today I ordered lower extremity arterial Doppler exam which are reviewed and interpreted. She has triphasic flow in both lower extremities with ABIs which are normal-0.98 on the right and 1.02 on the left.        Assessment:     No evidence of significant arterial occlusive disease on physical exam or lower extremity arterial Doppler study-patient asymptomatic Type 1 diabetes mellitus    Plan:     Return to see me on a when necessary basis

## 2014-03-09 ENCOUNTER — Other Ambulatory Visit: Payer: Self-pay | Admitting: Internal Medicine

## 2014-03-14 ENCOUNTER — Encounter: Payer: Self-pay | Admitting: Endocrinology

## 2014-03-14 ENCOUNTER — Ambulatory Visit (INDEPENDENT_AMBULATORY_CARE_PROVIDER_SITE_OTHER): Payer: Medicare Other | Admitting: Endocrinology

## 2014-03-14 VITALS — BP 142/82 | HR 83 | Temp 98.0°F | Ht 62.0 in | Wt 146.0 lb

## 2014-03-14 DIAGNOSIS — E1049 Type 1 diabetes mellitus with other diabetic neurological complication: Secondary | ICD-10-CM | POA: Diagnosis not present

## 2014-03-14 NOTE — Patient Instructions (Addendum)
check your blood sugar once a day.  vary the time of day when you check, between before the 3 meals, and at bedtime.  also check if you have symptoms of your blood sugar being too high or too low.  please keep a record of the readings and bring it to your next appointment here.  You can write it on any piece of paper.  please call us sooner if your blood sugar goes below 70, or if you have a lot of readings over 200.  Please come back for a follow-up appointment in 2 months.   blood tests are being requested for you today.  We'll contact you with results.  If it is ok, i'll give you the go-ahead to have your shoulder surgery.

## 2014-03-14 NOTE — Progress Notes (Signed)
Subjective:    Patient ID: [name], female    DOB: [birth year], 80 y.o.   MRN: [id number]  HPI pt returns for f/u of insulin-requiring DM (dx'ed in 1985; she has mild neuropathy of the lower extremities; she has associated PAD; she started taking insulin in mid-2015; she agrees to take insulin only qd; she has never had GDM, pancreatitis, severe hypoglycemia or DKA).  she brings a record of her cbg's which i have reviewed today.  It varies from 114-128.  She checks only in am.  pt states she feels well in general. Past Medical History  Diagnosis Date  . Hyperlipidemia   . Hypertension   . Varicose veins   . SOBOE (shortness of breath on exertion)   . Diabetes mellitus without complication     Past Surgical History  Procedure Laterality Date  . Appendectomy  50 years ago    History   Social History  . Marital Status: Single    Spouse Name: N/A    Number of Children: N/A  . Years of Education: N/A   Occupational History  . Not on file.   Social History Main Topics  . Smoking status: Former Smoker -- 0.30 packs/day for 20 years    Types: Cigarettes    Quit date: 09/05/1985  . Smokeless tobacco: Never Used  . Alcohol Use: No  . Drug Use: No  . Sexual Activity: Not on file   Other Topics Concern  . Not on file   Social History Narrative  . No narrative on file    Current Outpatient Prescriptions on File Prior to Visit  Medication Sig Dispense Refill  . acetaminophen (TYLENOL) 500 MG tablet As directed with food when needed for arthritis pain/headache/back pain      . amLODipine (NORVASC) 5 MG tablet Take 1 tablet (5 mg total) by mouth daily.  90 tablet  3  . aspirin 81 MG tablet Take 81 mg by mouth every morning.       . Calcium Carbonate-Vitamin D (CALCIUM 600+D) 600-400 MG-UNIT per tablet Take 1 tablet by mouth daily.      . chlorpheniramine (CHLOR-TRIMETON) 4 MG tablet Take 4 mg by mouth every 6 (six) hours as needed (drippy nose/throat drainage).       .  Cholecalciferol (VITAMIN D-3) 1000 UNITS CAPS Take 1 capsule by mouth daily.      Marland Kitchen dextromethorphan-guaiFENesin (MUCINEX DM) 30-600 MG per 12 hr tablet Take 1-2 tablets by mouth every 12 (twelve) hours as needed.      . famotidine (PEPCID) 20 MG tablet Take 20 mg by mouth at bedtime.      . fluticasone (FLONASE) 50 MCG/ACT nasal spray Place 2 sprays into both nostrils daily.  16 g  2  . glimepiride (AMARYL) 4 MG tablet TAKE 1 TABLET (4 MG TOTAL) BY MOUTH DAILY WITH LUNCH.  90 tablet  3  . glucose blood (PRODIGY TEST) test strip Ck blood sugars once daily or as instructed  50 each  12  . HYDROcodone-acetaminophen (NORCO) 5-325 MG per tablet Take 1 tablet by mouth every 6 (six) hours as needed for pain.  30 tablet  0  . Insulin Detemir (LEVEMIR FLEXTOUCH) 100 UNIT/ML Pen Inject 10 Units into the skin every morning. And pen needles 1/day  15 mL  11  . loperamide (IMODIUM A-D) 2 MG tablet Per box as needed for diarrhea      . Magnesium 250 MG TABS Take 1 tablet by mouth daily.      Marland Kitchen  magnesium hydroxide (MILK OF MAGNESIA) 400 MG/5ML suspension Per bottle as needed for constipation      . meloxicam (MOBIC) 15 MG tablet Take 1 tablet (15 mg total) by mouth daily.  7 tablet  0  . Multiple Vitamins-Minerals (CENTRUM SILVER PO) Take 1 tablet by mouth daily.        Marland Kitchen omeprazole (PRILOSEC) 20 MG capsule Take 20 mg by mouth daily. Before first meal      . sertraline (ZOLOFT) 100 MG tablet TAKE 1 TABLET BY MOUTH DAILY  30 tablet  5  . simethicone (GAS-X) 80 MG chewable tablet Take with meals as needed      . simvastatin (ZOCOR) 40 MG tablet Take 1 tablet (40 mg total) by mouth at bedtime.  90 tablet  3  . Sodium Chloride-Sodium Bicarb (AYR SALINE NASAL RINSE NA) Place into the nose. As needed      . traMADol (ULTRAM) 50 MG tablet TAKE 1 TO 2 TABLETS BY MOUTH EVERY 4 HOURS AS NEEDED FOR COUGH AND PAIN  40 tablet  2  . valsartan-hydrochlorothiazide (DIOVAN HCT) 160-12.5 MG per tablet Take 1 tablet by mouth 2  (two) times daily.  180 tablet  3   No current facility-administered medications on file prior to visit.    Allergies  Allergen Reactions  . Albuterol   . Doxycycline Nausea And Vomiting    Family History  Problem Relation Age of Onset  . Hypertension Mother   . Diabetes Mother   . Diabetes Sister   . Heart disease Sister   . Heart disease Father   . Diabetes Brother   . Diabetes Daughter   . Diabetes Son   . Diabetes Daughter   . Diabetes Son     BP 142/82  Pulse 83  Temp(Src) 98 F (36.7 C) (Oral)  Ht 5\' 2"  (1.575 m)  Wt 146 lb (66.225 kg)  BMI 26.70 kg/m2  SpO2 98%  Review of Systems She denies hypoglycemia and weight change.      Objective:   Physical Exam VITAL SIGNS:  See vs page GENERAL: no distress Gait is slow but steady  Lab Results  Component Value Date   HGBA1C 9.3* 01/29/2014       Assessment & Plan:  DM: control is improved Noncompliance with cbg recording (she checks in am only): I advised her to check at different times of day.   Shoulder pain: if today's fructosamine is good, i'll clear her for surgery, form the standpoint of DM   Patient is advised the following: Patient Instructions  check your blood sugar once a day.  vary the time of day when you check, between before the 3 meals, and at bedtime.  also check if you have symptoms of your blood sugar being too high or too low.  please keep a record of the readings and bring it to your next appointment here.  You can write it on any piece of paper.  please call us sooner if your blood sugar goes below 70, or if you have a lot of readings over 200.  Please come back for a follow-up appointment in 2 months.   blood tests are being requested for you today.  We'll contact you with results.  If it is ok, i'll give you the go-ahead to have your shoulder surgery.

## 2014-03-17 ENCOUNTER — Encounter: Payer: Self-pay | Admitting: Endocrinology

## 2014-03-17 LAB — FRUCTOSAMINE: FRUCTOSAMINE: 353 umol/L — AB (ref 190–270)

## 2014-04-22 DIAGNOSIS — M545 Low back pain, unspecified: Secondary | ICD-10-CM | POA: Diagnosis not present

## 2014-04-22 DIAGNOSIS — M25819 Other specified joint disorders, unspecified shoulder: Secondary | ICD-10-CM | POA: Diagnosis not present

## 2014-04-22 DIAGNOSIS — M25519 Pain in unspecified shoulder: Secondary | ICD-10-CM | POA: Diagnosis not present

## 2014-04-28 ENCOUNTER — Other Ambulatory Visit: Payer: Self-pay | Admitting: Adult Health

## 2014-04-29 ENCOUNTER — Telehealth: Payer: Self-pay | Admitting: Endocrinology

## 2014-04-29 MED ORDER — INSULIN DETEMIR 100 UNIT/ML FLEXPEN: PEN_INJECTOR | SUBCUTANEOUS | Status: DC

## 2014-04-29 NOTE — Telephone Encounter (Signed)
Rx sent to per pt's request.  

## 2014-04-29 NOTE — Telephone Encounter (Signed)
Patient need Levemir 20 units faxed to [facility] on [address], and she also needs needles. Thank you

## 2014-05-07 DIAGNOSIS — E119 Type 2 diabetes mellitus without complications: Secondary | ICD-10-CM | POA: Diagnosis not present

## 2014-05-07 DIAGNOSIS — H52209 Unspecified astigmatism, unspecified eye: Secondary | ICD-10-CM | POA: Diagnosis not present

## 2014-05-07 DIAGNOSIS — Z961 Presence of intraocular lens: Secondary | ICD-10-CM | POA: Diagnosis not present

## 2014-05-07 LAB — HM DIABETES EYE EXAM

## 2014-05-09 ENCOUNTER — Encounter: Payer: Self-pay | Admitting: Endocrinology

## 2014-05-15 ENCOUNTER — Encounter: Payer: Self-pay | Admitting: Endocrinology

## 2014-05-15 ENCOUNTER — Ambulatory Visit (INDEPENDENT_AMBULATORY_CARE_PROVIDER_SITE_OTHER): Payer: Medicare Other | Admitting: Endocrinology

## 2014-05-15 VITALS — BP 138/84 | HR 83 | Temp 98.5°F | Ht 62.0 in | Wt 145.0 lb

## 2014-05-15 DIAGNOSIS — E1049 Type 1 diabetes mellitus with other diabetic neurological complication: Secondary | ICD-10-CM

## 2014-05-15 LAB — HEMOGLOBIN A1C: HEMOGLOBIN A1C: 8.6 % — AB (ref 4.6–6.5)

## 2014-05-15 MED ORDER — INSULIN DETEMIR 100 UNIT/ML FLEXPEN: 20.0000 [IU] | PEN_INJECTOR | SUBCUTANEOUS | Status: DC

## 2014-05-15 NOTE — Patient Instructions (Addendum)
check your blood sugar once a day.  vary the time of day when you check, between before the 3 meals, and at bedtime.  also check if you have symptoms of your blood sugar being too high or too low.  please keep a record of the readings and bring it to your next appointment here.  You can write it on any piece of paper.  please call us sooner if your blood sugar goes below 70, or if you have a lot of readings over 200.  Please come back for a follow-up appointment in 2 months.   A diabetes blood test is requested for you today.   Please stop taking the glimepiride, and: Increase the insulin to 25 units each morning.  This is probably not enough, so please call if it gores over 200.

## 2014-05-15 NOTE — Progress Notes (Signed)
Subjective:    Patient ID: [name], female    DOB: 30-Nov-1928, 80 y.o.   MRN: [id number]  HPI pt returns for f/u of insulin-requiring DM (dx'ed in 4709; complicated by PAD; she started taking insulin in mid-2015; she agrees to take insulin only qd; she has never had GDM, pancreatitis, severe hypoglycemia or DKA).  no cbg record, but states cbg's increased after a steroid injection 2 weeks ago, into the left shoulder.  It is now back down.  She says cbg's vary from 100-250.  It is in general higher as the day goes on.   Past Medical History  Diagnosis Date  . Hyperlipidemia   . Hypertension   . Varicose veins   . SOBOE (shortness of breath on exertion)   . Diabetes mellitus without complication     Past Surgical History  Procedure Laterality Date  . Appendectomy  50 years ago    History   Social History  . Marital Status: Single    Spouse Name: N/A    Number of Children: N/A  . Years of Education: N/A   Occupational History  . Not on file.   Social History Main Topics  . Smoking status: Former Smoker -- 0.30 packs/day for 20 years    Types: Cigarettes    Quit date: 09/05/1985  . Smokeless tobacco: Never Used  . Alcohol Use: No  . Drug Use: No  . Sexual Activity: Not on file   Other Topics Concern  . Not on file   Social History Narrative  . No narrative on file    Current Outpatient Prescriptions on File Prior to Visit  Medication Sig Dispense Refill  . acetaminophen (TYLENOL) 500 MG tablet As directed with food when needed for arthritis pain/headache/back pain      . amLODipine (NORVASC) 5 MG tablet Take 1 tablet (5 mg total) by mouth daily.  90 tablet  3  . aspirin 81 MG tablet Take 81 mg by mouth every morning.       . Calcium Carbonate-Vitamin D (CALCIUM 600+D) 600-400 MG-UNIT per tablet Take 1 tablet by mouth daily.      . chlorpheniramine (CHLOR-TRIMETON) 4 MG tablet Take 4 mg by mouth every 6 (six) hours as needed (drippy nose/throat drainage).        Marland Kitchen dextromethorphan-guaiFENesin (MUCINEX DM) 30-600 MG per 12 hr tablet Take 1-2 tablets by mouth every 12 (twelve) hours as needed.      . famotidine (PEPCID) 20 MG tablet Take 20 mg by mouth at bedtime.      . fluticasone (FLONASE) 50 MCG/ACT nasal spray PLACE 2 SPRAYS INTO BOTH NOSTRILS DAILY.  16 g  6  . glucose blood (PRODIGY TEST) test strip Ck blood sugars once daily or as instructed  50 each  12  . HYDROcodone-acetaminophen (NORCO) 5-325 MG per tablet Take 1 tablet by mouth every 6 (six) hours as needed for pain.  30 tablet  0  . loperamide (IMODIUM A-D) 2 MG tablet Per box as needed for diarrhea      . Magnesium 250 MG TABS Take 1 tablet by mouth daily.      . magnesium hydroxide (MILK OF MAGNESIA) 400 MG/5ML suspension Per bottle as needed for constipation      . Multiple Vitamins-Minerals (CENTRUM SILVER PO) Take 1 tablet by mouth daily.        Marland Kitchen omeprazole (PRILOSEC) 20 MG capsule Take 20 mg by mouth daily. Before first meal      .  sertraline (ZOLOFT) 100 MG tablet TAKE 1 TABLET BY MOUTH DAILY  30 tablet  5  . Sodium Chloride-Sodium Bicarb (AYR SALINE NASAL RINSE NA) Place into the nose. As needed      . traMADol (ULTRAM) 50 MG tablet TAKE 1 TO 2 TABLETS BY MOUTH EVERY 4 HOURS AS NEEDED FOR COUGH AND PAIN  40 tablet  2  . valsartan-hydrochlorothiazide (DIOVAN HCT) 160-12.5 MG per tablet Take 1 tablet by mouth 2 (two) times daily.  180 tablet  3  . Cholecalciferol (VITAMIN D-3) 1000 UNITS CAPS Take 1 capsule by mouth daily.      . meloxicam (MOBIC) 15 MG tablet Take 1 tablet (15 mg total) by mouth daily.  7 tablet  0  . simethicone (GAS-X) 80 MG chewable tablet Take with meals as needed      . simvastatin (ZOCOR) 40 MG tablet Take 1 tablet (40 mg total) by mouth at bedtime.  90 tablet  3   No current facility-administered medications on file prior to visit.    Allergies  Allergen Reactions  . Albuterol   . Doxycycline Nausea And Vomiting    Family History  Problem Relation  Age of Onset  . Hypertension Mother   . Diabetes Mother   . Diabetes Sister   . Heart disease Sister   . Heart disease Father   . Diabetes Brother   . Diabetes Daughter   . Diabetes Son   . Diabetes Daughter   . Diabetes Son     BP 138/84  Pulse 83  Temp(Src) 98.5 F (36.9 C) (Oral)  Ht 5\' 2"  (1.575 m)  Wt 145 lb (65.772 kg)  BMI 26.51 kg/m2  SpO2 97%  Review of Systems She denies hypoglycemia and weight change.    Objective:   Physical Exam VITAL SIGNS:  See vs page GENERAL: no distress Pulses: dorsalis pedis intact bilat.   Feet: no deformity.  no edema Skin:  no ulcer on the feet.  normal color and temp. Neuro: sensation is intact to touch on the feet   Lab Results  Component Value Date   HGBA1C 8.6* 05/15/2014      Assessment & Plan:  DM: moderate exacerbation. Shoulder pain: steroid injection is affecting a1c, but she needs the injection. Noncompliance with cbg recording: I'll work around this as best I can.   Patient is advised the following: Patient Instructions  check your blood sugar once a day.  vary the time of day when you check, between before the 3 meals, and at bedtime.  also check if you have symptoms of your blood sugar being too high or too low.  please keep a record of the readings and bring it to your next appointment here.  You can write it on any piece of paper.  please call us sooner if your blood sugar goes below 70, or if you have a lot of readings over 200.  Please come back for a follow-up appointment in 2 months.   A diabetes blood test is requested for you today.   Please stop taking the glimepiride, and: Increase the insulin to 25 units each morning.  This is probably not enough, so please call if it gores over 200.

## 2014-05-29 ENCOUNTER — Ambulatory Visit (INDEPENDENT_AMBULATORY_CARE_PROVIDER_SITE_OTHER): Payer: Medicare Other | Admitting: Internal Medicine

## 2014-05-29 ENCOUNTER — Encounter: Payer: Self-pay | Admitting: Internal Medicine

## 2014-05-29 VITALS — BP 120/70 | HR 73 | Temp 97.8°F | Ht 62.0 in | Wt 148.0 lb

## 2014-05-29 DIAGNOSIS — M2550 Pain in unspecified joint: Secondary | ICD-10-CM | POA: Diagnosis not present

## 2014-05-29 DIAGNOSIS — I10 Essential (primary) hypertension: Secondary | ICD-10-CM | POA: Diagnosis not present

## 2014-05-29 DIAGNOSIS — E119 Type 2 diabetes mellitus without complications: Secondary | ICD-10-CM | POA: Diagnosis not present

## 2014-05-29 DIAGNOSIS — J310 Chronic rhinitis: Secondary | ICD-10-CM | POA: Diagnosis not present

## 2014-05-29 DIAGNOSIS — F329 Major depressive disorder, single episode, unspecified: Secondary | ICD-10-CM

## 2014-05-29 DIAGNOSIS — F3289 Other specified depressive episodes: Secondary | ICD-10-CM

## 2014-05-29 NOTE — Progress Notes (Signed)
Subjective:    Patient ID: [name], female    DOB: 12/18/1928  MRN: [id number]   Brief patient profile:  89  yobf quit smoking 1987 with obesity/ aodm with multiple chronic complaints and documented rhinitis with inconsistent compliance with nasal steroids    History of Present Illness  May 20, 2010 ov c/o waking up with HA almost every am. She states that her back/ left positional shoulder pain is the same- no better or worse, but is resolved with aleve. She c/o dry cough at night x 1-2 wks. bp too high rec double dose of diovan   July 13, 2010 HA and dizziness. She also c/o ringing in both ears x several days. really bad 11/5 better now and hears ok.. no room spinning but dizzy, has not tried meclizine. no ha, ataxia, nausea.   04/20/2011 f/u ov/Wert cc cpx/ leg cramping once or twice a week x months. No pain walking, no numbness.  Occ dizzy but not orthostatic related and goes away before she has time to try her meclizine prn rec Try quinine in the form of tonic water at bedtime as needed for cramps See calendar for specific medication instructions   07/20/2011 f/u ov/Wert cc hurting all over every night when lie down with stiffness in am indolent onset, progressively worse not much better with aleve but only takes one a day.  No cough or sob. No overt symptoms of hyper or hypoglycemia.  Recurrent L ant cp x years, worse in sitting position, assoc with constipation.  >>rx trazadone.   07/27/2011 Follow up and Med review Pt returns for follow up and med review. Last visit , trouble with sleeping, Trazadone was added. Pt says she is sleeping much better. Does complain over last 5 days , cough and congestion have started. Complains of head congestion, prod cough with small amounts of yellow mucus x1week .  No hemoptysis . Does have some throat drainage and dry cough at night. We discussed her chlor tabs for this-she has not started this yet. Using mucinex without much help.   We reviewed all her meds and organized them into her med calendar with update, and pt education  Labs done last ov were essentially unremarkable except for A1C was unchanged at 8.4 - we discussed diet.  rec Zpack Take as directed.  Follow med calendar closely and bring to each visit.    10/24/2011 ov Wert/ f/u multiple chronic problems fasting for f/u lost the new med calendar provided but c/o chest pains went away " awhile ago" not reproducible with exertions.  HA no better, worse with anxiety not in am's and no assoc neuro cos, napping in afternoons sleeping poorly on trazadone 25. rec Please remember to go to the lab > all labs improving including hgba1c down from 8.4 to 7.6 Increase trazadone to 50 mg one at bedtime and no napping during the day until sleeping better at night See calendar for specific medication instructions      01/23/2012 f/u ov/Wert f/u hbp/ dm/hyperlipidemia/ insomnia cc sleeping better on melatonin, more active during the day and no high or low sugar symtoms, no tia or claudication or sob though she's relatively sedentary.  Following meds on calendar well, only stopped the trazadone because found it didn't sleep as well as melatonin.  Only using from prn list occ aleve. rec Follow med calendar   04/23/2012 f/u ov/Wert cc multiple active medical problems dm/ hbp / hyperlipidemia. Main complaint is variable R back pain x  3 months previously injected but don't remember by whom, no radicular symptoms. Has not tried nsaids though they are already on list for prn. No localized numbness/ weakness/ no pain with cough.  Overall worse with walking, better supine     >>aleve, fasting labs , tspine -DJD changes   06/04/2012 Follow up and med review   We reviewed all her meds and organized them into her med calendar with update, and pt education  Appears she is taking meds correctly and consistently.  CPX last ov showed DM not as well controlled with  A1C up 8.5 from 7.6  We  discussed diet and need to increased Amaryl. No low sugars on current regimen.  B/P has been running up 140-160.  She has been taking Aleve on regular basis for joint pains.  We discussed stopping NSAIDS and using tylenol arthritis and vicodin for severe pain.  Renal fxn was worse with scr at 1.3  No cp, dyspnea or edema.  >>Stop NSAIDS , Increase Glimepiride 4mg  daily  07/02/2012 Follow up  Patient returns for a 4 week followup for hypertension and diabetes. Last visit blood pressure was elevated. She was recommended to stop all NSAIDs Blood sugars were also elevated. She was instructed to increase glimepiride 4 mg daily Blood sugars have been averaging around 100-150 fasting, she has had no hypoglycemia episodes. Blood pressures are averaging 749 systolic She denies any chest pain, orthopnea, PND, or leg swelling rec Change Diovan 320/12.5mg  daily  Avoid NSAIDS -advil, excederin -they can raise blood pressure and affect the kidneys.  Check sugars 3 days a week ,  call if <80 or >200 Check blood pressures 3 days a week -call if >160    01/10/2013 f/u ov/Wert multiple chronic issues hbp, dm, arthritis Chief Complaint  Patient presents with  . Acute Visit    Pt c/o fatigue, nausea and "feeling jittery" x 2 wks.   No relation to meals or activity, no cp or ha or sob, symptoms come and go day > nightly >> no change rx  01/30/13 Follow up and med review  W except taking 1/2 tab of statin and suppose to a whole tabe reviewed all her meds and organized them into her med calendar with update, and pt education  Appears she is taking meds correctly,.  Complains of head congestion, runny nose, wheezing qhs x2 weeks d/t allergies. Does have thick mucus at times.  No fever , chest pain or edema.  Labs from last ov with A1C tr up at 8.7 and LDL 140  We discussed DM diet , sugars .  Discussed if not improving will need to consider Insulin therapy at next ov +/- Endocrine referral.  rec Add  Amlodipine 5mg  daily  Low salt diet  Low sweet diet  Zpack Take as directed.  Stop Mucinex D.  Avoid NSAIDS -aleve, advil, etc Avoid Decongestants -sudafed meds.  Restart Metformin 500mg  Twice daily   Saline nasal rinses As needed  Nasal congestion .  Use Nasonex 2 puffs Twice daily  Until sample is gone.  Follow med calendar closely and bring to each visit.  We will recheck Diabetes lab at next office visit , if not improved will need to start on Insulin .  Follow up [place] in 3 months with labs and As needed   Increase Simvastatin 40mg  At bedtime  03/19/2013 f/u ov/Wert re multiple complaints Chief Complaint  Patient presents with  . Follow-up    Pt c/o runny nose, nasal congestion and  dry cough for the past several wks. She also stiffness in her neck for the past wk.   nasal congestion bilaterally difficulty breathing through nose since mid May 2014,  Some yellow mucus, difficulty breathing nose is what's keeping her up, assoc with hacking cough and generalized chest and trunk discomfort worse with coughing.  No sinus pain, nasal discharge is beige to watery. Has not taken meds on action plan part of calendar for symptoms addressed on the list from previous visits >>augmentin rx    04/02/2013 Follow up and med review  We reviewed all her meds and organized them into her med calendar with update, and pt education  Appears she is taking meds correctly, Patient was seen 2 weeks ago and felt to have a sinus infection. She was given Augmentin but unfortunately was only able to take it for 4 days. Due to severe diarrhea. Patient reports her sinus condition did improve however, never totally went away, and is starting to increase with sinus pain pressure.  Continues to complain of some intermittent neck pain. Pain medicines, to help. Patient was simvastatin was stopped for possible relation to, muscle pain. However, she did not see any change off of her simvastatin for the last 2 week Chest  x-ray done last visit was normal Patient denies any hemoptysis, chest pain, orthopnea, PND, fever, extremity weakness, visual or speech changes, or difficulty swallowing. >>omnicef and pred pack  04/19/2013 Follow Up  Patient returns for a two-week followup Hasn't had a slow to resolve, sinusitis, and asthmatic bronchitis. She was treated with Omnicef for 10 days and a prednisone taper. Patient reports that she is much improved with decreased cough and congestion. Patient also is having some cervical neck pain felt to be musculoskeletal. This is also improved with prednisone.    06/19/2013 f/u ov/Wert re: arthritis/ hbp/ depression Chief Complaint  Patient presents with  . Annual Exam    Pt states doing well and denies any co's today.     >>labs   07/29/2013 Follow up  Patient returns for one-month followup, and medication review. We reviewed all her medications and organized them into a medication calendar. Patient was seen last visit for a physical. Labs showed her diabetes and, hyperlipidemia. We're not at goal. A1c had increased to 8.9 and her LDL was at 190. Patient explains that she had recently had a slow to resolve bronchitis, and was treated with antibiotics, and prednisone. Patient says it took her blood sugars antibiotic, it down after this. She also held her cholesterol medicine while she was on antibiotics, and prednisone and had just restarted them. Prior to her lab work. Patient denies any polyuria, polydipsia, chest pain, orthopnea, PND, or leg swelling. We discussed in detail a healthy, diabetic diet, along with low-cholesterol choices. Patient has agreed that if her diabetes is not under better control that we will refer her to an endocrinologist for further evaluation  and treatment options. >restarted statin   10/28/2013 Follow up  Returns for follow up for DM and Hyperlipidemia.   Complains that her shoulder and back are acting up. More stiffness and pain on/off over last  6 months.  She denies any known injury. She denies any extremity weakness, radicular symptoms. No rash. She has not taken any medications for treatment. We discussed using ice and heat.   FBS running 148-190. Patient says that she has tried eat somewhat better. However, does not do as well. Every day as she should. Patient denies any polyuria, polydipsia, chest  pain, shortness of breath, abdominal pain, nausea, vomiting, or edema. Patient is taking her simvastatin as directed. She is fasting for labs. Today She is overdue for her mammogram and has agreed to go for this in the next couple weeks.  01/29/2014 Follow up  Returns for follow up for DM and Hyperlipidemia.  Says BS been okay at home  100 - 140 .A1c was improved last time 8.9>8.0.  Labs showed kidney fxn w/ scr not as good at 1.3, advised to avoid NSAIDs.  Did not go for mammogram as discussed , plans to go next month .  Does request ortho referral as back seems to more painful and aches on/off . Has not taken tylenol or tramadol as previously discussed . No leg weakness but does complain of leg burning at times bilaterally.  No radicular symptoms. Says she seen by ortho years ago with steroid injection that helped her greatly.  She denies any chest pain, palpitations, presyncopal or syncopal episodes, nausea, vomiting, diarrhea, visual or speech changes, extremity weakness. rec Low salt diet  Low sweet diet  Mammogram -please get this before next visit. -DON'T FORGET PLEASE  Set up eye doctor appointment .   05/29/2014 f/u ov/Wert re: dm now insulin dep/ no med calendar / hbp / djd / chronic rhinitis with pnds /GERD Chief Complaint  Patient presents with  . Follow-up    none     Not limited by breathing from desired activities     No obvious day to day or daytime variabilty or assoc chronic cough or cp or chest tightness, subjective wheeze overt sinus or hb symptoms. No unusual exp hx or h/o childhood pna/ asthma or knowledge  of premature birth.  Sleeping ok without nocturnal  or early am exacerbation  of respiratory  c/o's or need for noct saba. Also denies any obvious fluctuation of symptoms with weather or environmental changes or other aggravating or alleviating factors except as outlined above   Current Medications, Allergies, Complete Past Medical History, Past Surgical History, Family History, and Social History were reviewed in [employer] record.  ROS  The following are not active complaints unless bolded sore throat, dysphagia, dental problems, itching, sneezing,  nasal congestion or excess/ purulent secretions, ear ache,   fever, chills, sweats, unintended wt loss, pleuritic or exertional cp, hemoptysis,  orthopnea pnd or leg swelling, presyncope, palpitations, heartburn, abdominal pain, anorexia, nausea, vomiting, diarrhea  or change in bowel or urinary habits, change in stools or urine, dysuria,hematuria,  rash, arthralgias, visual complaints, headache, numbness weakness or ataxia or problems with walking or coordination,  change in mood/affect or memory.                Past Medical History:  HYPERTENSION (ICD-401.9)  HYPERLIPIDEMIA (ICD-272.4)  - target --LDL <70 (DM) due to DM  AODM  CHRONIC COUGH  - onset 1990's, almost daly since> resolved May 04, 2009 with ppi two times a day and chlortrimeton  - denied improved on ppi so try off effective August 19, 2009 > worse cough and constipation > restarted  CHRONIC RHINITIS (ICD-472.0)-non adherent with nasal steroids  -Ct sinus nml (08/1999, 01/2005)  Hx of URTICARIA (ICD-708.9)  LUMBAR RADICULOPATHY, RIGHT (ICD-724.4)  DEPRESSION (ICD-311)   MORBID OBESITY (ICD-278.01)  - Target wt = 158 for BMI < 30  VIT D DEFICIENCY dx 08/28/08  - Rx with 50 k biw x 12weeks 09/01/08 > recheck 04/20/2011  Osteopenia  -BMD 04/2000 -nml (-0.4 hip)  -BMD 09/15/08  T spine 3.3, L Fem -1.0, R Fem - 1.0  HEALTH  MAINENANCE.......................................................................................Marland KitchenWert  -Td 09/2004 -Pneumovax (1998), 05/02/2008  -Mammogram (9/08)-nml , >06/04/2012 >> - Colonsocopy nl 05/2008............................................................................Marland KitchenDelfin Edis  -GYN......................................................................................................... Matthew Saras   -CPX  06/19/2013  -refused flu shot August 18, 2009 and May 20, 2010 , 06/04/2012  COMPLEX MED REGIMEN  -- med calendar completed/adjusted reviewed November 18, 2009 but not following > using it May 20, 2010 , 07/27/2011 , 06/04/2012 .04/02/2013 > did not bring with her 05/29/14       Objective:   Physical Exam  wt   155 Jan 25, 2010 > Wt 153 04/20/2011 >  10/24/2011 152 > 01/23/2012 151> 04/23/2012  149>152 06/04/2012 >  147 01/10/2013 >151 02/01/2013 >  148> 147 04/02/2013 >145 04/19/2013 > 06/19/2013 148 >148 10/28/2013 >150 01/29/2014 > 05/29/2014   148   amb bf nad very somber HEENT: edentulous with dentures in place, nl turbinates, and orophanx. Nl external ear canals without cough reflex  Max sinus tendeness  NECK : without JVD/Nodes/TM/ nl carotid upstrokes bilaterally  LUNGS:  no acc muscle use, clear to A and P bilaterally without cough on insp or exp maneuvers  CV: RRR no s3 or murmur or increase in P2, no edema .  Pulses sym bilaterally in feet ABD: soft and nontender with nl excursion in the supine position. No bruits or organomegaly, bowel sounds nl  MS: warm without deformities, calf tenderness, cyanosis or clubbing. Neck full rom, nml grips and equal strength,       CXR  06/19/2013 :  Stable chest x-ray. Borderline cardiomegaly. No active lung disease.           Assessment & Plan:

## 2014-05-29 NOTE — Patient Instructions (Addendum)
I will ask Dr Arnoldo Lenis to assume your primary care or refer you to the colleague of his choice   Follow up in the pulmonary clinic is as needed

## 2014-05-30 ENCOUNTER — Telehealth: Payer: Self-pay | Admitting: Endocrinology

## 2014-05-30 NOTE — Assessment & Plan Note (Signed)
Adequate control on present rx, reviewed > no change in rx needed   

## 2014-05-30 NOTE — Telephone Encounter (Signed)
please call patient: please call [phone] [facility] cone physician referral line), to get an appointment with a primary doctor

## 2014-05-30 NOTE — Assessment & Plan Note (Signed)
Adequate control on present rx, reviewed > no change in rx needed  > needs primary care f/u

## 2014-05-30 NOTE — Assessment & Plan Note (Signed)
All f/u per Dr Loanne Drilling

## 2014-05-30 NOTE — Assessment & Plan Note (Signed)
rec f/u per Dr Loanne Drilling

## 2014-05-30 NOTE — Assessment & Plan Note (Signed)
Not clear she's compliant with zoloft but should def continue

## 2014-05-30 NOTE — Assessment & Plan Note (Signed)
Adequate control on present rx, reviewed > no change in rx needed     See instructions for specific recommendations which were reviewed directly with the patient who was given a copy with highlighter outlining the key components.

## 2014-05-30 NOTE — Telephone Encounter (Signed)
Pt advised.

## 2014-06-25 ENCOUNTER — Other Ambulatory Visit: Payer: Self-pay

## 2014-06-25 MED ORDER — INSULIN DETEMIR 100 UNIT/ML FLEXPEN: 20.0000 [IU] | PEN_INJECTOR | SUBCUTANEOUS | Status: DC

## 2014-06-27 ENCOUNTER — Other Ambulatory Visit: Payer: Self-pay | Admitting: Adult Health

## 2014-06-27 MED ORDER — FLUTICASONE PROPIONATE 50 MCG/ACT NA SUSP
NASAL | Status: DC
Start: 2014-06-27 — End: 2014-06-30

## 2014-06-27 NOTE — Telephone Encounter (Signed)
Received fax from [place] requesting 90 day supply on fluticasone Order sent

## 2014-06-30 ENCOUNTER — Other Ambulatory Visit: Payer: Self-pay | Admitting: Internal Medicine

## 2014-06-30 MED ORDER — SERTRALINE HCL 100 MG PO TABS: ORAL_TABLET | ORAL | Status: DC

## 2014-06-30 MED ORDER — FLUTICASONE PROPIONATE 50 MCG/ACT NA SUSP: NASAL | Status: DC

## 2014-06-30 MED ORDER — VALSARTAN-HYDROCHLOROTHIAZIDE 160-12.5 MG PO TABS
1.0000 | ORAL_TABLET | Freq: Two times a day (BID) | ORAL | Status: DC
Start: 2014-06-30 — End: 2014-09-17

## 2014-07-15 ENCOUNTER — Ambulatory Visit: Payer: Medicare Other | Admitting: Endocrinology

## 2014-07-16 ENCOUNTER — Ambulatory Visit (INDEPENDENT_AMBULATORY_CARE_PROVIDER_SITE_OTHER): Payer: Medicare Other | Admitting: Endocrinology

## 2014-07-16 ENCOUNTER — Encounter: Payer: Self-pay | Admitting: Endocrinology

## 2014-07-16 VITALS — BP 134/78 | HR 85 | Temp 98.1°F | Ht 62.0 in | Wt 151.0 lb

## 2014-07-16 DIAGNOSIS — E1042 Type 1 diabetes mellitus with diabetic polyneuropathy: Secondary | ICD-10-CM | POA: Diagnosis not present

## 2014-07-16 MED ORDER — INSULIN DETEMIR 100 UNIT/ML FLEXPEN: 30.0000 [IU] | PEN_INJECTOR | SUBCUTANEOUS | Status: DC

## 2014-07-16 NOTE — Patient Instructions (Addendum)
check your blood sugar once a day.  vary the time of day when you check, between before the 3 meals, and at bedtime.  also check if you have symptoms of your blood sugar being too high or too low.  please keep a record of the readings and bring it to your next appointment here.  You can write it on any piece of paper.  please call us sooner if your blood sugar goes below 70, or if you have a lot of readings over 200.  Please come back for a follow-up appointment in 1 month.   Please increase the levemir to 30 units each morning.

## 2014-07-16 NOTE — Progress Notes (Signed)
Subjective:    Patient ID: [name], female    DOB: [birth year], 80 y.o.   MRN: [id number]  HPI  Pt returns for f/u of diabetes mellitus: DM type: Insulin-requiring type 2 Dx'ed: 4196 Complications: PAD Therapy: insulin since mid-2015. GDM: never DKA: never Severe hypoglycemia: never Pancreatitis: never Other: she agrees to take insulin only qd Interval history: she brings a record of her cbg's which i have reviewed today.  It varies from 138-238.  It is in general higher as the day goes on.  pt states she feels well in general.   Past Medical History  Diagnosis Date  . Hyperlipidemia   . Hypertension   . Varicose veins   . SOBOE (shortness of breath on exertion)   . Diabetes mellitus without complication     Past Surgical History  Procedure Laterality Date  . Appendectomy  50 years ago    History   Social History  . Marital Status: Single    Spouse Name: N/A    Number of Children: N/A  . Years of Education: N/A   Occupational History  . Not on file.   Social History Main Topics  . Smoking status: Former Smoker -- 0.30 packs/day for 20 years    Types: Cigarettes    Quit date: 09/05/1985  . Smokeless tobacco: Never Used  . Alcohol Use: No  . Drug Use: No  . Sexual Activity: Not on file   Other Topics Concern  . Not on file   Social History Narrative    Current Outpatient Prescriptions on File Prior to Visit  Medication Sig Dispense Refill  . acetaminophen (TYLENOL) 500 MG tablet As directed with food when needed for arthritis pain/headache/back pain    . amLODipine (NORVASC) 5 MG tablet Take 1 tablet (5 mg total) by mouth daily. 90 tablet 3  . aspirin 81 MG tablet Take 81 mg by mouth every morning.     . Calcium Carbonate-Vitamin D (CALCIUM 600+D) 600-400 MG-UNIT per tablet Take 1 tablet by mouth daily.    . chlorpheniramine (CHLOR-TRIMETON) 4 MG tablet Take 4 mg by mouth every 6 (six) hours as needed (drippy nose/throat drainage).     .  Cholecalciferol (VITAMIN D-3) 1000 UNITS CAPS Take 1 capsule by mouth daily.    Marland Kitchen dextromethorphan-guaiFENesin (MUCINEX DM) 30-600 MG per 12 hr tablet Take 1-2 tablets by mouth every 12 (twelve) hours as needed.    . famotidine (PEPCID) 20 MG tablet Take 20 mg by mouth at bedtime.    . fluticasone (FLONASE) 50 MCG/ACT nasal spray PLACE 2 SPRAYS INTO BOTH NOSTRILS DAILY. 10 g 0  . glucose blood (PRODIGY TEST) test strip Ck blood sugars once daily or as instructed 50 each 12  . HYDROcodone-acetaminophen (NORCO) 5-325 MG per tablet Take 1 tablet by mouth every 6 (six) hours as needed for pain. 30 tablet 0  . loperamide (IMODIUM A-D) 2 MG tablet Per box as needed for diarrhea    . Magnesium 250 MG TABS Take 1 tablet by mouth daily.    . magnesium hydroxide (MILK OF MAGNESIA) 400 MG/5ML suspension Per bottle as needed for constipation    . meloxicam (MOBIC) 15 MG tablet Take 1 tablet (15 mg total) by mouth daily. 7 tablet 0  . Multiple Vitamins-Minerals (CENTRUM SILVER PO) Take 1 tablet by mouth daily.      Marland Kitchen omeprazole (PRILOSEC) 20 MG capsule Take 20 mg by mouth daily. Before first meal    . sertraline (ZOLOFT) 100  MG tablet TAKE 1 TABLET BY MOUTH DAILY 30 tablet 0  . simethicone (GAS-X) 80 MG chewable tablet Take with meals as needed    . simvastatin (ZOCOR) 40 MG tablet Take 1 tablet (40 mg total) by mouth at bedtime. 90 tablet 3  . Sodium Chloride-Sodium Bicarb (AYR SALINE NASAL RINSE NA) Place into the nose. As needed    . traMADol (ULTRAM) 50 MG tablet TAKE 1 TO 2 TABLETS BY MOUTH EVERY 4 HOURS AS NEEDED FOR COUGH AND PAIN 40 tablet 2  . valsartan-hydrochlorothiazide (DIOVAN HCT) 160-12.5 MG per tablet Take 1 tablet by mouth 2 (two) times daily. 60 tablet 0   No current facility-administered medications on file prior to visit.    Allergies  Allergen Reactions  . Albuterol   . Doxycycline Nausea And Vomiting    Family History  Problem Relation Age of Onset  . Hypertension Mother   .  Diabetes Mother   . Diabetes Sister   . Heart disease Sister   . Heart disease Father   . Diabetes Brother   . Diabetes Daughter   . Diabetes Son   . Diabetes Daughter   . Diabetes Son     BP 134/78 mmHg  Pulse 85  Temp(Src) 98.1 F (36.7 C) (Oral)  Ht 5\' 2"  (1.575 m)  Wt 151 lb (68.493 kg)  BMI 27.61 kg/m2  SpO2 95%  Review of Systems She denies hypoglycemia and weight change.      Objective:   Physical Exam VITAL SIGNS:  See vs page GENERAL: no distress Pulses: dorsalis pedis intact bilat.   Feet: no deformity.  no edema Skin:  no ulcer on the feet.  normal color and temp. Neuro: sensation is intact to touch on the feet      Assessment & Plan:  DM: moderate exacerbation   Patient is advised the following: Patient Instructions  check your blood sugar once a day.  vary the time of day when you check, between before the 3 meals, and at bedtime.  also check if you have symptoms of your blood sugar being too high or too low.  please keep a record of the readings and bring it to your next appointment here.  You can write it on any piece of paper.  please call us sooner if your blood sugar goes below 70, or if you have a lot of readings over 200.  Please come back for a follow-up appointment in 1 month.   Please increase the levemir to 30 units each morning.

## 2014-08-04 ENCOUNTER — Ambulatory Visit: Payer: Medicare Other | Admitting: Internal Medicine

## 2014-08-07 DIAGNOSIS — M25512 Pain in left shoulder: Secondary | ICD-10-CM | POA: Diagnosis not present

## 2014-08-14 ENCOUNTER — Other Ambulatory Visit: Payer: Self-pay | Admitting: Internal Medicine

## 2014-08-15 ENCOUNTER — Encounter: Payer: Self-pay | Admitting: Endocrinology

## 2014-08-15 ENCOUNTER — Ambulatory Visit (INDEPENDENT_AMBULATORY_CARE_PROVIDER_SITE_OTHER): Payer: Medicare Other | Admitting: Endocrinology

## 2014-08-15 VITALS — BP 122/70 | HR 85 | Temp 98.8°F | Ht 62.0 in | Wt 149.0 lb

## 2014-08-15 DIAGNOSIS — E1042 Type 1 diabetes mellitus with diabetic polyneuropathy: Secondary | ICD-10-CM

## 2014-08-15 DIAGNOSIS — E119 Type 2 diabetes mellitus without complications: Secondary | ICD-10-CM

## 2014-08-15 DIAGNOSIS — E785 Hyperlipidemia, unspecified: Secondary | ICD-10-CM | POA: Diagnosis not present

## 2014-08-15 LAB — BASIC METABOLIC PANEL
BUN: 33 mg/dL — AB (ref 6–23)
CALCIUM: 9.8 mg/dL (ref 8.4–10.5)
CO2: 24 mEq/L (ref 19–32)
Chloride: 102 mEq/L (ref 96–112)
Creatinine, Ser: 1.3 mg/dL — ABNORMAL HIGH (ref 0.4–1.2)
GFR: 49.35 mL/min — ABNORMAL LOW (ref >60.00)
Glucose, Bld: 311 mg/dL — ABNORMAL HIGH (ref 70–99)
Potassium: 3.8 mEq/L (ref 3.5–5.1)
Sodium: 133 mEq/L — ABNORMAL LOW (ref 135–145)

## 2014-08-15 LAB — LIPID PANEL
Cholesterol: 226 mg/dL — ABNORMAL HIGH (ref 0–200)
HDL: 41.7 mg/dL (ref >39.00)
NonHDL: 184.3
Total CHOL/HDL Ratio: 5
Triglycerides: 210 mg/dL — ABNORMAL HIGH (ref 0.0–149.0)
VLDL: 42 mg/dL — ABNORMAL HIGH (ref 0.0–40.0)

## 2014-08-15 LAB — LDL CHOLESTEROL, DIRECT: LDL DIRECT: 145.8 mg/dL

## 2014-08-15 LAB — MICROALBUMIN / CREATININE URINE RATIO
CREATININE, U: 106.2 mg/dL
MICROALB/CREAT RATIO: 0.9 mg/g (ref 0.0–30.0)
Microalb, Ur: 1 mg/dL (ref 0.0–1.9)

## 2014-08-15 LAB — HEMOGLOBIN A1C: Hgb A1c MFr Bld: 9.3 % — ABNORMAL HIGH (ref 4.6–6.5)

## 2014-08-15 LAB — TSH: TSH: 2.26 u[IU]/mL (ref 0.35–4.50)

## 2014-08-15 MED ORDER — INSULIN DETEMIR 100 UNIT/ML FLEXPEN: 35.0000 [IU] | PEN_INJECTOR | SUBCUTANEOUS | Status: DC

## 2014-08-15 NOTE — Patient Instructions (Addendum)
check your blood sugar once a day.  vary the time of day when you check, between before the 3 meals, and at bedtime.  also check if you have symptoms of your blood sugar being too high or too low.  please keep a record of the readings and bring it to your next appointment here.  You can write it on any piece of paper.  please call us sooner if your blood sugar goes below 70, or if you have a lot of readings over 200.  Please come back for a follow-up appointment in 6 weeks Please increase the levemir to 35 units each morning.  blood and urine tests are being requested for you today.  We'll let you know about the results.

## 2014-08-15 NOTE — Progress Notes (Signed)
Subjective:    Patient ID: [name], female    DOB: 09/04/1929, 80 y.o.   MRN: [id number]  HPI Pt returns for f/u of diabetes mellitus: DM type: Insulin-requiring type 2 Dx'ed: 6384 Complications: PAD Therapy: insulin since mid-2015. GDM: never DKA: never Severe hypoglycemia: never Pancreatitis: never Other: she agrees to take insulin only qd Interval history: no cbg record, but states cbg's are in the high-100's.  There is no trend throughout the day.  pt states she feels well in general.  She says she never misses the insulin.   Past Medical History  Diagnosis Date  . Hyperlipidemia   . Hypertension   . Varicose veins   . SOBOE (shortness of breath on exertion)   . Diabetes mellitus without complication     Past Surgical History  Procedure Laterality Date  . Appendectomy  50 years ago    History   Social History  . Marital Status: Single    Spouse Name: N/A    Number of Children: N/A  . Years of Education: N/A   Occupational History  . Not on file.   Social History Main Topics  . Smoking status: Former Smoker -- 0.30 packs/day for 20 years    Types: Cigarettes    Quit date: 09/05/1985  . Smokeless tobacco: Never Used  . Alcohol Use: No  . Drug Use: No  . Sexual Activity: Not on file   Other Topics Concern  . Not on file   Social History Narrative    Current Outpatient Prescriptions on File Prior to Visit  Medication Sig Dispense Refill  . acetaminophen (TYLENOL) 500 MG tablet As directed with food when needed for arthritis pain/headache/back pain    . amLODipine (NORVASC) 5 MG tablet Take 1 tablet (5 mg total) by mouth daily. 90 tablet 3  . aspirin 81 MG tablet Take 81 mg by mouth every morning.     . Calcium Carbonate-Vitamin D (CALCIUM 600+D) 600-400 MG-UNIT per tablet Take 1 tablet by mouth daily.    . chlorpheniramine (CHLOR-TRIMETON) 4 MG tablet Take 4 mg by mouth every 6 (six) hours as needed (drippy nose/throat drainage).     .  Cholecalciferol (VITAMIN D-3) 1000 UNITS CAPS Take 1 capsule by mouth daily.    Marland Kitchen dextromethorphan-guaiFENesin (MUCINEX DM) 30-600 MG per 12 hr tablet Take 1-2 tablets by mouth every 12 (twelve) hours as needed.    . famotidine (PEPCID) 20 MG tablet Take 20 mg by mouth at bedtime.    . fluticasone (FLONASE) 50 MCG/ACT nasal spray PLACE 2 SPRAYS INTO BOTH NOSTRILS DAILY. 10 g 0  . glucose blood (PRODIGY TEST) test strip Ck blood sugars once daily or as instructed 50 each 12  . HYDROcodone-acetaminophen (NORCO) 5-325 MG per tablet Take 1 tablet by mouth every 6 (six) hours as needed for pain. 30 tablet 0  . loperamide (IMODIUM A-D) 2 MG tablet Per box as needed for diarrhea    . Magnesium 250 MG TABS Take 1 tablet by mouth daily.    . magnesium hydroxide (MILK OF MAGNESIA) 400 MG/5ML suspension Per bottle as needed for constipation    . meloxicam (MOBIC) 15 MG tablet Take 1 tablet (15 mg total) by mouth daily. 7 tablet 0  . Multiple Vitamins-Minerals (CENTRUM SILVER PO) Take 1 tablet by mouth daily.      Marland Kitchen omeprazole (PRILOSEC) 20 MG capsule Take 20 mg by mouth daily. Before first meal    . sertraline (ZOLOFT) 100 MG tablet TAKE  1 TABLET BY MOUTH DAILY 30 tablet 0  . simethicone (GAS-X) 80 MG chewable tablet Take with meals as needed    . simvastatin (ZOCOR) 40 MG tablet Take 1 tablet (40 mg total) by mouth at bedtime. 90 tablet 3  . Sodium Chloride-Sodium Bicarb (AYR SALINE NASAL RINSE NA) Place into the nose. As needed    . traMADol (ULTRAM) 50 MG tablet TAKE 1 TO 2 TABLETS BY MOUTH EVERY 4 HOURS AS NEEDED FOR COUGH AND PAIN 40 tablet 2  . valsartan-hydrochlorothiazide (DIOVAN HCT) 160-12.5 MG per tablet Take 1 tablet by mouth 2 (two) times daily. 60 tablet 0   No current facility-administered medications on file prior to visit.    Allergies  Allergen Reactions  . Albuterol   . Doxycycline Nausea And Vomiting    Family History  Problem Relation Age of Onset  . Hypertension Mother   .  Diabetes Mother   . Diabetes Sister   . Heart disease Sister   . Heart disease Father   . Diabetes Brother   . Diabetes Daughter   . Diabetes Son   . Diabetes Daughter   . Diabetes Son     BP 122/70 mmHg  Pulse 85  Temp(Src) 98.8 F (37.1 C) (Oral)  Ht 5\' 2"  (1.575 m)  Wt 149 lb (67.586 kg)  BMI 27.25 kg/m2  SpO2 95%    Review of Systems She denies hypoglycemia.  She has weight gain.    Objective:   Physical Exam VITAL SIGNS:  See vs page GENERAL: no distress Pulses: dorsalis pedis intact bilat.   Feet: no deformity.  no edema Skin:  no ulcer on the feet.  normal color and temp. Neuro: sensation is intact to touch on the feet.     Lab Results  Component Value Date   HGBA1C 9.3* 08/15/2014   Lab Results  Component Value Date   CHOL 226* 08/15/2014   HDL 41.70 08/15/2014   LDLCALC 68 10/28/2013   LDLDIRECT 145.8 08/15/2014   TRIG 210.0* 08/15/2014   CHOLHDL 5 08/15/2014       Assessment & Plan:  DM: moderate exacerbation Dyslipidemia: well-controlled Noncompliance with cbg recording: I'll work around this as best I can.  Patient is advised the following: Patient Instructions  check your blood sugar once a day.  vary the time of day when you check, between before the 3 meals, and at bedtime.  also check if you have symptoms of your blood sugar being too high or too low.  please keep a record of the readings and bring it to your next appointment here.  You can write it on any piece of paper.  please call us sooner if your blood sugar goes below 70, or if you have a lot of readings over 200.  Please come back for a follow-up appointment in 6 weeks Please increase the levemir to 35 units each morning.  blood and urine tests are being requested for you today.  We'll let you know about the results.

## 2014-08-16 DIAGNOSIS — M25512 Pain in left shoulder: Secondary | ICD-10-CM | POA: Diagnosis not present

## 2014-08-19 DIAGNOSIS — M19012 Primary osteoarthritis, left shoulder: Secondary | ICD-10-CM | POA: Diagnosis not present

## 2014-08-19 DIAGNOSIS — M25512 Pain in left shoulder: Secondary | ICD-10-CM | POA: Diagnosis not present

## 2014-09-05 ENCOUNTER — Other Ambulatory Visit: Payer: Self-pay | Admitting: Internal Medicine

## 2014-09-16 ENCOUNTER — Other Ambulatory Visit: Payer: Self-pay | Admitting: Adult Health

## 2014-09-16 MED ORDER — AMLODIPINE BESYLATE 5 MG PO TABS: 5.0000 mg | ORAL_TABLET | Freq: Every day | ORAL | Status: DC

## 2014-09-16 NOTE — Telephone Encounter (Signed)
Received faxed refill request from [facility]  [address]   Amlodipine 5mg  Once daily #90 with 3 refills  Last refill 10.4.15 MW was pt's previous PCP Now changed to Dr Vertell Novak per chart  1 refill given to pt with note to pharmacy to send future refill requests to Dr Doug Sou Will sign off

## 2014-09-17 ENCOUNTER — Ambulatory Visit (INDEPENDENT_AMBULATORY_CARE_PROVIDER_SITE_OTHER): Payer: 59 | Admitting: Internal Medicine

## 2014-09-17 ENCOUNTER — Encounter: Payer: Self-pay | Admitting: Internal Medicine

## 2014-09-17 VITALS — BP 138/82 | HR 85 | Temp 97.6°F | Resp 14 | Ht 61.0 in | Wt 151.8 lb

## 2014-09-17 DIAGNOSIS — M2550 Pain in unspecified joint: Secondary | ICD-10-CM

## 2014-09-17 DIAGNOSIS — E1165 Type 2 diabetes mellitus with hyperglycemia: Secondary | ICD-10-CM

## 2014-09-17 DIAGNOSIS — E785 Hyperlipidemia, unspecified: Secondary | ICD-10-CM

## 2014-09-17 DIAGNOSIS — F329 Major depressive disorder, single episode, unspecified: Secondary | ICD-10-CM

## 2014-09-17 DIAGNOSIS — I10 Essential (primary) hypertension: Secondary | ICD-10-CM

## 2014-09-17 DIAGNOSIS — IMO0002 Reserved for concepts with insufficient information to code with codable children: Secondary | ICD-10-CM

## 2014-09-17 DIAGNOSIS — F32A Depression, unspecified: Secondary | ICD-10-CM

## 2014-09-17 MED ORDER — PRAVASTATIN SODIUM 20 MG PO TABS: 20.0000 mg | ORAL_TABLET | Freq: Every day | ORAL | Status: DC

## 2014-09-17 MED ORDER — HYDROCODONE-ACETAMINOPHEN 5-325 MG PO TABS: 1.0000 | ORAL_TABLET | Freq: Four times a day (QID) | ORAL | Status: DC | PRN

## 2014-09-17 MED ORDER — VALSARTAN-HYDROCHLOROTHIAZIDE 160-12.5 MG PO TABS: 1.0000 | ORAL_TABLET | Freq: Two times a day (BID) | ORAL | Status: DC

## 2014-09-17 MED ORDER — AMLODIPINE BESYLATE 5 MG PO TABS: 5.0000 mg | ORAL_TABLET | Freq: Every day | ORAL | Status: DC

## 2014-09-17 MED ORDER — SERTRALINE HCL 100 MG PO TABS: ORAL_TABLET | ORAL | Status: DC

## 2014-09-17 NOTE — Progress Notes (Signed)
   Subjective:    Patient ID: [name], female    DOB: 1928-11-05, 81 y.o.   MRN: [id number]  HPI The patient is an 81 YO female who is coming in today to establish care. She does have PMH of DM, hyperlipidemia, HTN, PAD. She is doing well overall but her shoulder is really hurting. It keeps her up at night time and she is not able to sleep well. She is not taking her cholesterol medicine as it makes her muscles hurt. She is out of her hydrocodone but had taken one for the pain and it did fairly well. She denies chest pains, SOB, abdominal pains.  Review of Systems  Constitutional: Positive for activity change. Negative for chills, appetite change, fatigue and unexpected weight change.  HENT: Negative.   Respiratory: Negative for cough, chest tightness, shortness of breath and wheezing.   Cardiovascular: Negative for chest pain, palpitations and leg swelling.  Gastrointestinal: Negative for abdominal pain, diarrhea, constipation and abdominal distention.  Musculoskeletal: Positive for back pain and arthralgias. Negative for gait problem.  Neurological: Negative for dizziness, weakness, light-headedness and headaches.  Psychiatric/Behavioral: Negative.       Objective:   Physical Exam  Constitutional: She is oriented to person, place, and time. She appears well-developed and well-nourished.  HENT:  Head: Normocephalic and atraumatic.  Eyes: EOM are normal.  Neck: Normal range of motion.  Cardiovascular: Normal rate and regular rhythm.   Pulmonary/Chest: Effort normal and breath sounds normal.  Abdominal: Soft. She exhibits no distension. There is no tenderness.  Musculoskeletal: She exhibits no edema.  Neurological: She is alert and oriented to person, place, and time.  Skin: Skin is warm and dry.   Filed Vitals:   09/17/14 1100  BP: 138/82  Pulse: 85  Temp: 97.6 F (36.4 C)  TempSrc: Oral  Resp: 14  Height: 5\' 1"  (1.549 m)  Weight: 151 lb 12.8 oz (68.856 kg)  SpO2:  94%      Assessment & Plan:

## 2014-09-17 NOTE — Progress Notes (Signed)
Pre visit review using our clinic review tool, if applicable. No additional management support is needed unless otherwise documented below in the visit note. 

## 2014-09-17 NOTE — Patient Instructions (Signed)
We have refilled your medicines and have given you some of the hydrocodone that you can use before bedtime for your shoulder pain.   We will see you back in about 6 months to check on your blood pressure and cholesterol.   We will try a new cholesterol medicine called pravastatin which has less problems with muscle pains. Take 1 pill a day, this is the lowest dose. If you do well with it we can increase the dose in about 2-3 months.   Good luck with your shoulder surgery and please feel free to call us with any problems or questions.

## 2014-09-18 NOTE — Assessment & Plan Note (Signed)
She is doing well on zoloft. Will refill today as mood seems good. Has been on for some time and if she wants we could try her off the medicine since only one episode of depression.

## 2014-09-18 NOTE — Assessment & Plan Note (Signed)
Is working with endocrinology to help bring this under better control. She is still taking levemir once a day and on ARB. Denies any hypoglycemia and mostly notices that sugars are high when not normal.

## 2014-09-18 NOTE — Assessment & Plan Note (Signed)
BP well controlled now on amlodipine, diovan/hctz. Reviewed recent BMP from visit and will not check today.

## 2014-09-18 NOTE — Assessment & Plan Note (Signed)
Goal <70. Was at goal on lipitor but muscle aches are very bad and she has stopped. Will have her try pravastatin to see if she is able to tolerate. Rx pravastatin 20 mg daily. If able to tolerate in 2-3 months can increase to 40 mg daily. Recheck lipid panel in 6 months.

## 2014-09-18 NOTE — Assessment & Plan Note (Signed)
Seeing orthopedics for her shoulder, given rx for hydrocodone #30 for daily prn to be taken prior to bed so that she can sleep better. She is likely going to need surgery on her shoulder.

## 2014-09-23 ENCOUNTER — Ambulatory Visit (INDEPENDENT_AMBULATORY_CARE_PROVIDER_SITE_OTHER): Payer: 59 | Admitting: Endocrinology

## 2014-09-23 ENCOUNTER — Ambulatory Visit: Payer: Medicare Other | Admitting: Endocrinology

## 2014-09-23 ENCOUNTER — Encounter: Payer: Self-pay | Admitting: Endocrinology

## 2014-09-23 VITALS — BP 128/64 | HR 74 | Temp 98.1°F | Ht 61.0 in | Wt 152.0 lb

## 2014-09-23 DIAGNOSIS — E1042 Type 1 diabetes mellitus with diabetic polyneuropathy: Secondary | ICD-10-CM

## 2014-09-23 NOTE — Patient Instructions (Addendum)
check your blood sugar once a day.  vary the time of day when you check, between before the 3 meals, and at bedtime.  also check if you have symptoms of your blood sugar being too high or too low.  please keep a record of the readings and bring it to your next appointment here.  You can write it on any piece of paper.  please call us sooner if your blood sugar goes below 70, or if you have a lot of readings over 200.  Please come back for a follow-up appointment in 6 weeks.  Please increase the levemir to 50 units each morning.

## 2014-09-23 NOTE — Progress Notes (Signed)
Subjective:    Patient ID: [name], female    DOB: Oct 10, 1928, 81 y.o.   MRN: [id number]  HPI Pt returns for f/u of diabetes mellitus: DM type: Insulin-requiring type 2 Dx'ed: 7341 Complications: PAD Therapy: insulin since mid-2015.  GDM: never DKA: never Severe hypoglycemia: never Pancreatitis: never Other: she agrees to take insulin only qd Interval history: no cbg record, but states cbg's are in the mid-100's.  It is in general higher as the day goes on.  pt states she feels well in general.  She says she never misses the insulin.   Past Medical History  Diagnosis Date  . Hyperlipidemia   . Hypertension   . Varicose veins   . SOBOE (shortness of breath on exertion)   . Diabetes mellitus without complication     Past Surgical History  Procedure Laterality Date  . Appendectomy  50 years ago    History   Social History  . Marital Status: Single    Spouse Name: N/A    Number of Children: N/A  . Years of Education: N/A   Occupational History  . Not on file.   Social History Main Topics  . Smoking status: Former Smoker -- 0.30 packs/day for 20 years    Types: Cigarettes    Quit date: 09/05/1985  . Smokeless tobacco: Never Used  . Alcohol Use: No  . Drug Use: No  . Sexual Activity: Not on file   Other Topics Concern  . Not on file   Social History Narrative    Current Outpatient Prescriptions on File Prior to Visit  Medication Sig Dispense Refill  . acetaminophen (TYLENOL) 500 MG tablet As directed with food when needed for arthritis pain/headache/back pain    . amLODipine (NORVASC) 5 MG tablet Take 1 tablet (5 mg total) by mouth daily. 90 tablet 3  . aspirin 81 MG tablet Take 81 mg by mouth every morning.     . Calcium Carbonate-Vitamin D (CALCIUM 600+D) 600-400 MG-UNIT per tablet Take 1 tablet by mouth daily.    . chlorpheniramine (CHLOR-TRIMETON) 4 MG tablet Take 4 mg by mouth every 6 (six) hours as needed (drippy nose/throat drainage).     Marland Kitchen  dextromethorphan-guaiFENesin (MUCINEX DM) 30-600 MG per 12 hr tablet Take 1-2 tablets by mouth every 12 (twelve) hours as needed.    . famotidine (PEPCID) 20 MG tablet Take 20 mg by mouth at bedtime.    Marland Kitchen glucose blood (PRODIGY TEST) test strip Ck blood sugars once daily or as instructed 50 each 12  . HYDROcodone-acetaminophen (NORCO) 5-325 MG per tablet Take 1 tablet by mouth every 6 (six) hours as needed. 30 tablet 0  . Insulin Detemir (LEVEMIR FLEXTOUCH) 100 UNIT/ML Pen Inject 35 Units into the skin every morning. And pen needles 1/day (Patient taking differently: Inject 50 Units into the skin every morning. And pen needles 1/day) 15 mL 11  . Magnesium 250 MG TABS Take 1 tablet by mouth daily.    . magnesium hydroxide (MILK OF MAGNESIA) 400 MG/5ML suspension Per bottle as needed for constipation    . meloxicam (MOBIC) 15 MG tablet Take 1 tablet (15 mg total) by mouth daily. 7 tablet 0  . Multiple Vitamins-Minerals (CENTRUM SILVER PO) Take 1 tablet by mouth daily.      . pravastatin (PRAVACHOL) 20 MG tablet Take 1 tablet (20 mg total) by mouth daily. 90 tablet 3  . sertraline (ZOLOFT) 100 MG tablet TAKE 1 TABLET BY MOUTH DAILY 90 tablet 3  .  simethicone (GAS-X) 80 MG chewable tablet Take with meals as needed    . Sodium Chloride-Sodium Bicarb (AYR SALINE NASAL RINSE NA) Place into the nose. As needed    . valsartan-hydrochlorothiazide (DIOVAN HCT) 160-12.5 MG per tablet Take 1 tablet by mouth 2 (two) times daily. 180 tablet 3   No current facility-administered medications on file prior to visit.    Allergies  Allergen Reactions  . Albuterol   . Doxycycline Nausea And Vomiting    Family History  Problem Relation Age of Onset  . Hypertension Mother   . Diabetes Mother   . Diabetes Sister   . Heart disease Sister   . Heart disease Father   . Diabetes Brother   . Diabetes Daughter   . Diabetes Son   . Diabetes Daughter   . Diabetes Son     BP 128/64 mmHg  Pulse 74  Temp(Src)  98.1 F (36.7 C) (Oral)  Ht 5\' 1"  (1.549 m)  Wt 152 lb (68.947 kg)  BMI 28.74 kg/m2  SpO2 98%  Review of Systems She denies hypoglycemia and weight change.    Objective:   Physical Exam VITAL SIGNS:  See vs page GENERAL: no distress Pulses: dorsalis pedis intact bilat.   MSK: no deformity of the feet CV: no leg edema Skin:  no ulcer on the feet.  normal color and temp on the feet. Neuro: sensation is intact to touch on the feet.         Assessment & Plan:  DM: mild exacerbation. Noncompliance with cbg recording: I'll work around this as best I can.   Patient is advised the following: Patient Instructions  check your blood sugar once a day.  vary the time of day when you check, between before the 3 meals, and at bedtime.  also check if you have symptoms of your blood sugar being too high or too low.  please keep a record of the readings and bring it to your next appointment here.  You can write it on any piece of paper.  please call us sooner if your blood sugar goes below 70, or if you have a lot of readings over 200.  Please come back for a follow-up appointment in 6 weeks.  Please increase the levemir to 50 units each morning.

## 2014-09-30 ENCOUNTER — Other Ambulatory Visit: Payer: Self-pay | Admitting: Internal Medicine

## 2014-10-15 ENCOUNTER — Telehealth: Payer: Self-pay | Admitting: Internal Medicine

## 2014-10-15 NOTE — Telephone Encounter (Signed)
Would you like to prescribe a topical pain medication? Please advise, thanks.

## 2014-10-15 NOTE — Telephone Encounter (Signed)
Patient is requesting a topical compound cream for pain management

## 2014-10-16 MED ORDER — DICLOFENAC SODIUM 1 % TD GEL: 2.0000 g | Freq: Three times a day (TID) | TRANSDERMAL | Status: AC | PRN

## 2014-10-16 NOTE — Telephone Encounter (Signed)
Patient aware and will go pick up 

## 2014-10-16 NOTE — Telephone Encounter (Signed)
Have sent in voltaren gel but please let her know that it may be more expensive.

## 2014-10-28 ENCOUNTER — Telehealth: Payer: Self-pay | Admitting: Endocrinology

## 2014-10-28 DIAGNOSIS — E1042 Type 1 diabetes mellitus with diabetic polyneuropathy: Secondary | ICD-10-CM

## 2014-10-28 MED ORDER — INSULIN DETEMIR 100 UNIT/ML FLEXPEN: 50.0000 [IU] | PEN_INJECTOR | SUBCUTANEOUS | Status: DC

## 2014-10-28 NOTE — Telephone Encounter (Signed)
Pt needs updated rx sent in for her levemir to dosing of 50 u daily

## 2014-10-28 NOTE — Telephone Encounter (Signed)
Rx sent per request. 

## 2014-11-06 ENCOUNTER — Encounter: Payer: Self-pay | Admitting: Dietician

## 2014-11-06 ENCOUNTER — Ambulatory Visit (INDEPENDENT_AMBULATORY_CARE_PROVIDER_SITE_OTHER): Payer: 59 | Admitting: Endocrinology

## 2014-11-06 ENCOUNTER — Encounter: Payer: 59 | Attending: Endocrinology | Admitting: Dietician

## 2014-11-06 VITALS — Ht 60.0 in | Wt 157.0 lb

## 2014-11-06 VITALS — BP 132/70 | HR 84 | Temp 97.8°F | Ht 61.0 in | Wt 151.0 lb

## 2014-11-06 DIAGNOSIS — E1165 Type 2 diabetes mellitus with hyperglycemia: Secondary | ICD-10-CM

## 2014-11-06 DIAGNOSIS — Z794 Long term (current) use of insulin: Secondary | ICD-10-CM | POA: Insufficient documentation

## 2014-11-06 DIAGNOSIS — Z713 Dietary counseling and surveillance: Secondary | ICD-10-CM | POA: Insufficient documentation

## 2014-11-06 DIAGNOSIS — E1042 Type 1 diabetes mellitus with diabetic polyneuropathy: Secondary | ICD-10-CM

## 2014-11-06 DIAGNOSIS — IMO0002 Reserved for concepts with insufficient information to code with codable children: Secondary | ICD-10-CM

## 2014-11-06 LAB — HEMOGLOBIN A1C: Hgb A1c MFr Bld: 9.2 % — ABNORMAL HIGH (ref 4.6–6.5)

## 2014-11-06 NOTE — Patient Instructions (Addendum)
check your blood sugar once a day.  vary the time of day when you check, between before the 3 meals, and at bedtime.  also check if you have symptoms of your blood sugar being too high or too low.  please keep a record of the readings and bring it to your next appointment here.  You can write it on any piece of paper.  please call us sooner if your blood sugar goes below 70, or if you have a lot of readings over 200.  Please come back for a follow-up appointment in 3 months.

## 2014-11-06 NOTE — Progress Notes (Signed)
Subjective:    Patient ID: [name], female    DOB: 1928-10-30, 81 y.o.   MRN: [id number]  HPI Pt returns for f/u of diabetes mellitus: DM type: Insulin-requiring type 2 Dx'ed: 0272 Complications: PAD Therapy: insulin since mid-2015.  GDM: never DKA: never Severe hypoglycemia: never Pancreatitis: never Other: she agrees to take insulin only qd.   Interval history:  pt states she feels well in general.  She says she never misses the insulin.  She takes 50 units qam.  she brings a record of her cbg's which i have reviewed today.  It varies from 108-160.  It is in general higher as the day goes on.   Past Medical History  Diagnosis Date  . Hyperlipidemia   . Hypertension   . Varicose veins   . SOBOE (shortness of breath on exertion)   . Diabetes mellitus without complication     Past Surgical History  Procedure Laterality Date  . Appendectomy  50 years ago    History   Social History  . Marital Status: Single    Spouse Name: N/A  . Number of Children: N/A  . Years of Education: N/A   Occupational History  . Not on file.   Social History Main Topics  . Smoking status: Former Smoker -- 0.30 packs/day for 20 years    Types: Cigarettes    Quit date: 09/05/1985  . Smokeless tobacco: Never Used  . Alcohol Use: No  . Drug Use: No  . Sexual Activity: Not on file   Other Topics Concern  . Not on file   Social History Narrative    Current Outpatient Prescriptions on File Prior to Visit  Medication Sig Dispense Refill  . acetaminophen (TYLENOL) 500 MG tablet As directed with food when needed for arthritis pain/headache/back pain    . amLODipine (NORVASC) 5 MG tablet Take 1 tablet (5 mg total) by mouth daily. (Patient not taking: Reported on 11/06/2014) 90 tablet 3  . aspirin 81 MG tablet Take 81 mg by mouth every morning.     . Calcium Carbonate-Vitamin D (CALCIUM 600+D) 600-400 MG-UNIT per tablet Take 1 tablet by mouth daily.    . chlorpheniramine  (CHLOR-TRIMETON) 4 MG tablet Take 4 mg by mouth every 6 (six) hours as needed (drippy nose/throat drainage).     Marland Kitchen dextromethorphan-guaiFENesin (MUCINEX DM) 30-600 MG per 12 hr tablet Take 1-2 tablets by mouth every 12 (twelve) hours as needed.    . diclofenac sodium (VOLTAREN) 1 % GEL Apply 2 g topically 3 (three) times daily as needed. 100 g 1  . famotidine (PEPCID) 20 MG tablet Take 20 mg by mouth at bedtime.    Marland Kitchen glucose blood (PRODIGY TEST) test strip Ck blood sugars once daily or as instructed 50 each 12  . HYDROcodone-acetaminophen (NORCO) 5-325 MG per tablet Take 1 tablet by mouth every 6 (six) hours as needed. 30 tablet 0  . Insulin Detemir (LEVEMIR FLEXTOUCH) 100 UNIT/ML Pen Inject 50 Units into the skin every morning. And pen needles 1/day (Patient taking differently: Inject 60 Units into the skin every morning. And pen needles 1/day) 15 mL 5  . Magnesium 250 MG TABS Take 1 tablet by mouth daily.    . magnesium hydroxide (MILK OF MAGNESIA) 400 MG/5ML suspension Per bottle as needed for constipation    . meloxicam (MOBIC) 15 MG tablet Take 1 tablet (15 mg total) by mouth daily. (Patient not taking: Reported on 11/06/2014) 7 tablet 0  . Multiple Vitamins-Minerals (  CENTRUM SILVER PO) Take 1 tablet by mouth daily.      . pravastatin (PRAVACHOL) 20 MG tablet Take 1 tablet (20 mg total) by mouth daily. (Patient not taking: Reported on 11/06/2014) 90 tablet 3  . sertraline (ZOLOFT) 100 MG tablet TAKE 1 TABLET BY MOUTH DAILY 90 tablet 3  . simethicone (GAS-X) 80 MG chewable tablet Take with meals as needed    . Sodium Chloride-Sodium Bicarb (AYR SALINE NASAL RINSE NA) Place into the nose. As needed    . valsartan-hydrochlorothiazide (DIOVAN HCT) 160-12.5 MG per tablet Take 1 tablet by mouth 2 (two) times daily. 180 tablet 3   No current facility-administered medications on file prior to visit.    Allergies  Allergen Reactions  . Albuterol   . Doxycycline Nausea And Vomiting    Family History   Problem Relation Age of Onset  . Hypertension Mother   . Diabetes Mother   . Diabetes Sister   . Heart disease Sister   . Heart disease Father   . Diabetes Brother   . Diabetes Daughter   . Diabetes Son   . Diabetes Daughter   . Diabetes Son     BP 132/70 mmHg  Pulse 84  Temp(Src) 97.8 F (36.6 C) (Oral)  Ht 5\' 1"  (1.549 m)  Wt 151 lb (68.493 kg)  BMI 28.55 kg/m2  SpO2 94%  Review of Systems She denies hypoglycemia, but she has weight gain.    Objective:   Physical Exam VITAL SIGNS:  See vs page GENERAL: no distress Pulses: dorsalis pedis intact bilat.   MSK: no deformity of the feet CV: no leg edema Skin:  no ulcer on the feet.  normal color and temp on the feet. Neuro: sensation is intact to touch on the feet.    Lab Results  Component Value Date   HGBA1C 9.2* 11/06/2014       Assessment & Plan:  DM: severe exacerbation Weight gain, new.  Pt is advised to re-lose.    Patient is advised the following: Patient Instructions  check your blood sugar once a day.  vary the time of day when you check, between before the 3 meals, and at bedtime.  also check if you have symptoms of your blood sugar being too high or too low.  please keep a record of the readings and bring it to your next appointment here.  You can write it on any piece of paper.  please call us sooner if your blood sugar goes below 70, or if you have a lot of readings over 200.  Please come back for a follow-up appointment in 3 months.

## 2014-11-06 NOTE — Patient Instructions (Signed)
Plan:  Aim for 30 Carb Choices per meal (30 grams) +/- 1 either way  Aim for 0-2 Carbs per snack if hungry  Include protein in moderation with your meals and snacks Consider reading food labels for Total Carbohydrate and Fat Grams of foods Consider  increasing your activity level by walking for 30 minutes daily as tolerated Consider checking BG at alternate times per day as directed by MD  Consider taking medication as directed by MD

## 2014-11-06 NOTE — Progress Notes (Signed)
Medical Nutrition Therapy:  Appt start time: 4098 end time:  1191.  Assessment:  Primary concerns today: Patient is here today with her daughter.  She wishes to try to prevent further weight loss and better control her diabetes.  She checks her blood sugar every morning.  CBG's 108-145.  States that she has had diabetes since age 81.  Patient lives alone.  She does her own shopping and cooking.  Results for [name], [name] (MRN [id number]) as of 11/06/2014 11:58  Ref. Range 03/04/2014 09:51 03/14/2014 14:25 05/07/2014 00:00 05/15/2014 10:17 08/15/2014 09:57  Cholesterol Latest Range: 0-200 mg/dL     226 (H)  Triglycerides Latest Range: 0.0-149.0 mg/dL     210.0 (H)  HDL Latest Range: >39.00 mg/dL     41.70  Direct LDL Latest Units: mg/dL     145.8  VLDL Latest Range: 0.0-40.0 mg/dL     42.0 (H)  Total CHOL/HDL Ratio No range found     5  Hemoglobin A1C Latest Range: 4.6-6.5 %    8.6 (H) 9.3 (H)  Glucose Latest Range: 70-99 mg/dL     311 (H)  Fructosamine Latest Range: 190-270 umol/L  353 (H)      Preferred Learning Style:   No preference indicated   Learning Readiness:   Ready  MEDICATIONS: includes Levemir     DIETARY INTAKE: Dislikes milk.  Uses butter and Smart Balance margarine.  Pound cake about 12 times per month (about 45 grams carbohydrates per serving)  2-3 meals and 2-3 snacks per day  24-hr recall:  B ( AM): eggs, 1 slice [employer] toast/grits, applebutter, bacon/sausage or oatmeal and canned fruit, Coffee, cereal with milk   Snk ( AM): orange or apple or canned fruit  L ( PM): leftovers or snacks Snk ( PM): pistachios or other nuts, cookies D ( PM): vegetables, meat, occasional starch Snk ( PM): popsicle or ice cream or fruit or tomato V-8 juice Beverages: diet soda, sweet tea occasionally, water, coffee with honey or 1 1/2 tsp sugar once per day  Usual physical activity: walks 3-4 x per week, errands, does all of her own housekeeping.  Estimated energy needs: 1400  calories 158 g carbohydrates 88 g protein 47 g fat  Progress Towards Goal(s):  In progress.   Nutritional Diagnosis:  NB-1.1 Food and nutrition-related knowledge deficit As related to balance of carbohydrates, protein, and fat.  As evidenced by diet hx.    Intervention:  Nutrition counseling and diabetes education initiated. Discussed Carb Counting by food group as method of portion control, reading food labels, and benefits of increased activity. Also discussed basic physiology of Diabetes, target BG ranges pre and post meals, and A1c.   Plan:  Aim for 30 Carb Choices per meal (30 grams) +/- 1 either way  Aim for 0-2 Carbs per snack if hungry  Include protein in moderation with your meals and snacks Consider reading food labels for Total Carbohydrate and Fat Grams of foods Consider  increasing your activity level by walking for 30 minutes daily as tolerated Consider checking BG at alternate times per day as directed by MD  Consider taking medication as directed by MD  Teaching Method Utilized:  Visual Auditory  Handouts given during visit include:  My plate placemat  Label reading  Snack list  Meal plan card  CBG log sheets with ADA blood sugar goals.  Barriers to learning/adherence to lifestyle change: staying motivated  Demonstrated degree of understanding via:  Teach Back   Monitoring/Evaluation:  Dietary intake, exercise, label reading, and body weight prn.

## 2014-11-14 ENCOUNTER — Telehealth: Payer: Self-pay | Admitting: Endocrinology

## 2014-11-14 NOTE — Telephone Encounter (Signed)
[name] from [facility] call about patient [name], he need last office note for patient. Phone # [phone]  Fax # [phone]

## 2014-11-17 ENCOUNTER — Other Ambulatory Visit: Payer: Self-pay | Admitting: Internal Medicine

## 2014-11-17 NOTE — Telephone Encounter (Signed)
Ov notes faxed per request.

## 2015-01-19 ENCOUNTER — Telehealth: Payer: Self-pay | Admitting: Internal Medicine

## 2015-01-19 DIAGNOSIS — E109 Type 1 diabetes mellitus without complications: Secondary | ICD-10-CM

## 2015-01-19 NOTE — Telephone Encounter (Signed)
Requesting referral to see dr Loanne Drilling. Also asking for retro referral for 11/06/2014.

## 2015-01-19 NOTE — Telephone Encounter (Signed)
Done

## 2015-01-19 NOTE — Telephone Encounter (Signed)
Patient aware that referral has been placed.  

## 2015-02-06 ENCOUNTER — Encounter: Payer: Self-pay | Admitting: Endocrinology

## 2015-02-06 ENCOUNTER — Ambulatory Visit (INDEPENDENT_AMBULATORY_CARE_PROVIDER_SITE_OTHER): Payer: 59 | Admitting: Endocrinology

## 2015-02-06 VITALS — BP 134/80 | HR 86 | Temp 98.2°F | Ht 61.0 in | Wt 155.0 lb

## 2015-02-06 DIAGNOSIS — E1042 Type 1 diabetes mellitus with diabetic polyneuropathy: Secondary | ICD-10-CM

## 2015-02-06 LAB — HEMOGLOBIN A1C: HEMOGLOBIN A1C: 8.5 % — AB (ref 4.6–6.5)

## 2015-02-06 MED ORDER — INSULIN DETEMIR 100 UNIT/ML FLEXPEN: 70.0000 [IU] | PEN_INJECTOR | SUBCUTANEOUS | Status: DC

## 2015-02-06 NOTE — Progress Notes (Signed)
Subjective:    Patient ID: [name], female    DOB: May 20, 1929, 81 y.o.   MRN: [id number]  HPI Pt returns for f/u of diabetes mellitus: DM type: Insulin-requiring type 2 Dx'ed: 0277 Complications: PAD Therapy: insulin since mid-2015.  GDM: never DKA: never Severe hypoglycemia: never Pancreatitis: never Other: she agrees to take insulin only qd.   Interval history:  pt states she feels well in general.  She says she never misses the insulin, but she takes only 53 units qam.  she brings a record of her cbg's which i have reviewed today.  It varies from 115-175.  She checks in am only. Past Medical History  Diagnosis Date  . Hyperlipidemia   . Hypertension   . Varicose veins   . SOBOE (shortness of breath on exertion)   . Diabetes mellitus without complication     Past Surgical History  Procedure Laterality Date  . Appendectomy  50 years ago    History   Social History  . Marital Status: Single    Spouse Name: N/A  . Number of Children: N/A  . Years of Education: N/A   Occupational History  . Not on file.   Social History Main Topics  . Smoking status: Former Smoker -- 0.30 packs/day for 20 years    Types: Cigarettes    Quit date: 09/05/1985  . Smokeless tobacco: Never Used  . Alcohol Use: No  . Drug Use: No  . Sexual Activity: Not on file   Other Topics Concern  . Not on file   Social History Narrative    Current Outpatient Prescriptions on File Prior to Visit  Medication Sig Dispense Refill  . acetaminophen (TYLENOL) 500 MG tablet As directed with food when needed for arthritis pain/headache/back pain    . amLODipine (NORVASC) 5 MG tablet Take 1 tablet (5 mg total) by mouth daily. 90 tablet 3  . aspirin 81 MG tablet Take 81 mg by mouth every morning.     . Calcium Carbonate-Vitamin D (CALCIUM 600+D) 600-400 MG-UNIT per tablet Take 1 tablet by mouth daily.    . chlorpheniramine (CHLOR-TRIMETON) 4 MG tablet Take 4 mg by mouth every 6 (six) hours as  needed (drippy nose/throat drainage).     Marland Kitchen dextromethorphan-guaiFENesin (MUCINEX DM) 30-600 MG per 12 hr tablet Take 1-2 tablets by mouth every 12 (twelve) hours as needed.    . diclofenac sodium (VOLTAREN) 1 % GEL Apply 2 g topically 3 (three) times daily as needed. 100 g 1  . famotidine (PEPCID) 20 MG tablet Take 20 mg by mouth at bedtime.    Marland Kitchen glucose blood (PRODIGY TEST) test strip Ck blood sugars once daily or as instructed 50 each 12  . HYDROcodone-acetaminophen (NORCO) 5-325 MG per tablet Take 1 tablet by mouth every 6 (six) hours as needed. 30 tablet 0  . Magnesium 250 MG TABS Take 1 tablet by mouth daily.    . magnesium hydroxide (MILK OF MAGNESIA) 400 MG/5ML suspension Per bottle as needed for constipation    . meloxicam (MOBIC) 15 MG tablet Take 1 tablet (15 mg total) by mouth daily. 7 tablet 0  . Multiple Vitamins-Minerals (CENTRUM SILVER PO) Take 1 tablet by mouth daily.      . pravastatin (PRAVACHOL) 20 MG tablet Take 1 tablet (20 mg total) by mouth daily. 90 tablet 3  . sertraline (ZOLOFT) 100 MG tablet TAKE 1 TABLET BY MOUTH DAILY 90 tablet 3  . simethicone (GAS-X) 80 MG chewable tablet Take  with meals as needed    . Sodium Chloride-Sodium Bicarb (AYR SALINE NASAL RINSE NA) Place into the nose. As needed    . valsartan-hydrochlorothiazide (DIOVAN HCT) 160-12.5 MG per tablet Take 1 tablet by mouth 2 (two) times daily. 180 tablet 3   No current facility-administered medications on file prior to visit.    Allergies  Allergen Reactions  . Albuterol   . Doxycycline Nausea And Vomiting    Family History  Problem Relation Age of Onset  . Hypertension Mother   . Diabetes Mother   . Diabetes Sister   . Heart disease Sister   . Heart disease Father   . Diabetes Brother   . Diabetes Daughter   . Diabetes Son   . Diabetes Daughter   . Diabetes Son     BP 134/80 mmHg  Pulse 86  Temp(Src) 98.2 F (36.8 C) (Oral)  Ht 5\' 1"  (1.549 m)  Wt 155 lb (70.308 kg)  BMI 29.30  kg/m2  SpO2 97%    Review of Systems She denies hypoglycemia     Objective:   Physical Exam VITAL SIGNS:  See vs page GENERAL: no distress Pulses: dorsalis pedis intact bilat.   MSK: no deformity of the feet CV: no leg edema Skin:  no ulcer on the feet.  normal color and temp on the feet. Neuro: sensation is intact to touch on the feet   Lab Results  Component Value Date   HGBA1C 8.5* 02/06/2015       Assessment & Plan:  DM: she needs increased rx Noncompliance with cbg monitoring and insulin dosing: I'll work around this as best I can.  Patient is advised the following: Patient Instructions  check your blood sugar once a day.  vary the time of day when you check, between before the 3 meals, and at bedtime.  also check if you have symptoms of your blood sugar being too high or too low.  please keep a record of the readings and bring it to your next appointment here.  You can write it on any piece of paper.  please call us sooner if your blood sugar goes below 70, or if you have a lot of readings over 200.  It is really important to check at different times of day.   Please come back for a follow-up appointment in 3 months.   blood tests are requested for you today.  We'll let you know about the results.   Please increase the insulin to 70 units each morning.

## 2015-02-06 NOTE — Patient Instructions (Addendum)
check your blood sugar once a day.  vary the time of day when you check, between before the 3 meals, and at bedtime.  also check if you have symptoms of your blood sugar being too high or too low.  please keep a record of the readings and bring it to your next appointment here.  You can write it on any piece of paper.  please call us sooner if your blood sugar goes below 70, or if you have a lot of readings over 200.  It is really important to check at different times of day.   Please come back for a follow-up appointment in 3 months.   blood tests are requested for you today.  We'll let you know about the results.   Please increase the insulin to 70 units each morning.

## 2015-03-20 ENCOUNTER — Other Ambulatory Visit (INDEPENDENT_AMBULATORY_CARE_PROVIDER_SITE_OTHER): Payer: 59

## 2015-03-20 ENCOUNTER — Encounter: Payer: Self-pay | Admitting: Internal Medicine

## 2015-03-20 ENCOUNTER — Ambulatory Visit (INDEPENDENT_AMBULATORY_CARE_PROVIDER_SITE_OTHER): Payer: 59 | Admitting: Internal Medicine

## 2015-03-20 VITALS — BP 160/82 | HR 94 | Temp 98.3°F | Resp 18 | Ht 61.0 in | Wt 155.0 lb

## 2015-03-20 DIAGNOSIS — G2581 Restless legs syndrome: Secondary | ICD-10-CM

## 2015-03-20 DIAGNOSIS — M5441 Lumbago with sciatica, right side: Secondary | ICD-10-CM

## 2015-03-20 DIAGNOSIS — E119 Type 2 diabetes mellitus without complications: Secondary | ICD-10-CM | POA: Diagnosis not present

## 2015-03-20 LAB — CBC
HCT: 37.6 % (ref 36.0–46.0)
Hemoglobin: 12.2 g/dL (ref 12.0–15.0)
MCHC: 32.3 g/dL (ref 30.0–36.0)
MCV: 83.3 fl (ref 78.0–100.0)
Platelets: 159 10*3/uL (ref 150.0–400.0)
RBC: 4.52 Mil/uL (ref 3.87–5.11)
RDW: 16.8 % — ABNORMAL HIGH (ref 11.5–15.5)
WBC: 7.5 10*3/uL (ref 4.0–10.5)

## 2015-03-20 LAB — FERRITIN: Ferritin: 61.7 ng/mL (ref 10.0–291.0)

## 2015-03-20 MED ORDER — GABAPENTIN 100 MG PO CAPS: 100.0000 mg | ORAL_CAPSULE | Freq: Every day | ORAL | Status: DC

## 2015-03-20 NOTE — Assessment & Plan Note (Signed)
Will try gabapentin for pain relief. 100 mg QHS and increase to 200 mg qhs if needed. Checking CBC and ferritin to rule out iron deficiency with her leg symptoms as well.

## 2015-03-20 NOTE — Progress Notes (Signed)
Pre visit review using our clinic review tool, if applicable. No additional management support is needed unless otherwise documented below in the visit note. 

## 2015-03-20 NOTE — Patient Instructions (Signed)
We have sent in a nerve medicine called gabapentin that will help with the back pain and may help with the legs. You can take 1 pill at bedtime for the first 3 days then if you are not having enough relief you can increase to 2 pills at bed time.   We will check the lab work and call you back about the results.

## 2015-03-20 NOTE — Progress Notes (Signed)
   Subjective:    Patient ID: [name], female    DOB: 1929-07-02, 81 y.o.   MRN: [id number]  HPI The patient is an 81 YO female coming in for anxiety and some rash. The rash started last week and she thought that it was nerves. Since that time without intervention the rash disappeared. She is now doing well and not feeling anxious. No chest pains or SOB. No new detergents or soaps. She is having some back pain. This flares every once in a while. In the middle of the back and does not radiate into the legs.   Review of Systems  Constitutional: Negative for chills, appetite change, fatigue and unexpected weight change.  HENT: Negative.   Respiratory: Negative for cough, chest tightness, shortness of breath and wheezing.   Cardiovascular: Negative for chest pain, palpitations and leg swelling.  Gastrointestinal: Negative for abdominal pain, diarrhea, constipation and abdominal distention.  Musculoskeletal: Positive for back pain and arthralgias. Negative for gait problem.  Neurological: Negative for dizziness, weakness, light-headedness and headaches.  Psychiatric/Behavioral: Negative.       Objective:   Physical Exam  Constitutional: She is oriented to person, place, and time. She appears well-developed and well-nourished.  HENT:  Head: Normocephalic and atraumatic.  Eyes: EOM are normal.  Neck: Normal range of motion.  Cardiovascular: Normal rate and regular rhythm.   Pulmonary/Chest: Effort normal and breath sounds normal.  Abdominal: Soft. She exhibits no distension. There is no tenderness.  Musculoskeletal: She exhibits no edema.  Pain over the spine lumbar region.   Neurological: She is alert and oriented to person, place, and time.  Skin: Skin is warm and dry.   Filed Vitals:   03/20/15 1400  BP: 160/82  Pulse: 94  Temp: 98.3 F (36.8 C)  TempSrc: Oral  Resp: 18  Height: 5\' 1"  (1.549 m)  Weight: 155 lb (70.308 kg)  SpO2: 98%      Assessment & Plan:

## 2015-03-23 ENCOUNTER — Encounter: Payer: Self-pay | Admitting: Internal Medicine

## 2015-05-12 ENCOUNTER — Ambulatory Visit: Payer: 59 | Admitting: Endocrinology

## 2015-05-12 LAB — HM DIABETES EYE EXAM

## 2015-05-13 ENCOUNTER — Encounter: Payer: Self-pay | Admitting: Endocrinology

## 2015-05-18 ENCOUNTER — Encounter: Payer: Self-pay | Admitting: Endocrinology

## 2015-05-18 ENCOUNTER — Ambulatory Visit (INDEPENDENT_AMBULATORY_CARE_PROVIDER_SITE_OTHER): Payer: 59 | Admitting: Endocrinology

## 2015-05-18 VITALS — BP 134/64 | HR 93 | Temp 98.0°F | Ht 61.0 in | Wt 153.0 lb

## 2015-05-18 DIAGNOSIS — E1165 Type 2 diabetes mellitus with hyperglycemia: Secondary | ICD-10-CM | POA: Diagnosis not present

## 2015-05-18 DIAGNOSIS — R21 Rash and other nonspecific skin eruption: Secondary | ICD-10-CM

## 2015-05-18 DIAGNOSIS — IMO0002 Reserved for concepts with insufficient information to code with codable children: Secondary | ICD-10-CM

## 2015-05-18 LAB — POCT GLYCOSYLATED HEMOGLOBIN (HGB A1C): HEMOGLOBIN A1C: 9.4

## 2015-05-18 MED ORDER — TRIAMCINOLONE ACETONIDE 0.1 % EX CREA: 1.0000 "application " | TOPICAL_CREAM | Freq: Four times a day (QID) | CUTANEOUS | Status: DC

## 2015-05-18 MED ORDER — SITAGLIPTIN PHOSPHATE 100 MG PO TABS: 100.0000 mg | ORAL_TABLET | Freq: Every day | ORAL | Status: DC

## 2015-05-18 NOTE — Progress Notes (Signed)
Subjective:    Patient ID: [name], female    DOB: [birth year], 81 y.o.   MRN: [id number]  HPI Pt returns for f/u of diabetes mellitus: DM type: Insulin-requiring type 2 Dx'ed: 7510 Complications: PAD Therapy: insulin since mid-2015.  GDM: never.   DKA: never Severe hypoglycemia: never.   Pancreatitis: never.   Other: she agrees to take insulin only qd.   Interval history:  Pt says she never misses the insulin.  no cbg record, but states cbg's vary from 150-200's.  There is no trend throughout the day.  She has 1 month of slight rash throughout the body, and assoc itching.  She thinks this is due to the increased dosage of insulin.  Past Medical History  Diagnosis Date  . Hyperlipidemia   . Hypertension   . Varicose veins   . SOBOE (shortness of breath on exertion)   . Diabetes mellitus without complication     Past Surgical History  Procedure Laterality Date  . Appendectomy  50 years ago    Social History   Social History  . Marital Status: Single    Spouse Name: N/A  . Number of Children: N/A  . Years of Education: N/A   Occupational History  . Not on file.   Social History Main Topics  . Smoking status: Former Smoker -- 0.30 packs/day for 20 years    Types: Cigarettes    Quit date: 09/05/1985  . Smokeless tobacco: Never Used  . Alcohol Use: No  . Drug Use: No  . Sexual Activity: Not on file   Other Topics Concern  . Not on file   Social History Narrative    Current Outpatient Prescriptions on File Prior to Visit  Medication Sig Dispense Refill  . acetaminophen (TYLENOL) 500 MG tablet As directed with food when needed for arthritis pain/headache/back pain    . amLODipine (NORVASC) 5 MG tablet Take 1 tablet (5 mg total) by mouth daily. 90 tablet 3  . aspirin 81 MG tablet Take 81 mg by mouth every morning.     . Calcium Carbonate-Vitamin D (CALCIUM 600+D) 600-400 MG-UNIT per tablet Take 1 tablet by mouth daily.    . chlorpheniramine  (CHLOR-TRIMETON) 4 MG tablet Take 4 mg by mouth every 6 (six) hours as needed (drippy nose/throat drainage).     Marland Kitchen dextromethorphan-guaiFENesin (MUCINEX DM) 30-600 MG per 12 hr tablet Take 1-2 tablets by mouth every 12 (twelve) hours as needed.    . diclofenac sodium (VOLTAREN) 1 % GEL Apply 2 g topically 3 (three) times daily as needed. 100 g 1  . famotidine (PEPCID) 20 MG tablet Take 20 mg by mouth at bedtime.    Marland Kitchen glucose blood (PRODIGY TEST) test strip Ck blood sugars once daily or as instructed 50 each 12  . HYDROcodone-acetaminophen (NORCO) 5-325 MG per tablet Take 1 tablet by mouth every 6 (six) hours as needed. 30 tablet 0  . Insulin Detemir (LEVEMIR FLEXTOUCH) 100 UNIT/ML Pen Inject 70 Units into the skin every morning. And pen needles 1/day 30 mL 5  . Magnesium 250 MG TABS Take 1 tablet by mouth daily.    . magnesium hydroxide (MILK OF MAGNESIA) 400 MG/5ML suspension Per bottle as needed for constipation    . meloxicam (MOBIC) 15 MG tablet Take 1 tablet (15 mg total) by mouth daily. 7 tablet 0  . Multiple Vitamins-Minerals (CENTRUM SILVER PO) Take 1 tablet by mouth daily.      . pravastatin (PRAVACHOL) 20 MG tablet  Take 1 tablet (20 mg total) by mouth daily. 90 tablet 3  . sertraline (ZOLOFT) 100 MG tablet TAKE 1 TABLET BY MOUTH DAILY 90 tablet 3  . simethicone (GAS-X) 80 MG chewable tablet Take with meals as needed    . Sodium Chloride-Sodium Bicarb (AYR SALINE NASAL RINSE NA) Place into the nose. As needed    . valsartan-hydrochlorothiazide (DIOVAN HCT) 160-12.5 MG per tablet Take 1 tablet by mouth 2 (two) times daily. 180 tablet 3  . gabapentin (NEURONTIN) 100 MG capsule Take 1-2 capsules (100-200 mg total) by mouth at bedtime. (Patient not taking: Reported on 05/18/2015) 60 capsule 3   No current facility-administered medications on file prior to visit.    Allergies  Allergen Reactions  . Albuterol   . Doxycycline Nausea And Vomiting    Family History  Problem Relation Age of  Onset  . Hypertension Mother   . Diabetes Mother   . Diabetes Sister   . Heart disease Sister   . Heart disease Father   . Diabetes Brother   . Diabetes Daughter   . Diabetes Son   . Diabetes Daughter   . Diabetes Son     BP 134/64 mmHg  Pulse 93  Temp(Src) 98 F (36.7 C) (Oral)  Ht 5\' 1"  (1.549 m)  Wt 153 lb (69.4 kg)  BMI 28.92 kg/m2  SpO2 94%  Review of Systems She denies hypoglycemia.  She has weight gain.      Objective:   Physical Exam VITAL SIGNS:  See vs page GENERAL: no distress Skin: mild urticaria throughout the body  A1c=9.4%    Assessment & Plan:  DM: he needs increased rx Rash, new, uncertain etiology.  Very unlikely related to the insulin, but she declines to increase dosage    Patient is advised the following: Patient Instructions  check your blood sugar once a day.  vary the time of day when you check, between before the 3 meals, and at bedtime.  also check if you have symptoms of your blood sugar being too high or too low.  please keep a record of the readings and bring it to your next appointment here.  You can write it on any piece of paper.  please call us sooner if your blood sugar goes below 70, or if you have a lot of readings over 200.  i have sent 2 prescription to your pharmacy: to add januvia, and for the skin cream.   Please come back for a follow-up appointment in 2 months.

## 2015-05-18 NOTE — Patient Instructions (Addendum)
check your blood sugar once a day.  vary the time of day when you check, between before the 3 meals, and at bedtime.  also check if you have symptoms of your blood sugar being too high or too low.  please keep a record of the readings and bring it to your next appointment here.  You can write it on any piece of paper.  please call us sooner if your blood sugar goes below 70, or if you have a lot of readings over 200.  i have sent 2 prescription to your pharmacy: to add januvia, and for the skin cream.   Please come back for a follow-up appointment in 2 months.

## 2015-07-20 ENCOUNTER — Encounter: Payer: Self-pay | Admitting: Endocrinology

## 2015-07-20 ENCOUNTER — Ambulatory Visit (INDEPENDENT_AMBULATORY_CARE_PROVIDER_SITE_OTHER): Payer: 59 | Admitting: Endocrinology

## 2015-07-20 VITALS — BP 136/80 | HR 91 | Temp 98.1°F | Ht 61.0 in | Wt 154.0 lb

## 2015-07-20 DIAGNOSIS — E1165 Type 2 diabetes mellitus with hyperglycemia: Secondary | ICD-10-CM

## 2015-07-20 DIAGNOSIS — E1042 Type 1 diabetes mellitus with diabetic polyneuropathy: Secondary | ICD-10-CM

## 2015-07-20 DIAGNOSIS — IMO0001 Reserved for inherently not codable concepts without codable children: Secondary | ICD-10-CM

## 2015-07-20 LAB — POCT GLYCOSYLATED HEMOGLOBIN (HGB A1C): HEMOGLOBIN A1C: 8.6

## 2015-07-20 MED ORDER — INSULIN DETEMIR 100 UNIT/ML FLEXPEN: 90.0000 [IU] | PEN_INJECTOR | SUBCUTANEOUS | Status: DC

## 2015-07-20 NOTE — Patient Instructions (Addendum)
check your blood sugar once a day.  vary the time of day when you check, between before the 3 meals, and at bedtime.  also check if you have symptoms of your blood sugar being too high or too low.  please keep a record of the readings and bring it to your next appointment here.  You can write it on any piece of paper.  please call us sooner if your blood sugar goes below 70, or if you have a lot of readings over 200.  Please increase the insulin to 90 units each morning.  Please come back for a follow-up appointment in 3 months.

## 2015-07-20 NOTE — Progress Notes (Signed)
Subjective:    Patient ID: [name], female    DOB: 1929-01-09, 81 y.o.   MRN: [id number]  HPI Pt returns for f/u of diabetes mellitus: DM type: Insulin-requiring type 2 Dx'ed: Q000111Q Complications: PAD Therapy: insulin since mid-2015.  GDM: never.   DKA: never.   Severe hypoglycemia: never.   Pancreatitis: never.   Other: she agrees to take insulin only qd.   Interval history:  Pt says she never misses the insulin.  no cbg record, but states cbg's vary from 100-200.  It is in general higher as the day goes on.  She agrees to increase the insulin today.  Past Medical History  Diagnosis Date  . Hyperlipidemia   . Hypertension   . Varicose veins   . SOBOE (shortness of breath on exertion)   . Diabetes mellitus without complication [facility])     Past Surgical History  Procedure Laterality Date  . Appendectomy  50 years ago    Social History   Social History  . Marital Status: Single    Spouse Name: N/A  . Number of Children: N/A  . Years of Education: N/A   Occupational History  . Not on file.   Social History Main Topics  . Smoking status: Former Smoker -- 0.30 packs/day for 20 years    Types: Cigarettes    Quit date: 09/05/1985  . Smokeless tobacco: Never Used  . Alcohol Use: No  . Drug Use: No  . Sexual Activity: Not on file   Other Topics Concern  . Not on file   Social History Narrative    Current Outpatient Prescriptions on File Prior to Visit  Medication Sig Dispense Refill  . acetaminophen (TYLENOL) 500 MG tablet As directed with food when needed for arthritis pain/headache/back pain    . amLODipine (NORVASC) 5 MG tablet Take 1 tablet (5 mg total) by mouth daily. 90 tablet 3  . aspirin 81 MG tablet Take 81 mg by mouth every morning.     . Calcium Carbonate-Vitamin D (CALCIUM 600+D) 600-400 MG-UNIT per tablet Take 1 tablet by mouth daily.    . chlorpheniramine (CHLOR-TRIMETON) 4 MG tablet Take 4 mg by mouth every 6 (six) hours as needed (drippy  nose/throat drainage).     Marland Kitchen dextromethorphan-guaiFENesin (MUCINEX DM) 30-600 MG per 12 hr tablet Take 1-2 tablets by mouth every 12 (twelve) hours as needed.    . diclofenac sodium (VOLTAREN) 1 % GEL Apply 2 g topically 3 (three) times daily as needed. 100 g 1  . famotidine (PEPCID) 20 MG tablet Take 20 mg by mouth at bedtime.    . gabapentin (NEURONTIN) 100 MG capsule Take 1-2 capsules (100-200 mg total) by mouth at bedtime. (Patient not taking: Reported on 05/18/2015) 60 capsule 3  . glucose blood (PRODIGY TEST) test strip Ck blood sugars once daily or as instructed 50 each 12  . HYDROcodone-acetaminophen (NORCO) 5-325 MG per tablet Take 1 tablet by mouth every 6 (six) hours as needed. 30 tablet 0  . Magnesium 250 MG TABS Take 1 tablet by mouth daily.    . magnesium hydroxide (MILK OF MAGNESIA) 400 MG/5ML suspension Per bottle as needed for constipation    . meloxicam (MOBIC) 15 MG tablet Take 1 tablet (15 mg total) by mouth daily. 7 tablet 0  . Multiple Vitamins-Minerals (CENTRUM SILVER PO) Take 1 tablet by mouth daily.      . pravastatin (PRAVACHOL) 20 MG tablet Take 1 tablet (20 mg total) by mouth daily. [place]  tablet 3  . sertraline (ZOLOFT) 100 MG tablet TAKE 1 TABLET BY MOUTH DAILY 90 tablet 3  . simethicone (GAS-X) 80 MG chewable tablet Take with meals as needed    . sitaGLIPtin (JANUVIA) 100 MG tablet Take 1 tablet (100 mg total) by mouth daily. 30 tablet 11  . Sodium Chloride-Sodium Bicarb (AYR SALINE NASAL RINSE NA) Place into the nose. As needed    . triamcinolone cream (KENALOG) 0.1 % Apply 1 application topically 4 (four) times daily. As needed for rash 80 g 2  . valsartan-hydrochlorothiazide (DIOVAN HCT) 160-12.5 MG per tablet Take 1 tablet by mouth 2 (two) times daily. 180 tablet 3   No current facility-administered medications on file prior to visit.    Allergies  Allergen Reactions  . Albuterol   . Doxycycline Nausea And Vomiting    Family History  Problem Relation Age of  Onset  . Hypertension Mother   . Diabetes Mother   . Diabetes Sister   . Heart disease Sister   . Heart disease Father   . Diabetes Brother   . Diabetes Daughter   . Diabetes Son   . Diabetes Daughter   . Diabetes Son     BP 136/80 mmHg  Pulse 91  Temp(Src) 98.1 F (36.7 C) (Oral)  Ht 5\' 1"  (1.549 m)  Wt 154 lb (69.854 kg)  BMI 29.11 kg/m2  SpO2 95%  Review of Systems No weight change    Objective:   Physical Exam VITAL SIGNS:  See vs page.  GENERAL: no distress.  Pulses: dorsalis pedis intact bilat.   MSK: no deformity of the feet CV: no leg edema.  Skin:  no ulcer on the feet.  normal color and temp on the feet. Neuro: sensation is intact to touch on the feet.     Lab Results  Component Value Date   HGBA1C 8.6 07/20/2015      Assessment & Plan:  We discussed adding another oral med vs increasing levemir.  She agrees to increase levemir.   Patient is advised the following: Patient Instructions  check your blood sugar once a day.  vary the time of day when you check, between before the 3 meals, and at bedtime.  also check if you have symptoms of your blood sugar being too high or too low.  please keep a record of the readings and bring it to your next appointment here.  You can write it on any piece of paper.  please call us sooner if your blood sugar goes below 70, or if you have a lot of readings over 200.  Please increase the insulin to 90 units each morning.  Please come back for a follow-up appointment in 3 months.

## 2015-08-25 ENCOUNTER — Other Ambulatory Visit: Payer: Self-pay | Admitting: Internal Medicine

## 2015-10-08 ENCOUNTER — Telehealth: Payer: Self-pay | Admitting: Endocrinology

## 2015-10-08 NOTE — Telephone Encounter (Signed)
Cecille Rubin with [facility] called checking in on a request form that was faxed over. [phone]

## 2015-10-09 NOTE — Telephone Encounter (Signed)
Attempted to reach [facility]. The number listed below is incorrect. Number would not connect.

## 2015-10-20 ENCOUNTER — Ambulatory Visit: Payer: 59 | Admitting: Endocrinology

## 2015-10-30 ENCOUNTER — Ambulatory Visit: Payer: 59 | Admitting: Endocrinology

## 2015-11-02 ENCOUNTER — Telehealth: Payer: Self-pay | Admitting: Endocrinology

## 2015-11-02 NOTE — Telephone Encounter (Signed)
Please come back for a follow-up appointment in 2 weeks 

## 2015-11-02 NOTE — Telephone Encounter (Signed)
Patient no showed today's appt. Please advise on how to follow up. °A. No follow up necessary. °B. Follow up urgent. Contact patient immediately. °C. Follow up necessary. Contact patient and schedule visit in ___ days. °D. Follow up advised. Contact patient and schedule visit in ____weeks. ° °

## 2015-11-03 NOTE — Telephone Encounter (Signed)
Bad phone # on file, unable to contact

## 2015-11-03 NOTE — Telephone Encounter (Signed)
Caitlin, Could you please contact the pt and reschedule. Thanks! 

## 2016-01-07 ENCOUNTER — Other Ambulatory Visit: Payer: Self-pay | Admitting: Internal Medicine

## 2016-01-14 ENCOUNTER — Telehealth: Payer: Self-pay | Admitting: Endocrinology

## 2016-01-14 NOTE — Telephone Encounter (Signed)
please call patient: i got form from [facility]. i do not have access to this information. It is preferred that you provide them with receipts instead.

## 2016-02-02 ENCOUNTER — Ambulatory Visit (INDEPENDENT_AMBULATORY_CARE_PROVIDER_SITE_OTHER): Payer: 59 | Admitting: Endocrinology

## 2016-02-02 ENCOUNTER — Encounter: Payer: Self-pay | Admitting: Endocrinology

## 2016-02-02 VITALS — BP 144/80 | HR 82 | Temp 97.8°F | Ht 61.0 in | Wt 153.0 lb

## 2016-02-02 DIAGNOSIS — E1165 Type 2 diabetes mellitus with hyperglycemia: Secondary | ICD-10-CM

## 2016-02-02 DIAGNOSIS — IMO0001 Reserved for inherently not codable concepts without codable children: Secondary | ICD-10-CM

## 2016-02-02 LAB — POCT GLYCOSYLATED HEMOGLOBIN (HGB A1C): HEMOGLOBIN A1C: 8.5

## 2016-02-02 NOTE — Patient Instructions (Addendum)
check your blood sugar once a day.  vary the time of day when you check, between before the 3 meals, and at bedtime.  also check if you have symptoms of your blood sugar being too high or too low.  please keep a record of the readings and bring it to your next appointment here.  You can write it on any piece of paper.  please call us sooner if your blood sugar goes below 70, or if you have a lot of readings over 200.  Please continue the same insulin for now, and please stop taking the [place].   If we can increase the insulin without causing lows, we should.  However, the next step is to check the blood sugar at other time of day.  Please come back for a follow-up appointment in 3 months.

## 2016-02-02 NOTE — Progress Notes (Signed)
Subjective:    Patient ID: [name], female    DOB: 1929-07-13, 82 y.o.   MRN: [id number]  HPI Pt returns for f/u of diabetes mellitus: DM type: Insulin-requiring type 2 Dx'ed: Q000111Q Complications: PAD Therapy: insulin since mid-2015.   GDM: never.   DKA: never.   Severe hypoglycemia: never.   Pancreatitis: never.   Other: she declines multiple daily injections.   Interval history:  Pt says she misses the insulin approx twice per month. she brings a scant record of her cbg's which i have reviewed today.  She checks in am only.  It varies from 106-190.  pt states she feels well in general.   Past Medical History  Diagnosis Date  . Hyperlipidemia   . Hypertension   . Varicose veins   . SOBOE (shortness of breath on exertion)   . Diabetes mellitus without complication [facility])     Past Surgical History  Procedure Laterality Date  . Appendectomy  50 years ago    Social History   Social History  . Marital Status: Single    Spouse Name: N/A  . Number of Children: N/A  . Years of Education: N/A   Occupational History  . Not on file.   Social History Main Topics  . Smoking status: Former Smoker -- 0.30 packs/day for 20 years    Types: Cigarettes    Quit date: 09/05/1985  . Smokeless tobacco: Never Used  . Alcohol Use: No  . Drug Use: No  . Sexual Activity: Not on file   Other Topics Concern  . Not on file   Social History Narrative    Current Outpatient Prescriptions on File Prior to Visit  Medication Sig Dispense Refill  . acetaminophen (TYLENOL) 500 MG tablet As directed with food when needed for arthritis pain/headache/back pain    . amLODipine (NORVASC) 5 MG tablet Take 1 tablet (5 mg total) by mouth daily. 90 tablet 3  . aspirin 81 MG tablet Take 81 mg by mouth every morning.     . Calcium Carbonate-Vitamin D (CALCIUM 600+D) 600-400 MG-UNIT per tablet Take 1 tablet by mouth daily.    . chlorpheniramine (CHLOR-TRIMETON) 4 MG tablet Take 4 mg by mouth  every 6 (six) hours as needed (drippy nose/throat drainage).     Marland Kitchen dextromethorphan-guaiFENesin (MUCINEX DM) 30-600 MG per 12 hr tablet Take 1-2 tablets by mouth every 12 (twelve) hours as needed.    . diclofenac sodium (VOLTAREN) 1 % GEL Apply 2 g topically 3 (three) times daily as needed. 100 g 1  . famotidine (PEPCID) 20 MG tablet Take 20 mg by mouth at bedtime.    . gabapentin (NEURONTIN) 100 MG capsule TAKE 1-2 CAPSULES (100-200 MG TOTAL) BY MOUTH AT BEDTIME. 60 capsule 3  . glucose blood (PRODIGY TEST) test strip Ck blood sugars once daily or as instructed 50 each 12  . HYDROcodone-acetaminophen (NORCO) 5-325 MG per tablet Take 1 tablet by mouth every 6 (six) hours as needed. 30 tablet 0  . Insulin Detemir (LEVEMIR FLEXTOUCH) 100 UNIT/ML Pen Inject 90 Units into the skin every morning. And pen needles 1/day 30 mL 5  . Magnesium 250 MG TABS Take 1 tablet by mouth daily.    . magnesium hydroxide (MILK OF MAGNESIA) 400 MG/5ML suspension Per bottle as needed for constipation    . meloxicam (MOBIC) 15 MG tablet Take 1 tablet (15 mg total) by mouth daily. 7 tablet 0  . Multiple Vitamins-Minerals (CENTRUM SILVER PO) Take 1 tablet  by mouth daily.      . pravastatin (PRAVACHOL) 20 MG tablet Take 1 tablet (20 mg total) by mouth daily. 90 tablet 3  . sertraline (ZOLOFT) 100 MG tablet TAKE 1 TABLET BY MOUTH DAILY 90 tablet 3  . simethicone (GAS-X) 80 MG chewable tablet Take with meals as needed    . Sodium Chloride-Sodium Bicarb (AYR SALINE NASAL RINSE NA) Place into the nose. As needed    . triamcinolone cream (KENALOG) 0.1 % Apply 1 application topically 4 (four) times daily. As needed for rash 80 g 2  . valsartan-hydrochlorothiazide (DIOVAN HCT) 160-12.5 MG per tablet Take 1 tablet by mouth 2 (two) times daily. 180 tablet 3   No current facility-administered medications on file prior to visit.    Allergies  Allergen Reactions  . Albuterol   . Doxycycline Nausea And Vomiting    Family History   Problem Relation Age of Onset  . Hypertension Mother   . Diabetes Mother   . Diabetes Sister   . Heart disease Sister   . Heart disease Father   . Diabetes Brother   . Diabetes Daughter   . Diabetes Son   . Diabetes Daughter   . Diabetes Son     BP 144/80 mmHg  Pulse 82  Temp(Src) 97.8 F (36.6 C) (Oral)  Ht 5\' 1"  (1.549 m)  Wt 153 lb (69.4 kg)  BMI 28.92 kg/m2  SpO2 97%  Review of Systems She denies hypoglycemia    Objective:   Physical Exam VITAL SIGNS:  See vs page GENERAL: no distress Pulses: dorsalis pedis intact bilat.   MSK: no deformity of the feet CV: no leg edema Skin:  no ulcer on the feet.  normal color and temp on the feet. Neuro: sensation is intact to touch on the feet.   A1c=8.5%    Assessment & Plan:  Insulin-requiring type 2 DM: she needs increased rx, but we need more cbg info first.   Noncompliance with cbg recording and insulin, worse.    Patient is advised the following: Patient Instructions  check your blood sugar once a day.  vary the time of day when you check, between before the 3 meals, and at bedtime.  also check if you have symptoms of your blood sugar being too high or too low.  please keep a record of the readings and bring it to your next appointment here.  You can write it on any piece of paper.  please call us sooner if your blood sugar goes below 70, or if you have a lot of readings over 200.  Please continue the same insulin for now, and please stop taking the [place].   If we can increase the insulin without causing lows, we should.  However, the next step is to check the blood sugar at other time of day.  Please come back for a follow-up appointment in 3 months.     Renato Shin, MD

## 2016-03-16 DIAGNOSIS — E559 Vitamin D deficiency, unspecified: Secondary | ICD-10-CM | POA: Diagnosis not present

## 2016-03-16 DIAGNOSIS — M791 Myalgia: Secondary | ICD-10-CM | POA: Diagnosis not present

## 2016-03-16 DIAGNOSIS — M2550 Pain in unspecified joint: Secondary | ICD-10-CM | POA: Diagnosis not present

## 2016-03-27 ENCOUNTER — Emergency Department (HOSPITAL_COMMUNITY): Payer: PPO

## 2016-03-27 ENCOUNTER — Emergency Department (HOSPITAL_COMMUNITY)
Admission: EM | Admit: 2016-03-27 | Discharge: 2016-03-27 | Disposition: A | Discharge status: Home / Self Care | Payer: PPO | Attending: Emergency Medicine | Admitting: Emergency Medicine

## 2016-03-27 ENCOUNTER — Encounter (HOSPITAL_COMMUNITY): Payer: Self-pay | Admitting: Emergency Medicine

## 2016-03-27 DIAGNOSIS — W1830XA Fall on same level, unspecified, initial encounter: Secondary | ICD-10-CM | POA: Diagnosis not present

## 2016-03-27 DIAGNOSIS — M542 Cervicalgia: Secondary | ICD-10-CM | POA: Diagnosis not present

## 2016-03-27 DIAGNOSIS — Y939 Activity, unspecified: Secondary | ICD-10-CM | POA: Diagnosis not present

## 2016-03-27 DIAGNOSIS — Z87891 Personal history of nicotine dependence: Secondary | ICD-10-CM | POA: Diagnosis not present

## 2016-03-27 DIAGNOSIS — S0990XA Unspecified injury of head, initial encounter: Secondary | ICD-10-CM | POA: Diagnosis not present

## 2016-03-27 DIAGNOSIS — Z7982 Long term (current) use of aspirin: Secondary | ICD-10-CM | POA: Insufficient documentation

## 2016-03-27 DIAGNOSIS — Y999 Unspecified external cause status: Secondary | ICD-10-CM | POA: Diagnosis not present

## 2016-03-27 DIAGNOSIS — Y9289 Other specified places as the place of occurrence of the external cause: Secondary | ICD-10-CM | POA: Insufficient documentation

## 2016-03-27 DIAGNOSIS — I6529 Occlusion and stenosis of unspecified carotid artery: Secondary | ICD-10-CM

## 2016-03-27 DIAGNOSIS — I6523 Occlusion and stenosis of bilateral carotid arteries: Secondary | ICD-10-CM | POA: Diagnosis not present

## 2016-03-27 DIAGNOSIS — Z79899 Other long term (current) drug therapy: Secondary | ICD-10-CM | POA: Insufficient documentation

## 2016-03-27 DIAGNOSIS — E119 Type 2 diabetes mellitus without complications: Secondary | ICD-10-CM | POA: Insufficient documentation

## 2016-03-27 DIAGNOSIS — W19XXXA Unspecified fall, initial encounter: Secondary | ICD-10-CM

## 2016-03-27 DIAGNOSIS — S199XXA Unspecified injury of neck, initial encounter: Secondary | ICD-10-CM | POA: Diagnosis not present

## 2016-03-27 DIAGNOSIS — I10 Essential (primary) hypertension: Secondary | ICD-10-CM | POA: Diagnosis not present

## 2016-03-27 DIAGNOSIS — R51 Headache: Secondary | ICD-10-CM | POA: Diagnosis not present

## 2016-03-27 DIAGNOSIS — I2510 Atherosclerotic heart disease of native coronary artery without angina pectoris: Secondary | ICD-10-CM | POA: Insufficient documentation

## 2016-03-27 NOTE — ED Provider Notes (Signed)
[facility] DEPT Provider Note   CSN: [id number] Arrival date & time: 03/27/16  1813  First Provider Contact:  First MD Initiated Contact with Patient 03/27/16 2126        History   Chief Complaint Chief Complaint  Patient presents with  . Fall    HPI ROBECCA HAMACHER is a 82 y.o. female.  HPI   82 year old female with a history diabetes, hyperlipidemia, hyper tension presents with concern of mechanical fall from standing with head trauma. Patient reports that about 8 AM, her right ankle rolled, and she fell down to the ground, hitting the back of her head or neck on the dresser. She took Tylenol with some relief of her headache. Reports mild pain at this time. Denies any other injuries, and denies continuing ankle pain. Denies numbness, weakness, fever, chest pain, shortness of breath, or other injuries from the fall.  Past Medical History:  Diagnosis Date  . Diabetes mellitus without complication ([place])   . Hyperlipidemia   . Hypertension   . SOBOE (shortness of breath on exertion)   . Varicose veins     Patient Active Problem List   Diagnosis Date Noted  . PAD (peripheral artery disease) ([place]) 03/04/2014  . Type 1 diabetes mellitus with neurological manifestations ([place]) 02/13/2014  . Arthralgia 07/20/2011  . VITAMIN D DEFICIENCY 09/15/2008  . Chronic rhinitis 09/07/2007  . Diabetes mellitus type II, uncontrolled ([place]) 07/12/2007  . Hyperlipidemia 07/12/2007  . Morbid obesity ([place]) 07/12/2007  . Depression 07/12/2007  . Essential hypertension 07/12/2007  . Lumbar pain 07/12/2007    Past Surgical History:  Procedure Laterality Date  . APPENDECTOMY  50 years ago    OB History    No data available       Home Medications    Prior to Admission medications   Medication Sig Start Date End Date Taking? Authorizing Provider  acetaminophen (TYLENOL) 500 MG tablet As directed with food when needed for arthritis pain/headache/back pain 08/02/11   Tammy S  Parrett, NP  amLODipine (NORVASC) 5 MG tablet Take 1 tablet (5 mg total) by mouth daily. 09/17/14   Hoyt Koch, MD  aspirin 81 MG tablet Take 81 mg by mouth every morning.     Historical Provider, MD  Calcium Carbonate-Vitamin D (CALCIUM 600+D) 600-400 MG-UNIT per tablet Take 1 tablet by mouth daily.    Historical Provider, MD  chlorpheniramine (CHLOR-TRIMETON) 4 MG tablet Take 4 mg by mouth every 6 (six) hours as needed (drippy nose/throat drainage).     Historical Provider, MD  dextromethorphan-guaiFENesin (MUCINEX DM) 30-600 MG per 12 hr tablet Take 1-2 tablets by mouth every 12 (twelve) hours as needed. 08/02/11   Tammy S Parrett, NP  diclofenac sodium (VOLTAREN) 1 % GEL Apply 2 g topically 3 (three) times daily as needed. 10/16/14   Hoyt Koch, MD  famotidine (PEPCID) 20 MG tablet Take 20 mg by mouth at bedtime.    Historical Provider, MD  gabapentin (NEURONTIN) 100 MG capsule TAKE 1-2 CAPSULES (100-200 MG TOTAL) BY MOUTH AT BEDTIME. 01/07/16   Hoyt Koch, MD  glucose blood (PRODIGY TEST) test strip Ck blood sugars once daily or as instructed 04/23/12   Tanda Rockers, MD  HYDROcodone-acetaminophen (NORCO) 5-325 MG per tablet Take 1 tablet by mouth every 6 (six) hours as needed. 09/17/14   Hoyt Koch, MD  Insulin Detemir (LEVEMIR FLEXTOUCH) 100 UNIT/ML Pen Inject 90 Units into the skin every morning. And pen needles 1/day 07/20/15  Renato Shin, MD  Magnesium 250 MG TABS Take 1 tablet by mouth daily.    Historical Provider, MD  magnesium hydroxide (MILK OF MAGNESIA) 400 MG/5ML suspension Per bottle as needed for constipation 08/02/11   Tammy S Parrett, NP  meloxicam (MOBIC) 15 MG tablet Take 1 tablet (15 mg total) by mouth daily. 10/28/13   Tammy S Parrett, NP  Multiple Vitamins-Minerals (CENTRUM SILVER PO) Take 1 tablet by mouth daily.      Historical Provider, MD  pravastatin (PRAVACHOL) 20 MG tablet Take 1 tablet (20 mg total) by mouth daily. 09/17/14    Hoyt Koch, MD  sertraline (ZOLOFT) 100 MG tablet TAKE 1 TABLET BY MOUTH DAILY 09/17/14   Hoyt Koch, MD  simethicone (GAS-X) 80 MG chewable tablet Take with meals as needed 08/02/11   Tammy S Parrett, NP  Sodium Chloride-Sodium Bicarb (AYR SALINE NASAL RINSE NA) Place into the nose. As needed    Historical Provider, MD  triamcinolone cream (KENALOG) 0.1 % Apply 1 application topically 4 (four) times daily. As needed for rash 05/18/15   Renato Shin, MD  valsartan-hydrochlorothiazide (DIOVAN HCT) 160-12.5 MG per tablet Take 1 tablet by mouth 2 (two) times daily. 09/17/14   Hoyt Koch, MD    Family History Family History  Problem Relation Age of Onset  . Hypertension Mother   . Diabetes Mother   . Diabetes Sister   . Heart disease Sister   . Heart disease Father   . Diabetes Brother   . Diabetes Daughter   . Diabetes Son   . Diabetes Daughter   . Diabetes Son     Social History Social History  Substance Use Topics  . Smoking status: Former Smoker    Packs/day: 0.30    Years: 20.00    Types: Cigarettes    Quit date: 09/05/1985  . Smokeless tobacco: Never Used  . Alcohol use No     Allergies   Albuterol and Doxycycline   Review of Systems Review of Systems  Constitutional: Negative for fever.  HENT: Negative for sore throat.   Eyes: Negative for visual disturbance.  Respiratory: Negative for cough and shortness of breath.   Cardiovascular: Negative for chest pain.  Gastrointestinal: Negative for abdominal pain, nausea and vomiting.  Genitourinary: Negative for difficulty urinating.  Musculoskeletal: Positive for arthralgias (from arthritis) and neck pain. Negative for back pain.  Skin: Negative for rash.  Neurological: Positive for headaches. Negative for dizziness, seizures, syncope, facial asymmetry, speech difficulty, weakness and numbness.     Physical Exam Updated Vital Signs BP 185/74   Pulse 73   Temp 98.2 F (36.8 C) (Oral)    Ht 5\' 2"  (1.575 m)   Wt 152 lb (68.9 kg)   SpO2 99%   BMI 27.80 kg/m   Physical Exam  Constitutional: She is oriented to person, place, and time. She appears well-developed and well-nourished. No distress.  HENT:  Head: Normocephalic and atraumatic.  Eyes: Conjunctivae and EOM are normal.  Neck: Normal range of motion.  Cardiovascular: Normal rate, regular rhythm, normal heart sounds and intact distal pulses.  Exam reveals no gallop and no friction rub.   No murmur heard. Pulmonary/Chest: Effort normal and breath sounds normal. No respiratory distress. She has no wheezes. She has no rales. She exhibits no tenderness.  Abdominal: Soft. She exhibits no distension. There is no tenderness. There is no guarding.  Musculoskeletal: She exhibits no edema.       Cervical back: She exhibits tenderness.  Thoracic back: She exhibits no bony tenderness.       Lumbar back: She exhibits no bony tenderness.  Neurological: She is alert and oriented to person, place, and time. She has normal strength. No cranial nerve deficit or sensory deficit. She exhibits normal muscle tone. Coordination and gait normal. GCS eye subscore is 4. GCS verbal subscore is 5. GCS motor subscore is 6.  Skin: Skin is warm and dry. Capillary refill takes less than 2 seconds. No rash noted. She is not diaphoretic. No erythema.  Nursing note and vitals reviewed.    ED Treatments / Results  Labs (all labs ordered are listed, but only abnormal results are displayed) Labs Reviewed - No data to display  EKG  EKG Interpretation None       Radiology Ct Head Wo Contrast  Result Date: 03/27/2016 CLINICAL DATA:  Post fall now with head and neck pain EXAM: CT HEAD WITHOUT CONTRAST CT CERVICAL SPINE WITHOUT CONTRAST TECHNIQUE: Multidetector CT imaging of the head and cervical spine was performed following the standard protocol without intravenous contrast. Multiplanar CT image reconstructions of the cervical spine were also  generated. COMPARISON:  Head CT- 01/23/2005 FINDINGS: CT HEAD FINDINGS Scattered minimal periventricular hypodensities compatible with microvascular ischemic disease. Gray-white differentiation is otherwise well maintained without CT evidence of acute large territory infarct. No intraparenchymal or extra-axial mass or hemorrhage. Unchanged size and configuration of the ventricles and basilar cisterns. No midline shift. Intracranial atherosclerosis. Limited visualization the paranasal sinuses and mastoid air cells is normal. No air-fluid levels. Regional soft tissues appear normal. No displaced calvarial fracture. Post bilateral cataract surgery. CT CERVICAL SPINE FINDINGS C1 to the inferior endplate of T2 is imaged. Mildly accentuated cervical lordosis. No anterolisthesis or retrolisthesis. The bilateral facets are normally aligned. The dens is normally positioned between the lateral masses of C1. Mild degenerative change of the atlantodental articulation. There is a punctate (approximately 0.5 cm) ossicle about the caudal aspect of the anterior ring of C1 (sagittal image 33, series 9), likely the sequela of remote injury. Normal atlantodental articulations. No fracture or static subluxation of cervical spine. Cervical vertebral body heights are preserved. Prevertebral soft tissues are normal. Moderate to severe multilevel cervical spine DDD, worse at C4-C5 and to a lesser extent, C3-C4 and C5-C6 with disc space height loss, endplate irregularity and small posteriorly directed disc osteophyte complexes at these locations. Calcified atherosclerotic plaque within the bilateral carotid bulbs, left greater than right. No bulky cervical lymphadenopathy on this noncontrast examination. Limited visualization of the lung apices is normal. Normal noncontrast appearance of the thyroid gland. IMPRESSION: 1. Mild, likely age-appropriate atrophy, without acute intracranial process. 2. No fracture or static subluxation of  cervical spine 3. Moderate to severe multilevel cervical spine DDD, worse at C4-C5. 4. Calcified atherosclerotic plaque within the bilateral carotid bulbs. Further evaluation with nonemergent carotid Doppler ultrasound could be performed as indicated. Electronically Signed   By: Sandi Mariscal M.D.   On: 03/27/2016 19:39  Ct Cervical Spine Wo Contrast  Result Date: 03/27/2016 CLINICAL DATA:  Post fall now with head and neck pain EXAM: CT HEAD WITHOUT CONTRAST CT CERVICAL SPINE WITHOUT CONTRAST TECHNIQUE: Multidetector CT imaging of the head and cervical spine was performed following the standard protocol without intravenous contrast. Multiplanar CT image reconstructions of the cervical spine were also generated. COMPARISON:  Head CT- 01/23/2005 FINDINGS: CT HEAD FINDINGS Scattered minimal periventricular hypodensities compatible with microvascular ischemic disease. Gray-white differentiation is otherwise well maintained without CT evidence of acute large  territory infarct. No intraparenchymal or extra-axial mass or hemorrhage. Unchanged size and configuration of the ventricles and basilar cisterns. No midline shift. Intracranial atherosclerosis. Limited visualization the paranasal sinuses and mastoid air cells is normal. No air-fluid levels. Regional soft tissues appear normal. No displaced calvarial fracture. Post bilateral cataract surgery. CT CERVICAL SPINE FINDINGS C1 to the inferior endplate of T2 is imaged. Mildly accentuated cervical lordosis. No anterolisthesis or retrolisthesis. The bilateral facets are normally aligned. The dens is normally positioned between the lateral masses of C1. Mild degenerative change of the atlantodental articulation. There is a punctate (approximately 0.5 cm) ossicle about the caudal aspect of the anterior ring of C1 (sagittal image 33, series 9), likely the sequela of remote injury. Normal atlantodental articulations. No fracture or static subluxation of cervical spine.  Cervical vertebral body heights are preserved. Prevertebral soft tissues are normal. Moderate to severe multilevel cervical spine DDD, worse at C4-C5 and to a lesser extent, C3-C4 and C5-C6 with disc space height loss, endplate irregularity and small posteriorly directed disc osteophyte complexes at these locations. Calcified atherosclerotic plaque within the bilateral carotid bulbs, left greater than right. No bulky cervical lymphadenopathy on this noncontrast examination. Limited visualization of the lung apices is normal. Normal noncontrast appearance of the thyroid gland. IMPRESSION: 1. Mild, likely age-appropriate atrophy, without acute intracranial process. 2. No fracture or static subluxation of cervical spine 3. Moderate to severe multilevel cervical spine DDD, worse at C4-C5. 4. Calcified atherosclerotic plaque within the bilateral carotid bulbs. Further evaluation with nonemergent carotid Doppler ultrasound could be performed as indicated. Electronically Signed   By: Sandi Mariscal M.D.   On: 03/27/2016 19:39   Procedures Procedures (including critical care time)  Medications Ordered in ED Medications - No data to display   Initial Impression / Assessment and Plan / ED Course  I have reviewed the triage vital signs and the nursing notes.  Pertinent labs & imaging results that were available during my care of the patient were reviewed by me and considered in my medical decision making (see chart for details).  Clinical Course    82 year old female with a history diabetes, hyperlipidemia, hyper tension presents with concern of mechanical fall from standing with head trauma. CT head and cervical spine showed no acute abnormalities. Patient denies any other areas of pain or injury from the fall. Her neurologic exam is within normal limits. Recommend PCP follow-up. Discussed finding of carotid atherosclerosis on CT, recommend outpatient ultrasound.  Patient discharged in stable condition with  understanding of reasons to return.   Final Clinical Impressions(s) / ED Diagnoses   Final diagnoses:  Fall  Fall, initial encounter  Carotid artery plaque, unspecified laterality    New Prescriptions Discharge Medication List as of 03/27/2016  9:55 PM       Gareth Morgan, MD 03/28/16 0300

## 2016-03-27 NOTE — ED Triage Notes (Signed)
Pt states that around 0800 this am her ankle gave out and caused her to fall up against her dresser and hit her neck/head. Pain has gradually came on throughout the day starting about an hour after the injury. Pt took tylenol at 0900 approx. For pain and felt some relief.

## 2016-03-27 NOTE — Discharge Instructions (Signed)
Follow up with your primary care doctor about the findings of plaque in your carotid arteries as you would likely benefit from an ultrasound of these vessels. Take tylenol as needed for pain. Return, as needed, for worsening symptoms.

## 2016-03-27 NOTE — ED Notes (Signed)
Pt brought to E40, changed into gown, placed on monitor.

## 2016-03-30 ENCOUNTER — Other Ambulatory Visit: Payer: Self-pay | Admitting: Family Medicine

## 2016-03-30 DIAGNOSIS — E119 Type 2 diabetes mellitus without complications: Secondary | ICD-10-CM | POA: Diagnosis not present

## 2016-03-30 DIAGNOSIS — I6523 Occlusion and stenosis of bilateral carotid arteries: Secondary | ICD-10-CM | POA: Diagnosis not present

## 2016-03-30 DIAGNOSIS — E785 Hyperlipidemia, unspecified: Secondary | ICD-10-CM | POA: Diagnosis not present

## 2016-03-30 DIAGNOSIS — Z7984 Long term (current) use of oral hypoglycemic drugs: Secondary | ICD-10-CM | POA: Diagnosis not present

## 2016-03-30 DIAGNOSIS — E559 Vitamin D deficiency, unspecified: Secondary | ICD-10-CM | POA: Diagnosis not present

## 2016-03-30 DIAGNOSIS — Z794 Long term (current) use of insulin: Secondary | ICD-10-CM | POA: Diagnosis not present

## 2016-03-30 DIAGNOSIS — W19XXXA Unspecified fall, initial encounter: Secondary | ICD-10-CM | POA: Diagnosis not present

## 2016-04-01 DIAGNOSIS — M25651 Stiffness of right hip, not elsewhere classified: Secondary | ICD-10-CM | POA: Diagnosis not present

## 2016-04-01 DIAGNOSIS — M6281 Muscle weakness (generalized): Secondary | ICD-10-CM | POA: Diagnosis not present

## 2016-04-01 DIAGNOSIS — M545 Low back pain: Secondary | ICD-10-CM | POA: Diagnosis not present

## 2016-04-01 DIAGNOSIS — M25551 Pain in right hip: Secondary | ICD-10-CM | POA: Diagnosis not present

## 2016-04-08 ENCOUNTER — Other Ambulatory Visit: Payer: Self-pay | Admitting: Internal Medicine

## 2016-05-04 ENCOUNTER — Ambulatory Visit (INDEPENDENT_AMBULATORY_CARE_PROVIDER_SITE_OTHER): Payer: 59 | Admitting: Endocrinology

## 2016-05-04 ENCOUNTER — Telehealth: Payer: Self-pay | Admitting: Endocrinology

## 2016-05-04 ENCOUNTER — Encounter: Payer: Self-pay | Admitting: Endocrinology

## 2016-05-04 VITALS — BP 130/70 | HR 67 | Ht 61.0 in | Wt 152.0 lb

## 2016-05-04 DIAGNOSIS — E1165 Type 2 diabetes mellitus with hyperglycemia: Secondary | ICD-10-CM | POA: Diagnosis not present

## 2016-05-04 DIAGNOSIS — IMO0001 Reserved for inherently not codable concepts without codable children: Secondary | ICD-10-CM

## 2016-05-04 LAB — POCT GLYCOSYLATED HEMOGLOBIN (HGB A1C): HEMOGLOBIN A1C: 8.4

## 2016-05-04 MED ORDER — INSULIN ISOPHANE HUMAN 100 UNIT/ML KWIKPEN: 50.0000 [IU] | PEN_INJECTOR | SUBCUTANEOUS | 11 refills | Status: DC

## 2016-05-04 MED ORDER — PEN NEEDLES 31G X 5 MM MISC: 2.0000 | Freq: Every day | 2 refills | Status: AC

## 2016-05-04 NOTE — Progress Notes (Signed)
Subjective:    Patient ID: [name], female    DOB: 10-25-1928, 82 y.o.   MRN: [id number]  HPI Pt returns for f/u of diabetes mellitus: DM type: Insulin-requiring type 2 Dx'ed: [id number] Complications: PAD Therapy: insulin since mid-2015.   GDM: never.   DKA: never.   Severe hypoglycemia: never.   Pancreatitis: never.   Other: she declines multiple daily injections.   Interval history:  Pt says she never misses the insulin. she brings a scant record of her cbg's which i have reviewed today. It varies from 89-200's.  It is in general higher as the day goes on.  pt states she feels well in general.   Past Medical History:  Diagnosis Date  . Diabetes mellitus without complication ([place])   . Hyperlipidemia   . Hypertension   . SOBOE (shortness of breath on exertion)   . Varicose veins     Past Surgical History:  Procedure Laterality Date  . APPENDECTOMY  50 years ago    Social History   Social History  . Marital status: Single    Spouse name: N/A  . Number of children: N/A  . Years of education: N/A   Occupational History  . Not on file.   Social History Main Topics  . Smoking status: Former Smoker    Packs/day: 0.30    Years: 20.00    Types: Cigarettes    Quit date: 09/05/1985  . Smokeless tobacco: Never Used  . Alcohol use No  . Drug use: No  . Sexual activity: Not on file   Other Topics Concern  . Not on file   Social History Narrative  . No narrative on file    Current Outpatient Prescriptions on File Prior to Visit  Medication Sig Dispense Refill  . acetaminophen (TYLENOL) 500 MG tablet As directed with food when needed for arthritis pain/headache/back pain    . aspirin 81 MG tablet Take 81 mg by mouth every morning.     . Calcium Carbonate-Vitamin D (CALCIUM 600+D) 600-400 MG-UNIT per tablet Take 1 tablet by mouth daily.    . chlorpheniramine (CHLOR-TRIMETON) 4 MG tablet Take 4 mg by mouth every 6 (six) hours as needed (drippy nose/throat drainage).      Marland Kitchen dextromethorphan-guaiFENesin (MUCINEX DM) 30-600 MG per 12 hr tablet Take 1-2 tablets by mouth every 12 (twelve) hours as needed.    . famotidine (PEPCID) 20 MG tablet Take 20 mg by mouth at bedtime.    . gabapentin (NEURONTIN) 100 MG capsule TAKE 1-2 CAPSULES (100-200 MG TOTAL) BY MOUTH AT BEDTIME. 60 capsule 3  . glucose blood (PRODIGY TEST) test strip Ck blood sugars once daily or as instructed 50 each 12  . Magnesium 250 MG TABS Take 1 tablet by mouth daily.    . magnesium hydroxide (MILK OF MAGNESIA) 400 MG/5ML suspension Per bottle as needed for constipation    . Multiple Vitamins-Minerals (CENTRUM SILVER PO) Take 1 tablet by mouth daily.      . pravastatin (PRAVACHOL) 20 MG tablet Take 1 tablet (20 mg total) by mouth daily. 90 tablet 3  . sertraline (ZOLOFT) 100 MG tablet TAKE 1 TABLET BY MOUTH DAILY 90 tablet 3  . Sodium Chloride-Sodium Bicarb (AYR SALINE NASAL RINSE NA) Place into the nose. As needed    . triamcinolone cream (KENALOG) 0.1 % Apply 1 application topically 4 (four) times daily. As needed for rash 80 g 2  . valsartan-hydrochlorothiazide (DIOVAN HCT) 160-12.5 MG per tablet Take 1 tablet  by mouth 2 (two) times daily. 180 tablet 3  . amLODipine (NORVASC) 5 MG tablet Take 1 tablet (5 mg total) by mouth daily. (Patient not taking: Reported on 05/04/2016) 90 tablet 3  . diclofenac sodium (VOLTAREN) 1 % GEL Apply 2 g topically 3 (three) times daily as needed. (Patient not taking: Reported on 05/04/2016) 100 g 1  . HYDROcodone-acetaminophen (NORCO) 5-325 MG per tablet Take 1 tablet by mouth every 6 (six) hours as needed. (Patient not taking: Reported on 05/04/2016) 30 tablet 0  . meloxicam (MOBIC) 15 MG tablet Take 1 tablet (15 mg total) by mouth daily. (Patient not taking: Reported on 05/04/2016) 7 tablet 0  . simethicone (GAS-X) 80 MG chewable tablet Take with meals as needed (Patient not taking: Reported on 05/04/2016)     No current facility-administered medications on file  prior to visit.     Allergies  Allergen Reactions  . Albuterol   . Doxycycline Nausea And Vomiting    Family History  Problem Relation Age of Onset  . Hypertension Mother   . Diabetes Mother   . Diabetes Sister   . Heart disease Sister   . Heart disease Father   . Diabetes Brother   . Diabetes Daughter   . Diabetes Son   . Diabetes Daughter   . Diabetes Son     BP 130/70   Pulse 67   Ht 5\' 1"  (1.549 m)   Wt 152 lb (68.9 kg)   BMI 28.72 kg/m   Review of Systems She denies hypoglycemia.      Objective:   Physical Exam VITAL SIGNS:  See vs page GENERAL: no distress Pulses: dorsalis pedis intact bilat.   MSK: no deformity of the feet CV: no leg edema.  Skin:  no ulcer on the feet.  normal color and temp on the feet.   Neuro: sensation is intact to touch on the feet.   A1c=8.4%    Assessment & Plan:  Insulin-requiring type 2 DM: according to the pattern of her cbg's, she needs a faster-acting qd insulin.

## 2016-05-04 NOTE — Telephone Encounter (Signed)
Rx separated and sent.

## 2016-05-04 NOTE — Patient Instructions (Addendum)
check your blood sugar once a day.  vary the time of day when you check, between before the 3 meals, and at bedtime.  also check if you have symptoms of your blood sugar being too high or too low.  please keep a record of the readings and bring it to your next appointment here.  You can write it on any piece of paper.  please call us sooner if your blood sugar goes below 70, or if you have a lot of readings over 200.  I have sent a prescription to your pharmacy, to change levemir to "NPH."   This is not a unit-for-unit change, so we'll start at 50 units each morning.  Please come back for a follow-up appointment in 3 months.

## 2016-05-04 NOTE — Telephone Encounter (Signed)
[facility] just called and said that the script that was just electronically sent in needs to be two separate scripts for pen needles and insulin.

## 2016-05-19 ENCOUNTER — Telehealth: Payer: Self-pay | Admitting: Endocrinology

## 2016-05-19 NOTE — Telephone Encounter (Signed)
[facility] called about a PA for the Humulin, cb # [phone]

## 2016-05-20 ENCOUNTER — Telehealth: Payer: Self-pay | Admitting: Endocrinology

## 2016-05-20 MED ORDER — INSULIN NPH (HUMAN) (ISOPHANE) 100 UNIT/ML ~~LOC~~ SUSP: SUBCUTANEOUS | 2 refills | Status: DC

## 2016-05-20 NOTE — Telephone Encounter (Signed)
The reference number has been D/C ( humulin N) and changed to Novolin N do to insurance not covering the Humulin.

## 2016-05-20 NOTE — Telephone Encounter (Signed)
[facility] calling to let us know there is clarification needed [phone] ref [id number]

## 2016-05-20 NOTE — Telephone Encounter (Signed)
See message and please advise. Humulin N is not covered under the patient's insurance.

## 2016-05-20 NOTE — Telephone Encounter (Signed)
Novolin N sent.

## 2016-05-20 NOTE — Telephone Encounter (Signed)
novolin N is OK

## 2016-05-30 ENCOUNTER — Telehealth: Payer: Self-pay | Admitting: Endocrinology

## 2016-05-30 NOTE — Telephone Encounter (Signed)
Pt is letting us know that the pts humulin N is not being paid for by the insurance company  They need to know why the Humulin is being RX and if there was another med that has been tried and why it did not work.

## 2016-05-31 NOTE — Telephone Encounter (Signed)
I contacted the patient and left a voicemail advising we have changed the humulin to Novolin due to insurance purposes. Patient advised to call back if she had any questions.

## 2016-06-13 ENCOUNTER — Telehealth: Payer: Self-pay | Admitting: Endocrinology

## 2016-06-13 NOTE — Telephone Encounter (Signed)
[name] from [facility] asking if you received a fax for patient for diabetic testing supplies, please advise  [phone]

## 2016-06-16 NOTE — Telephone Encounter (Signed)
Requested a call back from [facility] at the number listed below to discuss what form is needed.

## 2016-06-29 DIAGNOSIS — I10 Essential (primary) hypertension: Secondary | ICD-10-CM | POA: Diagnosis not present

## 2016-06-29 DIAGNOSIS — E785 Hyperlipidemia, unspecified: Secondary | ICD-10-CM | POA: Diagnosis not present

## 2016-06-29 DIAGNOSIS — M2550 Pain in unspecified joint: Secondary | ICD-10-CM | POA: Diagnosis not present

## 2016-06-29 DIAGNOSIS — E119 Type 2 diabetes mellitus without complications: Secondary | ICD-10-CM | POA: Diagnosis not present

## 2016-06-29 DIAGNOSIS — Z794 Long term (current) use of insulin: Secondary | ICD-10-CM | POA: Diagnosis not present

## 2016-06-29 DIAGNOSIS — Z7984 Long term (current) use of oral hypoglycemic drugs: Secondary | ICD-10-CM | POA: Diagnosis not present

## 2016-07-08 DIAGNOSIS — M25511 Pain in right shoulder: Secondary | ICD-10-CM | POA: Diagnosis not present

## 2016-07-08 DIAGNOSIS — M19011 Primary osteoarthritis, right shoulder: Secondary | ICD-10-CM | POA: Diagnosis not present

## 2016-07-11 DIAGNOSIS — H26491 Other secondary cataract, right eye: Secondary | ICD-10-CM | POA: Diagnosis not present

## 2016-07-11 DIAGNOSIS — H52203 Unspecified astigmatism, bilateral: Secondary | ICD-10-CM | POA: Diagnosis not present

## 2016-07-11 DIAGNOSIS — E119 Type 2 diabetes mellitus without complications: Secondary | ICD-10-CM | POA: Diagnosis not present

## 2016-07-21 ENCOUNTER — Telehealth: Payer: Self-pay | Admitting: Endocrinology

## 2016-07-21 NOTE — Telephone Encounter (Signed)
Pt called in a requested that her insulin be sent in as a [facility] to [facility] [address].

## 2016-07-21 NOTE — Telephone Encounter (Signed)
I contacted the patient and advised via voicemail the novolin N does not come in the form of a quickpen. Patient was advised to call back and schedule her an appointment to discuss further with Dr. Loanne Drilling.

## 2016-07-22 ENCOUNTER — Telehealth: Payer: Self-pay | Admitting: Endocrinology

## 2016-07-22 MED ORDER — INSULIN ISOPHANE HUMAN 100 UNIT/ML KWIKPEN: 50.0000 [IU] | PEN_INJECTOR | SUBCUTANEOUS | 11 refills | Status: DC

## 2016-07-22 NOTE — Telephone Encounter (Signed)
No, lantus is too slow-acting for you.  I'll do PA for NPH pen, so I need PA form.

## 2016-07-22 NOTE — Telephone Encounter (Signed)
If you must take lantus, take just 40 units qam.

## 2016-07-22 NOTE — Telephone Encounter (Signed)
I contacted the patient's granddaughter and advised of message. She voiced understanding. I advised we would initiate the PA for the Humulin N on 07/25/2016. She verbalized understanding and had no further questions at this time.

## 2016-07-22 NOTE — Telephone Encounter (Signed)
Is there anything else she can take as a Educational psychologist.  Please advise  Granddaughter  Sherron Ales [phone]

## 2016-07-22 NOTE — Telephone Encounter (Signed)
Today please

## 2016-07-22 NOTE — Telephone Encounter (Signed)
Ok, I have sent a prescription to your pharmacy 

## 2016-07-22 NOTE — Telephone Encounter (Signed)
Patient's daughter called about the novolin N prescription. Daughter stated the novolin N in the vial is not practical at this time. She stated the patient struggles with using the vials and wanted to know if we could recommend a insulin pen for her to use? Please advise, Thanks!

## 2016-07-22 NOTE — Telephone Encounter (Signed)
Patient has is out of insulin for the weekend. Please advise, Thanks!

## 2016-07-22 NOTE — Telephone Encounter (Signed)
Patient called back and stated the Humlin N pen is not covered under the insurance. The patient does have lantus pens at home and wanted to know if she could take that for the time being?

## 2016-07-25 NOTE — Telephone Encounter (Addendum)
Pa for humulin N pen placed on your desk to review. Thanks!

## 2016-07-25 NOTE — Telephone Encounter (Signed)
Pt called in to follow up on medication and PA. Pt would like a call back to discuss what she should do further.

## 2016-07-26 DIAGNOSIS — Z7689 Persons encountering health services in other specified circumstances: Secondary | ICD-10-CM

## 2016-07-26 NOTE — Telephone Encounter (Signed)
I contacted the patient's daughter and advised I had spoken with Ronny Bacon (patient's grand daughter) on 07/22/2016 and she advised me the patient had some left over Lantus and wanted to know if she could use this until we got the Humulin N pen approved. Dr. Loanne Drilling gave the verbal ok on this on 07/22/2016. Patient's daughter voiced understanding and stated she would verify with the patient if she still had Lantus left. I advised we are waiting to hear back on the humulin N PA.

## 2016-07-26 NOTE — Telephone Encounter (Signed)
done

## 2016-07-26 NOTE — Telephone Encounter (Signed)
Pt daughter called Jeannene Patella) and said that her mother told her that she has not had insulin since last week, I informed her we have submitted the PA for the Humulin and are waiting to hear back from that, but I also informed her of the message from Friday about the Lantus, she wasn't sure about whether or not Pt has Lantus.  She would like a call back to discuss. [phone]

## 2016-07-27 ENCOUNTER — Telehealth: Payer: Self-pay

## 2016-07-27 ENCOUNTER — Telehealth: Payer: Self-pay | Admitting: Endocrinology

## 2016-07-27 NOTE — Telephone Encounter (Signed)
I spoke with Peter Congo the Patient's daughter and she stated we were misinformed on the patient's insulin. The Patient did not have any Lantus but the granddaughter did. Patient did not take any of the lantus and wanted to know if we had any samples of the Humulin N pen until we received the PA back on the Humulin N PA. I advised we did have a sample of the humulin N pen and she could come by and pick the medication up. Patient had no further questions at this time.

## 2016-08-04 ENCOUNTER — Ambulatory Visit: Payer: 59 | Admitting: Endocrinology

## 2016-08-04 DIAGNOSIS — Z0289 Encounter for other administrative examinations: Secondary | ICD-10-CM

## 2016-08-19 MED ORDER — INSULIN ISOPHANE HUMAN 100 UNIT/ML KWIKPEN: 50.0000 [IU] | PEN_INJECTOR | SUBCUTANEOUS | 11 refills | Status: DC

## 2016-08-19 NOTE — Telephone Encounter (Signed)
Refills submitted per patient's request.  

## 2016-08-19 NOTE — Telephone Encounter (Signed)
Patient need a refill of  Insulin NPH, Human,, Isophane, (HUMULIN N KWIKPEN) 100 UNIT/ML Kiwkpen  [id number] [address], [place] [phone] (Phone) [phone] (Fax)

## 2016-09-11 NOTE — Progress Notes (Deleted)
   Subjective:    Patient ID: [name], female    DOB: [birth year], 83 y.o.   MRN: [id number]  HPI Pt returns for f/u of diabetes mellitus: DM type: Insulin-requiring type 2 Dx'ed: Q000111Q Complications: PAD Therapy: insulin since mid-2015.   GDM: never.   DKA: never.   Severe hypoglycemia: never.   Pancreatitis: never.   Other: she declines multiple daily injections.   Interval history:  Pt says she never misses the insulin. she brings a scant record of her cbg's which i have reviewed today. It varies from 89-200's.  It is in general higher as the day goes on.  pt states she feels well in general.     Review of Systems     Objective:   Physical Exam VITAL SIGNS:  See vs page GENERAL: no distress Pulses: dorsalis pedis intact bilat.   MSK: no deformity of the feet CV: no leg edema.  Skin:  no ulcer on the feet.  normal color and temp on the feet.   Neuro: sensation is intact to touch on the feet.        Assessment & Plan:

## 2016-09-14 ENCOUNTER — Ambulatory Visit: Payer: 59 | Admitting: Endocrinology

## 2016-09-25 NOTE — Progress Notes (Signed)
Subjective:    Patient ID: [name], female    DOB: 12/06/1928, 83 y.o.   MRN: [id number]  HPI Pt returns for f/u of diabetes mellitus: DM type: Insulin-requiring type 2 Dx'ed: Q000111Q Complications: PAD Therapy: insulin since mid-2015.   GDM: never.   DKA: never.   Severe hypoglycemia: never.   Pancreatitis: never.   Other: she declines multiple daily injections; she was changed to QAM NPH, due to AM hypoglycemia and PM hyperglycemia.  Interval history:  Pt says she never misses the insulin.  she brings a record of her cbg's which i have reviewed today.  from 91 to the high-100's.  It is in general higher as the day goes on.   Past Medical History:  Diagnosis Date  . Diabetes mellitus without complication ([place])   . Hyperlipidemia   . Hypertension   . SOBOE (shortness of breath on exertion)   . Varicose veins     Past Surgical History:  Procedure Laterality Date  . APPENDECTOMY  50 years ago    Social History   Social History  . Marital status: Single    Spouse name: N/A  . Number of children: N/A  . Years of education: N/A   Occupational History  . Not on file.   Social History Main Topics  . Smoking status: Former Smoker    Packs/day: 0.30    Years: 20.00    Types: Cigarettes    Quit date: 09/05/1985  . Smokeless tobacco: Never Used  . Alcohol use No  . Drug use: No  . Sexual activity: Not on file   Other Topics Concern  . Not on file   Social History Narrative  . No narrative on file    Current Outpatient Prescriptions on File Prior to Visit  Medication Sig Dispense Refill  . acetaminophen (TYLENOL) 500 MG tablet As directed with food when needed for arthritis pain/headache/back pain    . aspirin 81 MG tablet Take 81 mg by mouth every morning.     . Calcium Carbonate-Vitamin D (CALCIUM 600+D) 600-400 MG-UNIT per tablet Take 1 tablet by mouth daily.    Marland Kitchen dextromethorphan-guaiFENesin (MUCINEX DM) 30-600 MG per 12 hr tablet Take 1-2 tablets by  mouth every 12 (twelve) hours as needed.    . diclofenac sodium (VOLTAREN) 1 % GEL Apply 2 g topically 3 (three) times daily as needed. 100 g 1  . glucose blood (PRODIGY TEST) test strip Ck blood sugars once daily or as instructed 50 each 12  . HYDROcodone-acetaminophen (NORCO) 5-325 MG per tablet Take 1 tablet by mouth every 6 (six) hours as needed. 30 tablet 0  . Insulin Pen Needle (PEN NEEDLES) 31G X 5 MM MISC 2 each by Does not apply route daily. 200 each 2  . Magnesium 250 MG TABS Take 1 tablet by mouth daily.    . magnesium hydroxide (MILK OF MAGNESIA) 400 MG/5ML suspension Per bottle as needed for constipation    . Multiple Vitamins-Minerals (CENTRUM SILVER PO) Take 1 tablet by mouth daily.      . pravastatin (PRAVACHOL) 20 MG tablet Take 1 tablet (20 mg total) by mouth daily. 90 tablet 3  . sertraline (ZOLOFT) 100 MG tablet TAKE 1 TABLET BY MOUTH DAILY 90 tablet 3  . Sodium Chloride-Sodium Bicarb (AYR SALINE NASAL RINSE NA) Place into the nose. As needed    . valsartan-hydrochlorothiazide (DIOVAN HCT) 160-12.5 MG per tablet Take 1 tablet by mouth 2 (two) times daily. 180 tablet 3  No current facility-administered medications on file prior to visit.     Allergies  Allergen Reactions  . Albuterol   . Doxycycline Nausea And Vomiting    Family History  Problem Relation Age of Onset  . Hypertension Mother   . Diabetes Mother   . Diabetes Sister   . Heart disease Sister   . Heart disease Father   . Diabetes Brother   . Diabetes Daughter   . Diabetes Son   . Diabetes Daughter   . Diabetes Son     BP 132/84   Pulse 81   Ht 5\' 1"  (1.549 m)   Wt 154 lb (69.9 kg)   SpO2 97%   BMI 29.10 kg/m    Review of Systems She denies hypoglycemia.      Objective:   Physical Exam VITAL SIGNS:  See vs page GENERAL: no distress Pulses: dorsalis pedis intact bilat.   MSK: no deformity of the feet CV: no leg edema Skin:  no ulcer on the feet.  normal color and temp on the  feet. Neuro: sensation is intact to touch on the feet  A1c=8.3%    Assessment & Plan:  Insulin-requiring type 2 DM, with PAD: she needs increased rx   Patient is advised the following: Patient Instructions  check your blood sugar once a day.  vary the time of day when you check, between before the 3 meals, and at bedtime.  also check if you have symptoms of your blood sugar being too high or too low.  please keep a record of the readings and bring it to your next appointment here.  You can write it on any piece of paper.  please call us sooner if your blood sugar goes below 70, or if you have a lot of readings over 200.  Please increase the  "NPH" insulin to 53 units each morning. On this type of insulin schedule, you should eat meals on a regular schedule.  If a meal is missed or significantly delayed, your blood sugar could go low.  Please come back for a follow-up appointment in 4 months.

## 2016-09-28 ENCOUNTER — Encounter: Payer: Self-pay | Admitting: Endocrinology

## 2016-09-28 ENCOUNTER — Ambulatory Visit (INDEPENDENT_AMBULATORY_CARE_PROVIDER_SITE_OTHER): Payer: 59 | Admitting: Endocrinology

## 2016-09-28 VITALS — BP 132/84 | HR 81 | Ht 61.0 in | Wt 154.0 lb

## 2016-09-28 DIAGNOSIS — E1165 Type 2 diabetes mellitus with hyperglycemia: Secondary | ICD-10-CM

## 2016-09-28 DIAGNOSIS — IMO0001 Reserved for inherently not codable concepts without codable children: Secondary | ICD-10-CM

## 2016-09-28 LAB — POCT GLYCOSYLATED HEMOGLOBIN (HGB A1C): HEMOGLOBIN A1C: 8.3

## 2016-09-28 MED ORDER — INSULIN ISOPHANE HUMAN 100 UNIT/ML KWIKPEN: 53.0000 [IU] | PEN_INJECTOR | SUBCUTANEOUS | 11 refills | Status: AC

## 2016-09-28 NOTE — Patient Instructions (Addendum)
check your blood sugar once a day.  vary the time of day when you check, between before the 3 meals, and at bedtime.  also check if you have symptoms of your blood sugar being too high or too low.  please keep a record of the readings and bring it to your next appointment here.  You can write it on any piece of paper.  please call us sooner if your blood sugar goes below 70, or if you have a lot of readings over 200.  Please increase the  "NPH" insulin to 53 units each morning. On this type of insulin schedule, you should eat meals on a regular schedule.  If a meal is missed or significantly delayed, your blood sugar could go low.  Please come back for a follow-up appointment in 4 months.

## 2016-10-03 DIAGNOSIS — D2311 Other benign neoplasm of skin of right eyelid, including canthus: Secondary | ICD-10-CM | POA: Diagnosis not present

## 2016-10-04 DIAGNOSIS — Z794 Long term (current) use of insulin: Secondary | ICD-10-CM | POA: Diagnosis not present

## 2016-10-04 DIAGNOSIS — I6523 Occlusion and stenosis of bilateral carotid arteries: Secondary | ICD-10-CM | POA: Diagnosis not present

## 2016-10-04 DIAGNOSIS — E1169 Type 2 diabetes mellitus with other specified complication: Secondary | ICD-10-CM | POA: Diagnosis not present

## 2016-10-04 DIAGNOSIS — E1142 Type 2 diabetes mellitus with diabetic polyneuropathy: Secondary | ICD-10-CM | POA: Diagnosis not present

## 2016-10-04 DIAGNOSIS — I10 Essential (primary) hypertension: Secondary | ICD-10-CM | POA: Diagnosis not present

## 2016-10-04 DIAGNOSIS — E782 Mixed hyperlipidemia: Secondary | ICD-10-CM | POA: Diagnosis not present

## 2016-10-06 DIAGNOSIS — I6523 Occlusion and stenosis of bilateral carotid arteries: Secondary | ICD-10-CM | POA: Diagnosis not present

## 2016-10-25 DIAGNOSIS — I6523 Occlusion and stenosis of bilateral carotid arteries: Secondary | ICD-10-CM | POA: Diagnosis not present

## 2016-10-25 DIAGNOSIS — I6501 Occlusion and stenosis of right vertebral artery: Secondary | ICD-10-CM | POA: Diagnosis not present

## 2016-11-14 DIAGNOSIS — R300 Dysuria: Secondary | ICD-10-CM | POA: Diagnosis not present

## 2017-01-25 ENCOUNTER — Ambulatory Visit: Payer: 59 | Admitting: Endocrinology

## 2017-01-25 DIAGNOSIS — Z0289 Encounter for other administrative examinations: Secondary | ICD-10-CM

## 2017-11-21 NOTE — H&P (Signed)
[name] is an 84 y.o. female.    Chief Complaint: right shoulder pain  HPI: Pt is a 84 y.o. female complaining of right shoulder pain for multiple years. Pain had continually increased since the beginning. X-rays in the clinic show end-stage arthritic changes of the right shoulder. Pt has tried various conservative treatments which have failed to alleviate their symptoms, including injections and therapy. Various options are discussed with the patient. Risks, benefits and expectations were discussed with the patient. Patient understand the risks, benefits and expectations and wishes to proceed with surgery.   PCP:  Kathyrn Lass, MD  D/C Plans: Home  PMH: Past Medical History:  Diagnosis Date  . Diabetes mellitus without complication ([place])   . Hyperlipidemia   . Hypertension   . SOBOE (shortness of breath on exertion)   . Varicose veins     PSH: Past Surgical History:  Procedure Laterality Date  . APPENDECTOMY  50 years ago    Social History:  reports that she quit smoking about 32 years ago. Her smoking use included cigarettes. She has a 6.00 pack-year smoking history. she has never used smokeless tobacco. She reports that she does not drink alcohol or use drugs.  Allergies:  Allergies  Allergen Reactions  . Albuterol   . Doxycycline Nausea And Vomiting    Medications: No current facility-administered medications for this encounter.    Current Outpatient Medications  Medication Sig Dispense Refill  . acetaminophen (TYLENOL) 500 MG tablet As directed with food when needed for arthritis pain/headache/back pain    . aspirin 81 MG tablet Take 81 mg by mouth every morning.     . Calcium Carbonate-Vitamin D (CALCIUM 600+D) 600-400 MG-UNIT per tablet Take 1 tablet by mouth daily.    Marland Kitchen dextromethorphan-guaiFENesin (MUCINEX DM) 30-600 MG per 12 hr tablet Take 1-2 tablets by mouth every 12 (twelve) hours as needed.    . diclofenac sodium (VOLTAREN) 1 % GEL Apply 2 g  topically 3 (three) times daily as needed. 100 g 1  . glucose blood (PRODIGY TEST) test strip Ck blood sugars once daily or as instructed 50 each 12  . HYDROcodone-acetaminophen (NORCO) 5-325 MG per tablet Take 1 tablet by mouth every 6 (six) hours as needed. 30 tablet 0  . Insulin NPH, Human,, Isophane, (HUMULIN N KWIKPEN) 100 UNIT/ML Kiwkpen Inject 53 Units into the skin every morning. And pen needles 1/day 20 mL 11  . Insulin Pen Needle (PEN NEEDLES) 31G X 5 MM MISC 2 each by Does not apply route daily. 200 each 2  . Magnesium 250 MG TABS Take 1 tablet by mouth daily.    . magnesium hydroxide (MILK OF MAGNESIA) 400 MG/5ML suspension Per bottle as needed for constipation    . Multiple Vitamins-Minerals (CENTRUM SILVER PO) Take 1 tablet by mouth daily.      . pravastatin (PRAVACHOL) 20 MG tablet Take 1 tablet (20 mg total) by mouth daily. 90 tablet 3  . sertraline (ZOLOFT) 100 MG tablet TAKE 1 TABLET BY MOUTH DAILY 90 tablet 3  . Sodium Chloride-Sodium Bicarb (AYR SALINE NASAL RINSE NA) Place into the nose. As needed    . valsartan-hydrochlorothiazide (DIOVAN HCT) 160-12.5 MG per tablet Take 1 tablet by mouth 2 (two) times daily. 180 tablet 3    No results found for this or any previous visit (from the past 48 hour(s)). No results found.  ROS: Pain with rom of the right upper extremity  Physical Exam:  Alert and oriented 84  y.o. female in no acute distress Cranial nerves 2-12 intact Cervical spine: full rom with no tenderness, nv intact distally Chest: active breath sounds bilaterally, no wheeze rhonchi or rales Heart: regular rate and rhythm, no murmur Abd: non tender non distended with active bowel sounds Hip is stable with rom  Right shoulder with limited rom and strength with ER and IR No rashes or edema  Assessment/Plan Assessment: right shoulder cuff arthropathy  Plan: Patient will undergo a right reverse total shoulder by Dr. Veverly Fells at [facility]. Risks benefits and  expectations were discussed with the patient. Patient understand risks, benefits and expectations and wishes to proceed.  Merla Riches PA-C, MPAS [facility] is now [employer] [address] [place], [place], [place] [address] Phone: [phone] [email or web address] Facebook  [employer]

## 2017-11-28 ENCOUNTER — Inpatient Hospital Stay (HOSPITAL_COMMUNITY): Admission: RE | Admit: 2017-11-28 | Payer: 59 | Source: Ambulatory Visit

## 2017-11-29 ENCOUNTER — Encounter (HOSPITAL_COMMUNITY): Payer: Self-pay

## 2017-11-29 ENCOUNTER — Inpatient Hospital Stay (HOSPITAL_COMMUNITY): Admit: 2017-11-29 | Payer: Medicare HMO | Admitting: Orthopedic Surgery

## 2017-11-29 SURGERY — ARTHROPLASTY, SHOULDER, TOTAL, REVERSE
Anesthesia: Choice | Laterality: Right

## 2018-02-27 ENCOUNTER — Encounter: Payer: Self-pay | Admitting: Internal Medicine

## 2018-05-01 NOTE — H&P (Signed)
Patient's anticipated LOS is less than 2 midnights, meeting these requirements: - Younger than 22 - Lives within 1 hour of care - Has a competent adult at home to recover with post-op recover - NO history of  - Chronic pain requiring opiods  - Diabetes  - Coronary Artery Disease  - Heart failure  - Heart attack  - Stroke  - DVT/VTE  - Cardiac arrhythmia  - Respiratory Failure/COPD  - Renal failure  - Anemia  - Advanced Liver disease       [name] is an 84 y.o. female.    Chief Complaint: right shoulder pain  HPI: Pt is a 84 y.o. female complaining of right shoulder pain for multiple years. Pain had continually increased since the beginning. X-rays in the clinic show end-stage arthritic changes of the right shoulder. Pt has tried various conservative treatments which have failed to alleviate their symptoms, including injections and therapy. Various options are discussed with the patient. Risks, benefits and expectations were discussed with the patient. Patient understand the risks, benefits and expectations and wishes to proceed with surgery.   PCP:  Kathyrn Lass, MD  D/C Plans: Home  PMH: Past Medical History:  Diagnosis Date  . Diabetes mellitus without complication ([place])   . Hyperlipidemia   . Hypertension   . SOBOE (shortness of breath on exertion)   . Varicose veins     PSH: Past Surgical History:  Procedure Laterality Date  . APPENDECTOMY  50 years ago    Social History:  reports that she quit smoking about 32 years ago. Her smoking use included cigarettes. She has a 6.00 pack-year smoking history. She has never used smokeless tobacco. She reports that she does not drink alcohol or use drugs.  Allergies:  Allergies  Allergen Reactions  . Albuterol   . Doxycycline Nausea And Vomiting    Medications: No current facility-administered medications for this encounter.    Current Outpatient Medications  Medication Sig Dispense Refill  .  acetaminophen (TYLENOL) 500 MG tablet As directed with food when needed for arthritis pain/headache/back pain    . aspirin 81 MG tablet Take 81 mg by mouth every morning.     . Calcium Carbonate-Vitamin D (CALCIUM 600+D) 600-400 MG-UNIT per tablet Take 1 tablet by mouth daily.    Marland Kitchen dextromethorphan-guaiFENesin (MUCINEX DM) 30-600 MG per 12 hr tablet Take 1-2 tablets by mouth every 12 (twelve) hours as needed.    . diclofenac sodium (VOLTAREN) 1 % GEL Apply 2 g topically 3 (three) times daily as needed. 100 g 1  . glucose blood (PRODIGY TEST) test strip Ck blood sugars once daily or as instructed 50 each 12  . HYDROcodone-acetaminophen (NORCO) 5-325 MG per tablet Take 1 tablet by mouth every 6 (six) hours as needed. 30 tablet 0  . Insulin NPH, Human,, Isophane, (HUMULIN N KWIKPEN) 100 UNIT/ML Kiwkpen Inject 53 Units into the skin every morning. And pen needles 1/day 20 mL 11  . Insulin Pen Needle (PEN NEEDLES) 31G X 5 MM MISC 2 each by Does not apply route daily. 200 each 2  . Magnesium 250 MG TABS Take 1 tablet by mouth daily.    . magnesium hydroxide (MILK OF MAGNESIA) 400 MG/5ML suspension Per bottle as needed for constipation    . Multiple Vitamins-Minerals (CENTRUM SILVER PO) Take 1 tablet by mouth daily.      . pravastatin (PRAVACHOL) 20 MG tablet Take 1 tablet (20 mg total) by mouth daily. 90 tablet 3  .  sertraline (ZOLOFT) 100 MG tablet TAKE 1 TABLET BY MOUTH DAILY 90 tablet 3  . Sodium Chloride-Sodium Bicarb (AYR SALINE NASAL RINSE NA) Place into the nose. As needed    . valsartan-hydrochlorothiazide (DIOVAN HCT) 160-12.5 MG per tablet Take 1 tablet by mouth 2 (two) times daily. 180 tablet 3    No results found for this or any previous visit (from the past 48 hour(s)). No results found.  ROS: Pain with rom of the right upper extremity  Physical Exam: Alert and oriented 84 y.o. female in no acute distress Cranial nerves 2-12 intact Cervical spine: full rom with no tenderness, nv  intact distally Chest: active breath sounds bilaterally, no wheeze rhonchi or rales Heart: regular rate and rhythm, no murmur Abd: non tender non distended with active bowel sounds Hip is stable with rom  Right shoulder pain and weakness with ER/IR and rom nv intact distally No rashes or edema distally  Assessment/Plan Assessment: right shoulder cuff arthropathy  Plan:  Patient will undergo a right reverse total shoulder by Dr. Veverly Fells at [facility]. Risks benefits and expectations were discussed with the patient. Patient understand risks, benefits and expectations and wishes to proceed. Preoperative templating of the joint replacement has been completed, documented, and submitted to the Operating Room personnel in order to optimize intra-operative equipment management.   Merla Riches PA-C, MPAS [facility] is now [employer] [address], [place], [place], [place] [address] Phone: [phone] [email or web address] Facebook  [employer]

## 2018-05-10 NOTE — Pre-Procedure Instructions (Signed)
SHIRL WEIR  05/10/2018      [id number] [address], [place] [facility]) AT [place] [facility]) [place] [address] Phone: [phone] Fax: [phone]    Your procedure is scheduled on May 18, 2018.  Report to [facility] Admitting at 730 AM.  Call this number if you have problems the morning of surgery:  [phone]   Remember:  Do not eat or drink after midnight.    Take these medicines the morning of surgery with A SIP OF WATER  Xalatan eye drops Tylenol-if needed Amlodipine (norvasc) Mucinex-if needed Sertraline (zoloft)  Follow your surgeon's instructions on when to hold/resume aspirin.  If no instructions were given call the office to determine how they would like to you take aspirin  7 days prior to surgery STOP taking any diclofenac (voltaren) gel,  Aleve, Naproxen, Ibuprofen, Motrin, Advil, Goody's, BC's, all herbal medications, fish oil, and all vitamins   WHAT DO I DO ABOUT MY DIABETES MEDICATION?   Marland Kitchen Do not take oral diabetes medicines (pills) the morning of surgery-pioglitazone (actos) or sitagliptin Celesta Gentile)  . THE NIGHT BEFORE SURGERY, take 17 units of levemir insulin-1/2 of your normal dose.       . THE MORNING OF SURGERY, take 17 units of levemir insulin-1/2 of your normal dose.  . The day of surgery, do not take other diabetes injectables, including Byetta (exenatide), Bydureon (exenatide ER), Victoza (liraglutide), or Trulicity (dulaglutide).  Reviewed and Endorsed by [facility] Patient Education Committee, August 2015  How to Manage Your Diabetes Before and After Surgery  Why is it important to control my blood sugar before and after surgery? . Improving blood sugar levels before and after surgery helps healing and can limit problems. . A way of improving blood sugar control is eating a healthy diet by: o  Eating less sugar and carbohydrates o  Increasing activity/exercise o  Talking  with your doctor about reaching your blood sugar goals . High blood sugars (greater than 180 mg/dL) can raise your risk of infections and slow your recovery, so you will need to focus on controlling your diabetes during the weeks before surgery. . Make sure that the doctor who takes care of your diabetes knows about your planned surgery including the date and location.  How do I manage my blood sugar before surgery? . Check your blood sugar at least 4 times a day, starting 2 days before surgery, to make sure that the level is not too high or low. o Check your blood sugar the morning of your surgery when you wake up and every 2 hours until you get to the Short Stay unit. . If your blood sugar is less than 70 mg/dL, you will need to treat for low blood sugar: o Do not take insulin. o Treat a low blood sugar (less than 70 mg/dL) with  cup of clear juice (cranberry or apple), 4 glucose tablets, OR glucose gel. Recheck blood sugar in 15 minutes after treatment (to make sure it is greater than 70 mg/dL). If your blood sugar is not greater than 70 mg/dL on recheck, call [phone] o  for further instructions. . Report your blood sugar to the short stay nurse when you get to Short Stay.  . If you are admitted to the hospital after surgery: o Your blood sugar will be checked by the staff and you will probably be given insulin after surgery (instead of oral  diabetes medicines) to make sure you have good blood sugar levels. o The goal for blood sugar control after surgery is 80-180 mg/dL.  Contacts, dentures or bridgework may not be worn into surgery.  Leave your suitcase in the car.  After surgery it may be brought to your room.  For patients admitted to the hospital, discharge time will be determined by your treatment team.  Patients discharged the day of surgery will not be allowed to drive home.    [facility]- Preparing For Surgery  Before surgery, you can play an important role. Because skin  is not sterile, your skin needs to be as free of germs as possible. You can reduce the number of germs on your skin by washing with CHG (chlorahexidine gluconate) Soap before surgery.  CHG is an antiseptic cleaner which kills germs and bonds with the skin to continue killing germs even after washing.    Oral Hygiene is also important to reduce your risk of infection.  Remember - BRUSH YOUR TEETH THE MORNING OF SURGERY WITH YOUR REGULAR TOOTHPASTE  Please do not use if you have an allergy to CHG or antibacterial soaps. If your skin becomes reddened/irritated stop using the CHG.  Do not shave (including legs and underarms) for at least 48 hours prior to first CHG shower. It is OK to shave your face.  Please follow these instructions carefully.   1. Shower the NIGHT BEFORE SURGERY and the MORNING OF SURGERY with CHG.   2. If you chose to wash your hair, wash your hair first as usual with your normal shampoo.  3. After you shampoo, rinse your hair and body thoroughly to remove the shampoo.  4. Use CHG as you would any other liquid soap. You can apply CHG directly to the skin and wash gently with a scrungie or a clean washcloth.   5. Apply the CHG Soap to your body ONLY FROM THE NECK DOWN.  Do not use on open wounds or open sores. Avoid contact with your eyes, ears, mouth and genitals (private parts). Wash Face and genitals (private parts)  with your normal soap.  6. Wash thoroughly, paying special attention to the area where your surgery will be performed.  7. Thoroughly rinse your body with warm water from the neck down.  8. DO NOT shower/wash with your normal soap after using and rinsing off the CHG Soap.  9. Pat yourself dry with a CLEAN TOWEL.  10. Wear CLEAN PAJAMAS to bed the night before surgery, wear comfortable clothes the morning of surgery  11. Place CLEAN SHEETS on your bed the night of your first shower and DO NOT SLEEP WITH PETS.  Day of Surgery:  Do not apply any  deodorants/lotions.  Please wear clean clothes to the hospital/surgery center.   Remember to brush your teeth WITH YOUR REGULAR TOOTHPASTE.   Do not wear jewelry, make-up or nail polish.  Do not wear lotions, powders, or perfumes, or deodorant.  Do not shave 48 hours prior to surgery.    Do not bring valuables to the hospital.   [facility] is not responsible for any belongings or valuables.  Please read over the following fact sheets that you were given.

## 2018-05-11 ENCOUNTER — Encounter (HOSPITAL_COMMUNITY)
Admission: RE | Admit: 2018-05-11 | Discharge: 2018-05-11 | Disposition: A | Discharge status: Home / Self Care | Payer: Medicare HMO | Source: Ambulatory Visit | Attending: Orthopedic Surgery | Admitting: Orthopedic Surgery

## 2018-05-11 ENCOUNTER — Encounter (HOSPITAL_COMMUNITY): Payer: Self-pay

## 2018-05-11 ENCOUNTER — Other Ambulatory Visit: Payer: Self-pay

## 2018-05-11 DIAGNOSIS — Z01812 Encounter for preprocedural laboratory examination: Secondary | ICD-10-CM | POA: Insufficient documentation

## 2018-05-11 HISTORY — DX: Gastro-esophageal reflux disease without esophagitis: K21.9

## 2018-05-11 HISTORY — DX: Depression, unspecified: F32.A

## 2018-05-11 HISTORY — DX: Major depressive disorder, single episode, unspecified: F32.9

## 2018-05-11 HISTORY — DX: Unspecified osteoarthritis, unspecified site: M19.90

## 2018-05-11 HISTORY — DX: Anxiety disorder, unspecified: F41.9

## 2018-05-11 LAB — BASIC METABOLIC PANEL
ANION GAP: 7 (ref 5–15)
BUN: 21 mg/dL (ref 8–23)
CO2: 28 mmol/L (ref 22–32)
Calcium: 10.5 mg/dL — ABNORMAL HIGH (ref 8.9–10.3)
Chloride: 106 mmol/L (ref 98–111)
Creatinine, Ser: 1.24 mg/dL — ABNORMAL HIGH (ref 0.44–1.00)
GFR calc Af Amer: 44 mL/min — ABNORMAL LOW (ref >60)
GFR, EST NON AFRICAN AMERICAN: 38 mL/min — AB (ref >60)
GLUCOSE: 105 mg/dL — AB (ref 70–99)
POTASSIUM: 4.1 mmol/L (ref 3.5–5.1)
Sodium: 141 mmol/L (ref 135–145)

## 2018-05-11 LAB — CBC
HEMATOCRIT: 41.5 % (ref 36.0–46.0)
HEMOGLOBIN: 12.7 g/dL (ref 12.0–15.0)
MCH: 28 pg (ref 26.0–34.0)
MCHC: 30.6 g/dL (ref 30.0–36.0)
MCV: 91.6 fL (ref 78.0–100.0)
Platelets: 161 10*3/uL (ref 150–400)
RBC: 4.53 MIL/uL (ref 3.87–5.11)
RDW: 16.8 % — ABNORMAL HIGH (ref 11.5–15.5)
WBC: 5.3 10*3/uL (ref 4.0–10.5)

## 2018-05-11 LAB — GLUCOSE, CAPILLARY: GLUCOSE-CAPILLARY: 65 mg/dL — AB (ref 70–99)

## 2018-05-11 LAB — SURGICAL PCR SCREEN
MRSA, PCR: NEGATIVE
Staphylococcus aureus: NEGATIVE

## 2018-05-11 LAB — HEMOGLOBIN A1C
Hgb A1c MFr Bld: 6.7 % — ABNORMAL HIGH (ref 4.8–5.6)
MEAN PLASMA GLUCOSE: 145.59 mg/dL

## 2018-05-11 NOTE — Progress Notes (Addendum)
Pt/ lives in [place].  Requested Nelly Rout from  [facility]   Phone [phone] Fax [phone]      Requested OV any testing done from [facility]  Phone [phone]  Denies any cardiac history or cardiac testing  PCP Dr. Renee Harder       Fax lines busy on Friday will try to fax again on Monday

## 2018-05-14 NOTE — Progress Notes (Signed)
Anesthesia Chart Review:  Case:  [id number] Date/Time:  05/18/18 0915   Procedure:  REVERSE RIGHT SHOULDER ARTHROPLASTY (Right Shoulder)   Anesthesia type:  Choice   Pre-op diagnosis:  right shoulder osteoarthritis, rotator cuff insufficiency   Location:  [facility] OR ROOM 05 / [place] [facility]   Surgeon:  Netta Cedars, MD      DISCUSSION: 84 yo female former smoker for above procedure. Pertinent hx includes HTN, DOE, HTN, GERD, Anxiety, Depression, IDDM, CKD, Carotid stenosis (bilateral 50-69% per [place] 2019).  Pt was seen by PCP Malachi Carl NP 02/20/2018 for preop clearance. Per Margarita Grizzle at Dr. Gilberto Better office they received clearance form stating pt was not cleared for surgery due to poor BG control. However, the pt's A1c was 6.5 on 02/20/2018 and Dr. Veverly Fells felt that this was an acceptable level for surgery and decided to proceed as planned.  Pt is also followed for CKD III and was recently referred to [facility] in [place]. Review of recent labwork shows baseline creatinine ~1.3-1.4.  Anticipate she can proceed with surgery as planned barring acute status change.  VS: BP (!) 200/61   Pulse 70   Temp 36.5 C (Oral)   Ht 5\' 1"  (1.549 m)   Wt 70.9 kg   SpO2 100%   BMI 29.53 kg/m   PROVIDERS: Kathyrn Lass, MD is PCP, pt was last seen in office by Malachi Carl NP 02/20/2018   LABS: Labs reviewed: Acceptable for surgery. Mildly elevated creatinine consistent with pt's CKD. (all labs ordered are listed, but only abnormal results are displayed)  Labs Reviewed  GLUCOSE, CAPILLARY - Abnormal; Notable for the following components:      Result Value   Glucose-Capillary 65 (*)    All other components within normal limits  HEMOGLOBIN A1C - Abnormal; Notable for the following components:   Hgb A1c MFr Bld 6.7 (*)    All other components within normal limits  BASIC METABOLIC PANEL - Abnormal; Notable for the following components:   Glucose, Bld 105 (*)    Creatinine, Ser 1.24 (*)    Calcium 10.5 (*)     GFR calc non Af Amer 38 (*)    GFR calc Af Amer 44 (*)    All other components within normal limits  CBC - Abnormal; Notable for the following components:   RDW 16.8 (*)    All other components within normal limits  SURGICAL PCR SCREEN     IMAGES: CHEST  2 VIEW 06/19/2013  COMPARISON:  Chest x-ray of 03/19/2013  FINDINGS: No active infiltrate or effusion is seen. Mediastinal contours are stable. The heart is borderline enlarged and stable. There are moderate degenerative changes throughout the thoracic spine which are unchanged.  IMPRESSION: Stable chest x-ray. Borderline cardiomegaly. No active lung disease.    EKG: Requested, if not received will need DOS.   CV: Carotid US 12/20/17 (care everywhere):  FINDINGS:  There is significant calcified atherosclerotic plaque within the carotid bulbs bilaterally extending into the internal carotid arteries.  Intimal thickness:   Right: 1.2 mm  Left: 0.9 mm  Elevated peak systolic velocity in the bilateral internal carotid arteries, right greater than left resulting in 50-69 percent diameter stenosis.  There is normal antegrade flow in both vertebral arteries.  IMPRESSION: Scattered atherosclerotic plaque in the carotid bulbs bilaterally extending into the internal carotid arteries. Elevated peak systolic velocities in the internal carotid arteries bilaterally resulting in 50-69 percent diameter stenosis. This appears mildly progressed from prior exam.  Past Medical History:  Diagnosis Date  . Anxiety   . Arthritis   . Depression   . Diabetes mellitus without complication ([place])   . GERD (gastroesophageal reflux disease)   . Hyperlipidemia   . Hypertension   . SOBOE (shortness of breath on exertion)   . Varicose veins     Past Surgical History:  Procedure Laterality Date  . APPENDECTOMY  50 years ago  . EYE SURGERY Bilateral    cataract removal    MEDICATIONS: . acetaminophen (TYLENOL) 500 MG tablet   . acetaminophen (TYLENOL) 650 MG CR tablet  . amLODipine (NORVASC) 5 MG tablet  . aspirin 81 MG tablet  . atorvastatin (LIPITOR) 40 MG tablet  . cholecalciferol (VITAMIN D) 1000 units tablet  . Cyanocobalamin (B-12) 2500 MCG TABS  . diclofenac sodium (VOLTAREN) 1 % GEL  . glucose blood (PRODIGY TEST) test strip  . guaiFENesin (MUCINEX) 600 MG 12 hr tablet  . guaiFENesin-dextromethorphan (ROBITUSSIN DM) 100-10 MG/5ML syrup  . insulin detemir (LEVEMIR) 100 UNIT/ML injection  . Insulin NPH, Human,, Isophane, (HUMULIN N KWIKPEN) 100 UNIT/ML Kiwkpen  . Insulin Pen Needle (PEN NEEDLES) 31G X 5 MM MISC  . latanoprost (XALATAN) 0.005 % ophthalmic solution  . Magnesium 250 MG TABS  . magnesium hydroxide (MILK OF MAGNESIA) 400 MG/5ML suspension  . olmesartan (BENICAR) 40 MG tablet  . OVER THE COUNTER MEDICATION  . pioglitazone (ACTOS) 30 MG tablet  . pravastatin (PRAVACHOL) 20 MG tablet  . QUEtiapine (SEROQUEL) 25 MG tablet  . sertraline (ZOLOFT) 100 MG tablet  . sitaGLIPtin (JANUVIA) 100 MG tablet  . valsartan-hydrochlorothiazide (DIOVAN HCT) 160-12.5 MG per tablet   No current facility-administered medications for this encounter.     Wynonia Musty [facility]/Anesthesiology Phone [phone] 05/14/2018 2:55 PM

## 2018-05-18 ENCOUNTER — Inpatient Hospital Stay (HOSPITAL_COMMUNITY)
Admission: RE | Admit: 2018-05-18 | Discharge: 2018-05-19 | DRG: 483 | Disposition: A | Discharge status: Home / Self Care | Payer: Medicare HMO | Source: Ambulatory Visit | Attending: Orthopedic Surgery | Admitting: Orthopedic Surgery

## 2018-05-18 ENCOUNTER — Encounter (HOSPITAL_COMMUNITY)
Admission: RE | Disposition: A | Discharge status: Home / Self Care | Payer: Self-pay | Source: Ambulatory Visit | Attending: Orthopedic Surgery

## 2018-05-18 ENCOUNTER — Inpatient Hospital Stay (HOSPITAL_COMMUNITY): Payer: Medicare HMO | Admitting: Anesthesiology

## 2018-05-18 ENCOUNTER — Inpatient Hospital Stay (HOSPITAL_COMMUNITY): Payer: Medicare HMO

## 2018-05-18 ENCOUNTER — Encounter (HOSPITAL_COMMUNITY): Payer: Self-pay

## 2018-05-18 ENCOUNTER — Inpatient Hospital Stay (HOSPITAL_COMMUNITY): Payer: Medicare HMO | Admitting: Physician Assistant

## 2018-05-18 DIAGNOSIS — I10 Essential (primary) hypertension: Secondary | ICD-10-CM | POA: Diagnosis not present

## 2018-05-18 DIAGNOSIS — E785 Hyperlipidemia, unspecified: Secondary | ICD-10-CM | POA: Diagnosis not present

## 2018-05-18 DIAGNOSIS — I491 Atrial premature depolarization: Secondary | ICD-10-CM | POA: Diagnosis present

## 2018-05-18 DIAGNOSIS — Z87891 Personal history of nicotine dependence: Secondary | ICD-10-CM | POA: Diagnosis not present

## 2018-05-18 DIAGNOSIS — Z96611 Presence of right artificial shoulder joint: Secondary | ICD-10-CM

## 2018-05-18 DIAGNOSIS — Z794 Long term (current) use of insulin: Secondary | ICD-10-CM | POA: Diagnosis not present

## 2018-05-18 DIAGNOSIS — F329 Major depressive disorder, single episode, unspecified: Secondary | ICD-10-CM | POA: Diagnosis present

## 2018-05-18 DIAGNOSIS — M19011 Primary osteoarthritis, right shoulder: Secondary | ICD-10-CM | POA: Diagnosis not present

## 2018-05-18 DIAGNOSIS — F419 Anxiety disorder, unspecified: Secondary | ICD-10-CM | POA: Diagnosis present

## 2018-05-18 DIAGNOSIS — M75101 Unspecified rotator cuff tear or rupture of right shoulder, not specified as traumatic: Secondary | ICD-10-CM | POA: Diagnosis not present

## 2018-05-18 DIAGNOSIS — Z7982 Long term (current) use of aspirin: Secondary | ICD-10-CM | POA: Diagnosis not present

## 2018-05-18 DIAGNOSIS — Z79899 Other long term (current) drug therapy: Secondary | ICD-10-CM

## 2018-05-18 DIAGNOSIS — M1990 Unspecified osteoarthritis, unspecified site: Secondary | ICD-10-CM | POA: Diagnosis not present

## 2018-05-18 DIAGNOSIS — Z881 Allergy status to other antibiotic agents status: Secondary | ICD-10-CM | POA: Diagnosis not present

## 2018-05-18 DIAGNOSIS — E119 Type 2 diabetes mellitus without complications: Secondary | ICD-10-CM | POA: Diagnosis present

## 2018-05-18 DIAGNOSIS — Z888 Allergy status to other drugs, medicaments and biological substances status: Secondary | ICD-10-CM | POA: Diagnosis not present

## 2018-05-18 HISTORY — PX: REVERSE SHOULDER ARTHROPLASTY: SHX5054

## 2018-05-18 LAB — GLUCOSE, CAPILLARY
GLUCOSE-CAPILLARY: 131 mg/dL — AB (ref 70–99)
GLUCOSE-CAPILLARY: 173 mg/dL — AB (ref 70–99)
Glucose-Capillary: 109 mg/dL — ABNORMAL HIGH (ref 70–99)
Glucose-Capillary: 149 mg/dL — ABNORMAL HIGH (ref 70–99)

## 2018-05-18 SURGERY — ARTHROPLASTY, SHOULDER, TOTAL, REVERSE
Anesthesia: Regional | Site: Shoulder | Laterality: Right

## 2018-05-18 MED ORDER — HYDRALAZINE HCL 20 MG/ML IJ SOLN
5.0000 mg | Freq: Once | INTRAMUSCULAR | Status: AC
  Administered 2018-05-18: 5 mg via INTRAVENOUS

## 2018-05-18 MED ORDER — MORPHINE SULFATE (PF) 2 MG/ML IV SOLN: 0.5000 mg | INTRAVENOUS | Status: DC | PRN

## 2018-05-18 MED ORDER — PHENYLEPHRINE 40 MCG/ML (10ML) SYRINGE FOR IV PUSH (FOR BLOOD PRESSURE SUPPORT)
PREFILLED_SYRINGE | INTRAVENOUS | Status: AC
  Filled 2018-05-18: qty 10

## 2018-05-18 MED ORDER — BISACODYL 10 MG RE SUPP: 10.0000 mg | Freq: Every day | RECTAL | Status: DC | PRN

## 2018-05-18 MED ORDER — CEFAZOLIN SODIUM-DEXTROSE 2-4 GM/100ML-% IV SOLN
2.0000 g | INTRAVENOUS | Status: AC
  Administered 2018-05-18: 2 g via INTRAVENOUS
  Filled 2018-05-18: qty 100

## 2018-05-18 MED ORDER — LIDOCAINE 2% (20 MG/ML) 5 ML SYRINGE
INTRAMUSCULAR | Status: AC
  Filled 2018-05-18: qty 5

## 2018-05-18 MED ORDER — INSULIN ASPART 100 UNIT/ML ~~LOC~~ SOLN
4.0000 [IU] | Freq: Three times a day (TID) | SUBCUTANEOUS | Status: DC
  Administered 2018-05-18 – 2018-05-19 (×2): 4 [IU] via SUBCUTANEOUS

## 2018-05-18 MED ORDER — HYDROCODONE-ACETAMINOPHEN 5-325 MG PO TABS: 1.0000 | ORAL_TABLET | ORAL | Status: DC | PRN

## 2018-05-18 MED ORDER — MAGNESIUM OXIDE 400 (241.3 MG) MG PO TABS
200.0000 mg | ORAL_TABLET | Freq: Every day | ORAL | Status: DC
  Administered 2018-05-18: 200 mg via ORAL
  Filled 2018-05-18: qty 1

## 2018-05-18 MED ORDER — FENTANYL CITRATE (PF) 250 MCG/5ML IJ SOLN
INTRAMUSCULAR | Status: AC
  Filled 2018-05-18: qty 5

## 2018-05-18 MED ORDER — METOCLOPRAMIDE HCL 5 MG PO TABS: 5.0000 mg | ORAL_TABLET | Freq: Three times a day (TID) | ORAL | Status: DC | PRN

## 2018-05-18 MED ORDER — POLYETHYLENE GLYCOL 3350 17 G PO PACK: 17.0000 g | PACK | Freq: Every day | ORAL | Status: DC | PRN

## 2018-05-18 MED ORDER — FENTANYL CITRATE (PF) 100 MCG/2ML IJ SOLN: 25.0000 ug | INTRAMUSCULAR | Status: DC | PRN

## 2018-05-18 MED ORDER — BUPIVACAINE-EPINEPHRINE 0.25% -1:200000 IJ SOLN
INTRAMUSCULAR | Status: DC | PRN
  Administered 2018-05-18: 8 mL

## 2018-05-18 MED ORDER — FENTANYL CITRATE (PF) 100 MCG/2ML IJ SOLN
INTRAMUSCULAR | Status: DC | PRN
  Administered 2018-05-18: 50 ug via INTRAVENOUS

## 2018-05-18 MED ORDER — ATORVASTATIN CALCIUM 40 MG PO TABS
40.0000 mg | ORAL_TABLET | Freq: Every evening | ORAL | Status: DC
  Administered 2018-05-18: 40 mg via ORAL
  Filled 2018-05-18: qty 1

## 2018-05-18 MED ORDER — PROPOFOL 10 MG/ML IV BOLUS
INTRAVENOUS | Status: DC | PRN
  Administered 2018-05-18: 100 mg via INTRAVENOUS

## 2018-05-18 MED ORDER — LABETALOL HCL 5 MG/ML IV SOLN
INTRAVENOUS | Status: AC
  Filled 2018-05-18: qty 4

## 2018-05-18 MED ORDER — LABETALOL HCL 5 MG/ML IV SOLN
INTRAVENOUS | Status: DC | PRN
  Administered 2018-05-18: 10 mg via INTRAVENOUS

## 2018-05-18 MED ORDER — INSULIN DETEMIR 100 UNIT/ML ~~LOC~~ SOLN
35.0000 [IU] | Freq: Two times a day (BID) | SUBCUTANEOUS | Status: DC
  Administered 2018-05-18 – 2018-05-19 (×2): 35 [IU] via SUBCUTANEOUS
  Filled 2018-05-18 (×2): qty 0.35

## 2018-05-18 MED ORDER — LIDOCAINE 2% (20 MG/ML) 5 ML SYRINGE
INTRAMUSCULAR | Status: DC | PRN
  Administered 2018-05-18: 40 mg via INTRAVENOUS

## 2018-05-18 MED ORDER — ACETAMINOPHEN 500 MG PO TABS: 500.0000 mg | ORAL_TABLET | Freq: Two times a day (BID) | ORAL | Status: DC | PRN

## 2018-05-18 MED ORDER — AMLODIPINE BESYLATE 5 MG PO TABS
5.0000 mg | ORAL_TABLET | Freq: Every day | ORAL | Status: DC
  Administered 2018-05-19: 5 mg via ORAL
  Filled 2018-05-18: qty 1

## 2018-05-18 MED ORDER — MENTHOL 3 MG MT LOZG: 1.0000 | LOZENGE | OROMUCOSAL | Status: DC | PRN

## 2018-05-18 MED ORDER — VITAMIN B-12 1000 MCG PO TABS
2000.0000 ug | ORAL_TABLET | Freq: Every day | ORAL | Status: DC
  Administered 2018-05-19: 2000 ug via ORAL
  Filled 2018-05-18: qty 2

## 2018-05-18 MED ORDER — DEXAMETHASONE SODIUM PHOSPHATE 10 MG/ML IJ SOLN
INTRAMUSCULAR | Status: DC | PRN
  Administered 2018-05-18: 10 mg via INTRAVENOUS

## 2018-05-18 MED ORDER — VITAMIN D 1000 UNITS PO TABS
2000.0000 [IU] | ORAL_TABLET | Freq: Every day | ORAL | Status: DC
  Administered 2018-05-19: 2000 [IU] via ORAL
  Filled 2018-05-18: qty 2

## 2018-05-18 MED ORDER — 0.9 % SODIUM CHLORIDE (POUR BTL) OPTIME
TOPICAL | Status: DC | PRN
  Administered 2018-05-18: 1000 mL

## 2018-05-18 MED ORDER — FENTANYL CITRATE (PF) 100 MCG/2ML IJ SOLN
INTRAMUSCULAR | Status: AC
  Administered 2018-05-18: 50 ug
  Filled 2018-05-18: qty 2

## 2018-05-18 MED ORDER — ASPIRIN EC 81 MG PO TBEC
81.0000 mg | DELAYED_RELEASE_TABLET | Freq: Every morning | ORAL | Status: DC
  Administered 2018-05-19: 81 mg via ORAL
  Filled 2018-05-18: qty 1

## 2018-05-18 MED ORDER — DOCUSATE SODIUM 100 MG PO CAPS
100.0000 mg | ORAL_CAPSULE | Freq: Two times a day (BID) | ORAL | Status: DC
  Administered 2018-05-18 – 2018-05-19 (×2): 100 mg via ORAL
  Filled 2018-05-18 (×2): qty 1

## 2018-05-18 MED ORDER — HYDRALAZINE HCL 20 MG/ML IJ SOLN
INTRAMUSCULAR | Status: AC
  Filled 2018-05-18: qty 1

## 2018-05-18 MED ORDER — PHENOL 1.4 % MT LIQD: 1.0000 | OROMUCOSAL | Status: DC | PRN

## 2018-05-18 MED ORDER — PEN NEEDLES 31G X 5 MM MISC: 2.0000 | Freq: Every day | Status: DC

## 2018-05-18 MED ORDER — SUCCINYLCHOLINE CHLORIDE 200 MG/10ML IV SOSY
PREFILLED_SYRINGE | INTRAVENOUS | Status: AC
  Filled 2018-05-18: qty 10

## 2018-05-18 MED ORDER — PROPOFOL 10 MG/ML IV BOLUS
INTRAVENOUS | Status: AC
  Filled 2018-05-18: qty 20

## 2018-05-18 MED ORDER — ONDANSETRON HCL 4 MG/2ML IJ SOLN: 4.0000 mg | Freq: Four times a day (QID) | INTRAMUSCULAR | Status: DC | PRN

## 2018-05-18 MED ORDER — PIOGLITAZONE HCL 30 MG PO TABS
30.0000 mg | ORAL_TABLET | Freq: Every day | ORAL | Status: DC
  Administered 2018-05-19: 30 mg via ORAL
  Filled 2018-05-18: qty 1

## 2018-05-18 MED ORDER — BUPIVACAINE-EPINEPHRINE (PF) 0.25% -1:200000 IJ SOLN
INTRAMUSCULAR | Status: AC
  Filled 2018-05-18: qty 30

## 2018-05-18 MED ORDER — PHENYLEPHRINE 40 MCG/ML (10ML) SYRINGE FOR IV PUSH (FOR BLOOD PRESSURE SUPPORT)
PREFILLED_SYRINGE | INTRAVENOUS | Status: DC | PRN
  Administered 2018-05-18: 80 ug via INTRAVENOUS

## 2018-05-18 MED ORDER — ACETAMINOPHEN 325 MG PO TABS
325.0000 mg | ORAL_TABLET | Freq: Four times a day (QID) | ORAL | Status: DC | PRN
  Administered 2018-05-19: 650 mg via ORAL
  Filled 2018-05-18: qty 2

## 2018-05-18 MED ORDER — MIDAZOLAM HCL 2 MG/2ML IJ SOLN
INTRAMUSCULAR | Status: AC
  Filled 2018-05-18: qty 2

## 2018-05-18 MED ORDER — IRBESARTAN 300 MG PO TABS
300.0000 mg | ORAL_TABLET | Freq: Every day | ORAL | Status: DC
  Administered 2018-05-18 – 2018-05-19 (×2): 300 mg via ORAL
  Filled 2018-05-18 (×2): qty 1

## 2018-05-18 MED ORDER — LACTATED RINGERS IV SOLN
INTRAVENOUS | Status: DC
  Administered 2018-05-18 (×2): via INTRAVENOUS

## 2018-05-18 MED ORDER — GUAIFENESIN ER 600 MG PO TB12: 600.0000 mg | ORAL_TABLET | Freq: Two times a day (BID) | ORAL | Status: DC | PRN

## 2018-05-18 MED ORDER — ONDANSETRON HCL 4 MG PO TABS: 4.0000 mg | ORAL_TABLET | Freq: Four times a day (QID) | ORAL | Status: DC | PRN

## 2018-05-18 MED ORDER — DEXAMETHASONE SODIUM PHOSPHATE 10 MG/ML IJ SOLN
INTRAMUSCULAR | Status: AC
  Filled 2018-05-18: qty 1

## 2018-05-18 MED ORDER — SERTRALINE HCL 100 MG PO TABS
100.0000 mg | ORAL_TABLET | Freq: Every day | ORAL | Status: DC
  Administered 2018-05-19: 100 mg via ORAL
  Filled 2018-05-18: qty 1

## 2018-05-18 MED ORDER — HYDROCODONE-ACETAMINOPHEN 5-325 MG PO TABS: 1.0000 | ORAL_TABLET | Freq: Four times a day (QID) | ORAL | 0 refills | Status: AC | PRN

## 2018-05-18 MED ORDER — SODIUM CHLORIDE 0.9 % IV SOLN
INTRAVENOUS | Status: DC | PRN
  Administered 2018-05-18: 100 ug/min via INTRAVENOUS

## 2018-05-18 MED ORDER — ONDANSETRON HCL 4 MG/2ML IJ SOLN: 4.0000 mg | Freq: Once | INTRAMUSCULAR | Status: DC | PRN

## 2018-05-18 MED ORDER — LATANOPROST 0.005 % OP SOLN
1.0000 [drp] | Freq: Every day | OPHTHALMIC | Status: DC
  Administered 2018-05-19: 1 [drp] via OPHTHALMIC
  Filled 2018-05-18: qty 2.5

## 2018-05-18 MED ORDER — METOCLOPRAMIDE HCL 5 MG/ML IJ SOLN: 5.0000 mg | Freq: Three times a day (TID) | INTRAMUSCULAR | Status: DC | PRN

## 2018-05-18 MED ORDER — CEFAZOLIN SODIUM-DEXTROSE 2-4 GM/100ML-% IV SOLN
2.0000 g | Freq: Four times a day (QID) | INTRAVENOUS | Status: AC
  Administered 2018-05-18 – 2018-05-19 (×3): 2 g via INTRAVENOUS
  Filled 2018-05-18 (×3): qty 100

## 2018-05-18 MED ORDER — LINAGLIPTIN 5 MG PO TABS
5.0000 mg | ORAL_TABLET | Freq: Every day | ORAL | Status: DC
  Administered 2018-05-19: 5 mg via ORAL
  Filled 2018-05-18: qty 1

## 2018-05-18 MED ORDER — CHLORHEXIDINE GLUCONATE 4 % EX LIQD: 60.0000 mL | Freq: Once | CUTANEOUS | Status: DC

## 2018-05-18 MED ORDER — INSULIN ASPART 100 UNIT/ML ~~LOC~~ SOLN: 0.0000 [IU] | Freq: Every day | SUBCUTANEOUS | Status: DC

## 2018-05-18 MED ORDER — MAGNESIUM HYDROXIDE 400 MG/5ML PO SUSP: 30.0000 mL | Freq: Every day | ORAL | Status: DC | PRN

## 2018-05-18 MED ORDER — ONDANSETRON HCL 4 MG/2ML IJ SOLN
INTRAMUSCULAR | Status: AC
  Filled 2018-05-18: qty 2

## 2018-05-18 MED ORDER — INSULIN ASPART 100 UNIT/ML ~~LOC~~ SOLN
0.0000 [IU] | Freq: Three times a day (TID) | SUBCUTANEOUS | Status: DC
  Administered 2018-05-18: 3 [IU] via SUBCUTANEOUS

## 2018-05-18 MED ORDER — SODIUM CHLORIDE 0.9 % IV SOLN
INTRAVENOUS | Status: DC
  Administered 2018-05-18: 18:00:00 via INTRAVENOUS

## 2018-05-18 MED ORDER — SUCCINYLCHOLINE CHLORIDE 200 MG/10ML IV SOSY
PREFILLED_SYRINGE | INTRAVENOUS | Status: DC | PRN
  Administered 2018-05-18: 60 mg via INTRAVENOUS

## 2018-05-18 MED ORDER — GUAIFENESIN-DM 100-10 MG/5ML PO SYRP: 20.0000 mL | ORAL_SOLUTION | Freq: Every evening | ORAL | Status: DC | PRN

## 2018-05-18 MED ORDER — BUPIVACAINE HCL (PF) 0.5 % IJ SOLN
INTRAMUSCULAR | Status: DC | PRN
  Administered 2018-05-18: 15 mL via PERINEURAL

## 2018-05-18 MED ORDER — BUPIVACAINE LIPOSOME 1.3 % IJ SUSP
INTRAMUSCULAR | Status: DC | PRN
  Administered 2018-05-18: 10 mL via PERINEURAL

## 2018-05-18 SURGICAL SUPPLY — 79 items
AID PSTN UNV HD RSTRNT DISP (MISCELLANEOUS) ×1
BASEPLATE GLENOSPHERE 25 STD (Miscellaneous) ×1 IMPLANT
BASEPLATE GLENOSPHERE 25MM STD (Miscellaneous) ×1 IMPLANT
BIT DRILL 3.2 PERIPHERAL SCREW (BIT) ×2 IMPLANT
BIT DRILL 5/64X5 DISP (BIT) ×3 IMPLANT
BLADE SAG 18X100X1.27 (BLADE) ×3 IMPLANT
BONE SCREW THREAD 6.5X35MM (Screw) ×1 IMPLANT
BSPLAT GLND STD 25 RVRS SHLDR (Miscellaneous) ×1 IMPLANT
CLOSURE STERI-STRIP 1/2X4 (GAUZE/BANDAGES/DRESSINGS) ×1
CLOSURE WOUND 1/2 X4 (GAUZE/BANDAGES/DRESSINGS) ×1
CLSR STERI-STRIP ANTIMIC 1/2X4 (GAUZE/BANDAGES/DRESSINGS) ×1 IMPLANT
COVER SURGICAL LIGHT HANDLE (MISCELLANEOUS) ×3 IMPLANT
DRAPE INCISE IOBAN 66X45 STRL (DRAPES) ×3 IMPLANT
DRAPE ORTHO SPLIT 77X108 STRL (DRAPES) ×6
DRAPE SURG ORHT 6 SPLT 77X108 (DRAPES) ×2 IMPLANT
DRAPE U-SHAPE 47X51 STRL (DRAPES) ×3 IMPLANT
DRSG ADAPTIC 3X8 NADH LF (GAUZE/BANDAGES/DRESSINGS) ×3 IMPLANT
DRSG PAD ABDOMINAL 8X10 ST (GAUZE/BANDAGES/DRESSINGS) ×3 IMPLANT
DURAPREP 26ML APPLICATOR (WOUND CARE) ×3 IMPLANT
ELECT BLADE 4.0 EZ CLEAN MEGAD (MISCELLANEOUS) ×3
ELECT NDL TIP 2.8 STRL (NEEDLE) ×1 IMPLANT
ELECT NEEDLE TIP 2.8 STRL (NEEDLE) ×3 IMPLANT
ELECT REM PT RETURN 9FT ADLT (ELECTROSURGICAL) ×3
ELECTRODE BLDE 4.0 EZ CLN MEGD (MISCELLANEOUS) ×1 IMPLANT
ELECTRODE REM PT RTRN 9FT ADLT (ELECTROSURGICAL) ×1 IMPLANT
GAUZE SPONGE 4X4 12PLY STRL (GAUZE/BANDAGES/DRESSINGS) ×3 IMPLANT
GAUZE SPONGE 4X4 12PLY STRL LF (GAUZE/BANDAGES/DRESSINGS) ×2 IMPLANT
GLENOSPHERE PERFORM 39 3 (Shoulder) ×3 IMPLANT
GLOVE BIOGEL PI ORTHO PRO 7.5 (GLOVE) ×2
GLOVE BIOGEL PI ORTHO PRO SZ8 (GLOVE) ×2
GLOVE ORTHO TXT STRL SZ7.5 (GLOVE) ×3 IMPLANT
GLOVE PI ORTHO PRO STRL 7.5 (GLOVE) ×1 IMPLANT
GLOVE PI ORTHO PRO STRL SZ8 (GLOVE) ×1 IMPLANT
GLOVE SURG ORTHO 8.5 STRL (GLOVE) ×3 IMPLANT
GOWN STRL REUS W/ TWL LRG LVL3 (GOWN DISPOSABLE) ×1 IMPLANT
GOWN STRL REUS W/ TWL XL LVL3 (GOWN DISPOSABLE) ×2 IMPLANT
GOWN STRL REUS W/TWL LRG LVL3 (GOWN DISPOSABLE) ×3
GOWN STRL REUS W/TWL XL LVL3 (GOWN DISPOSABLE) ×6
GUIDEWIRE GLENOID 2.5X220 (WIRE) ×2 IMPLANT
INSERT REV KIT SHOULDER 6X39 (Screw) ×2 IMPLANT
KIT BASIN OR (CUSTOM PROCEDURE TRAY) ×3 IMPLANT
KIT TURNOVER KIT B (KITS) ×3 IMPLANT
MANIFOLD NEPTUNE II (INSTRUMENTS) ×3 IMPLANT
NDL 1/2 CIR MAYO (NEEDLE) ×1 IMPLANT
NDL HYPO 25GX1X1/2 BEV (NEEDLE) ×1 IMPLANT
NEEDLE 1/2 CIR MAYO (NEEDLE) ×3 IMPLANT
NEEDLE HYPO 25GX1X1/2 BEV (NEEDLE) ×3 IMPLANT
NS IRRIG 1000ML POUR BTL (IV SOLUTION) ×3 IMPLANT
PACK SHOULDER (CUSTOM PROCEDURE TRAY) ×3 IMPLANT
PAD ABD 8X10 STRL (GAUZE/BANDAGES/DRESSINGS) ×2 IMPLANT
PAD ARMBOARD 7.5X6 YLW CONV (MISCELLANEOUS) ×6 IMPLANT
RESTRAINT HEAD UNIVERSAL NS (MISCELLANEOUS) ×3 IMPLANT
SCREW 5.0X38 SMALL F/PERFORM (Screw) ×2 IMPLANT
SCREW 5.5X14 (Screw) ×2 IMPLANT
SCREW BONE THREAD 6.5X35 (Screw) ×1 IMPLANT
SCREW PERIPHERAL 5.0X46 (Screw) ×2 IMPLANT
SLING ARM IMMOBILIZER LRG (SOFTGOODS) ×2 IMPLANT
SPHERE GLENOD +3X39XLATERALIZE (Shoulder) IMPLANT
SPHR GLND +3X39XLATERALIZE (Shoulder) ×1 IMPLANT
SPONGE LAP 18X18 X RAY DECT (DISPOSABLE) IMPLANT
SPONGE LAP 4X18 RFD (DISPOSABLE) ×3 IMPLANT
STEM HUMERAL SZ4B STD 78 PTC (Stem) ×2 IMPLANT
STRIP CLOSURE SKIN 1/2X4 (GAUZE/BANDAGES/DRESSINGS) ×2 IMPLANT
SUCTION FRAZIER HANDLE 10FR (MISCELLANEOUS) ×2
SUCTION TUBE FRAZIER 10FR DISP (MISCELLANEOUS) ×1 IMPLANT
SUT FIBERWIRE #2 38 T-5 BLUE (SUTURE)
SUT MNCRL AB 4-0 PS2 18 (SUTURE) ×3 IMPLANT
SUT VIC AB 0 CT2 27 (SUTURE) ×3 IMPLANT
SUT VIC AB 2-0 CT1 27 (SUTURE) ×3
SUT VIC AB 2-0 CT1 TAPERPNT 27 (SUTURE) ×1 IMPLANT
SUT VICRYL 0 CT 1 36IN (SUTURE) ×3 IMPLANT
SUTURE FIBERWR #2 38 T-5 BLUE (SUTURE) IMPLANT
SYR CONTROL 10ML LL (SYRINGE) ×3 IMPLANT
TAPE CLOTH SURG 4X10 WHT LF (GAUZE/BANDAGES/DRESSINGS) ×2 IMPLANT
TOWEL OR 17X24 6PK STRL BLUE (TOWEL DISPOSABLE) ×3 IMPLANT
TOWEL OR 17X26 10 PK STRL BLUE (TOWEL DISPOSABLE) ×3 IMPLANT
TOWER CARTRIDGE SMART MIX (DISPOSABLE) IMPLANT
TRAY TIB REV AEQ 0MM (Shoulder) ×2 IMPLANT
YANKAUER SUCT BULB TIP NO VENT (SUCTIONS) ×3 IMPLANT

## 2018-05-18 NOTE — Interval H&P Note (Signed)
History and Physical Interval Note:  05/18/2018 9:42 AM  [name]  has presented today for surgery, with the diagnosis of right shoulder osteoarthritis, rotator cuff insufficiency  The various methods of treatment have been discussed with the patient and family. After consideration of risks, benefits and other options for treatment, the patient has consented to  Procedure(s): REVERSE RIGHT SHOULDER ARTHROPLASTY (Right) as a surgical intervention .  The patient's history has been reviewed, patient examined, no change in status, stable for surgery.  I have reviewed the patient's chart and labs.  Questions were answered to the patient's satisfaction.     [name],STEVEN R

## 2018-05-18 NOTE — Brief Op Note (Signed)
05/18/2018  1:05 PM  PATIENT:  [name]  84 y.o. female  PRE-OPERATIVE DIAGNOSIS:  right shoulder osteoarthritis, rotator cuff insufficiency  POST-OPERATIVE DIAGNOSIS:  right shoulder osteoarthritis, rotator cuff insufficiency  PROCEDURE:  Procedure(s): REVERSE RIGHT SHOULDER ARTHROPLASTY (Right) Tornier Aqualis Reverse  SURGEON:  Surgeon(s) and Role:    Netta Cedars, MD - Primary  PHYSICIAN ASSISTANT:   ASSISTANTS: [place] Bruns, PA-C   ANESTHESIA:   regional and general  EBL:  100 mL   BLOOD ADMINISTERED:none  DRAINS: none   LOCAL MEDICATIONS USED:  MARCAINE     SPECIMEN:  No Specimen  DISPOSITION OF SPECIMEN:  N/A  COUNTS:  YES  TOURNIQUET:  * No tourniquets in log *  DICTATION: .Other Dictation: Dictation Number [id number]  PLAN OF CARE: Admit to inpatient   PATIENT DISPOSITION:  PACU - hemodynamically stable.   Delay start of Pharmacological VTE agent (>24hrs) due to surgical blood loss or risk of bleeding: no

## 2018-05-18 NOTE — Transfer of Care (Signed)
Immediate Anesthesia Transfer of Care Note  Patient: [name]  Procedure(s) Performed: REVERSE RIGHT SHOULDER ARTHROPLASTY (Right Shoulder)  Patient Location: PACU  Anesthesia Type:GA combined with regional for post-op pain  Level of Consciousness: awake, oriented and patient cooperative  Airway & Oxygen Therapy: Patient Spontanous Breathing and Patient connected to nasal cannula oxygen  Post-op Assessment: Report given to RN, Post -op Vital signs reviewed and stable and Patient moving all extremities  Post vital signs: Reviewed and stable  Last Vitals:  Vitals Value Taken Time  BP 148/59 05/18/2018 12:56 PM  Temp    Pulse 74 05/18/2018 12:56 PM  Resp 18 05/18/2018 12:56 PM  SpO2 100 % 05/18/2018 12:56 PM  Vitals shown include unvalidated device data.  Last Pain:  Vitals:   05/18/18 1014  TempSrc:   PainSc: 0-No pain      Patients Stated Pain Goal: 2 (93/26/71 2458)  Complications: No apparent anesthesia complications

## 2018-05-18 NOTE — Op Note (Signed)
NAME: [name], [name] MEDICAL RECORD [id number] ACCOUNT [id number] DATE OF BIRTH:[birth year] FACILITY: [facility] LOCATION: [facility] PHYSICIAN:STEVEN Orlena Sheldon, MD  OPERATIVE REPORT  DATE OF PROCEDURE:  05/18/2018  PREOPERATIVE DIAGNOSIS:  Right shoulder rotator cuff tear arthropathy.  POSTOPERATIVE DIAGNOSIS:  Right shoulder rotator cuff tear arthropathy.  PROCEDURE PERFORMED:  Right reverse total shoulder arthroplasty using a Tornier Aequalis reverse system.  ATTENDING SURGEON:  Esmond Plants, MD  ASSISTANT:  Darol Destine, [place], who was scrubbed during the entire procedure and necessary for satisfactory completion of surgery.  ANESTHESIA:  General anesthesia was used plus interscalene block.  ESTIMATED BLOOD LOSS: 150 mL  FLUID REPLACEMENT:  1500 mL crystalloid.  INSTRUMENT COUNTS:  Correct.  COMPLICATIONS:  No complications.  ANTIBIOTICS:  Perioperative antibiotics were given.  INDICATIONS:  The patient is an 84 year old female with worsening right shoulder pain secondary to rotator cuff tear arthropathy.  The patient has had progressive pain despite conservative management.  She desires operative treatment to restore function  and limit pain.  Informed consent obtained.  DESCRIPTION OF PROCEDURE:  After an adequate level of anesthesia was achieved, the patient was positioned in the modified beach chair position.  Right shoulder correctly identified and sterilely prepped and draped in the usual manner.  Timeout called.   We used a standard deltopectoral approach to enter the shoulder using a 10 blade scalpel starting in the coracoid process extending down to the anterior humerus.  Dissection down through subcutaneous tissues.  Cephalic vein identified and taken laterally  with the deltoid pectoralis taken medially.  Conjoined tendon identified and retracted medially.  We went ahead and tenodesed the biceps in situ with 0 Vicryl figure-of-eight suture incorporating the  pectoralis tendon.  We next released the  subscapularis remnant off the lesser tuberosity.  This was in bad shape and not amenable to repair.  We performed a release of the capsule progressively externally rotating.  We released the biceps and the anterior portion of the rotator cuff including  supraspinatus and half of the infraspinatus to expose the shoulder.  We extended the shoulder delivering of the wound.  We then made a 20 degree retroverted cut using the intramedullary and the external alignment guides.  Once we had that cut made, we  removed excess osteophytes with a rongeur.  There were multiple large osteophytes that were removed.  Next, we subluxed the humerus posteriorly.  We had a 360 degree capsular labral excision exposing the glenoid.  We had good visualization of the  glenoid.  There was advanced arthritis noted with really no cartilage whatsoever and intense synovitis throughout the joint.  The axillary nerve was protected during the procedure.  We found our center point for our guide pin and then did our reaming for  a 25 mm baseplate.  Once we had that reaming done, we drilled our central boss hole out.  We also drilled our hole for our screw.  We impacted a 25 baseplate in place for the 20 Aequalis system.  We measured the central screw diameter, which was a 6.5  screw.  We measured our central screw length as well, which was a 35 mm length.  We went ahead and screwed the baseplate into position.  We then placed locked screw superiorly, inferiorly and then a nonlocked anteriorly.  We had good grades baseplate  security.  At this point, we selected the 39+3 lateralized glenosphere and impacted that in position and then went ahead and screwed the central set screw.  We did  a finger sweep.  The axillary nerve was free and clear.  We had a nice lateralization with  that glenosphere.  We next approached the lateral side and did our final preparation there with our first intramedullary  sounding and then sizing the canal to a size 3-4 and then using our first 1, then 2, then 3 then 4 broaches and then trialled with  the central tray and the +6 poly.  We felt like that gave [place] good fit and a nice tight shoulder.  No gapping at all with any position and no impingement.  We irrigated thoroughly, removed the trial components from the humeral side.  Did press-fit technique on the humeral side with the press-fit real stem and this was a size 4 short stem and impacted that in position and then we used the standard central  tray and impacted that in position and then selected a +6 poly for the 39 head and impacted that into position with the appropriate orientation.  We then reduced the shoulder and get a nice stability.  Irrigated thoroughly.  Removed the subscap remnant  and then repaired the deltopectoral interval with 0 Vicryl suture followed by 2-0 Vicryl for subcutaneous closure and 4-0 Monocryl for skin.  Steri-Strips applied followed by sterile dressing.  The patient tolerated surgery well.  TN/NUANCE  D:05/18/2018 T:05/18/2018 JOB:[id number]

## 2018-05-18 NOTE — Discharge Instructions (Signed)
Ice to the shoulder as much as possible.  Keep the incision clean and dry and intact for one week, then can get it wet in the shower.  Do exercises as instructed.  Do NOT push up your full weight out of the chair and do NOT reach behind you.  You may remove the sling as you would like.  Follow up in the office in two weeks for a check with Dr Veverly Fells, call [phone] for appt.

## 2018-05-18 NOTE — Anesthesia Procedure Notes (Signed)
Anesthesia Regional Block: Interscalene brachial plexus block   Pre-Anesthetic Checklist: ,, timeout performed, Correct Patient, Correct Site, Correct Laterality, Correct Procedure, Correct Position, site marked, Risks and benefits discussed,  Surgical consent,  Pre-op evaluation,  At surgeon's request and post-op pain management  Laterality: Right  Prep: chloraprep       Needles:  Injection technique: Single-shot  Needle Type: Echogenic Stimulator Needle     Needle Length: 9cm  Needle Gauge: 21     Additional Needles:   Procedures:,,,, ultrasound used (permanent image in chart),,,,  Narrative:  Start time: 05/18/2018 10:20 AM End time: 05/18/2018 10:30 AM Injection made incrementally with aspirations every 5 mL.  Performed by: Personally  Anesthesiologist: Murvin Natal, MD  Additional Notes: Functioning IV was confirmed and monitors were applied.  A timeout was performed. Sterile prep, hand hygiene and sterile gloves were used. A 78mm 21ga Arrow echogenic stimulator needle was used. Negative aspiration and negative test dose prior to incremental administration of local anesthetic. The patient tolerated the procedure well.  Ultrasound guidance: relevent anatomy identified, needle position confirmed, local anesthetic spread visualized around nerve(s), vascular puncture avoided.  Image printed for medical record.

## 2018-05-18 NOTE — Anesthesia Procedure Notes (Signed)
Procedure Name: Intubation Date/Time: 05/18/2018 10:39 AM Performed by: Moshe Salisbury, CRNA Pre-anesthesia Checklist: Patient identified, Emergency Drugs available, Suction available and Patient being monitored Patient Re-evaluated:Patient Re-evaluated prior to induction Oxygen Delivery Method: Circle System Utilized Preoxygenation: Pre-oxygenation with 100% oxygen Induction Type: IV induction Ventilation: Mask ventilation without difficulty Laryngoscope Size: Mac and 3 Grade View: Grade II Tube type: Oral Tube size: 7.5 mm Number of attempts: 1 Airway Equipment and Method: Stylet Placement Confirmation: ETT inserted through vocal cords under direct vision,  positive ETCO2 and breath sounds checked- equal and bilateral Secured at: 21 cm Tube secured with: Tape Dental Injury: Teeth and Oropharynx as per pre-operative assessment

## 2018-05-18 NOTE — Anesthesia Preprocedure Evaluation (Addendum)
Anesthesia Evaluation  Patient identified by MRN, date of birth, ID band Patient awake    Reviewed: Allergy & Precautions, NPO status , Patient's Chart, lab work & pertinent test results  Airway Mallampati: III  TM Distance: >3 FB Neck ROM: Full    Dental  (+) Edentulous Upper, Edentulous Lower   Pulmonary former smoker,    Pulmonary exam normal breath sounds clear to auscultation       Cardiovascular hypertension, Pt. on medications Normal cardiovascular exam Rhythm:Regular Rate:Normal  ECG: Rate 72. Sinus rhythm with Premature atrial complexes. Possible Left atrial enlargement   Neuro/Psych PSYCHIATRIC DISORDERS Anxiety Depression negative neurological ROS     GI/Hepatic Neg liver ROS,   Endo/Other  diabetes, Insulin Dependent, Oral Hypoglycemic Agents  Renal/GU negative Renal ROS     Musculoskeletal negative musculoskeletal ROS (+)   Abdominal   Peds  Hematology HLD   Anesthesia Other Findings right shoulder osteoarthritis rotator cuff insufficiency  Reproductive/Obstetrics                            Anesthesia Physical Anesthesia Plan  ASA: II  Anesthesia Plan: General and Regional   Post-op Pain Management: GA combined w/ Regional for post-op pain   Induction: Intravenous  PONV Risk Score and Plan: 3 and Ondansetron, Dexamethasone and Treatment may vary due to age or medical condition  Airway Management Planned: Oral ETT  Additional Equipment:   Intra-op Plan:   Post-operative Plan: Extubation in OR  Informed Consent: I have reviewed the patients History and Physical, chart, labs and discussed the procedure including the risks, benefits and alternatives for the proposed anesthesia with the patient or authorized representative who has indicated his/her understanding and acceptance.   Dental advisory given  Plan Discussed with: CRNA  Anesthesia Plan Comments:          Anesthesia Quick Evaluation

## 2018-05-19 ENCOUNTER — Other Ambulatory Visit: Payer: Self-pay

## 2018-05-19 LAB — BASIC METABOLIC PANEL
ANION GAP: 9 (ref 5–15)
BUN: 25 mg/dL — ABNORMAL HIGH (ref 8–23)
CALCIUM: 9.4 mg/dL (ref 8.9–10.3)
CO2: 21 mmol/L — AB (ref 22–32)
Chloride: 107 mmol/L (ref 98–111)
Creatinine, Ser: 1.4 mg/dL — ABNORMAL HIGH (ref 0.44–1.00)
GFR, EST AFRICAN AMERICAN: 38 mL/min — AB (ref >60)
GFR, EST NON AFRICAN AMERICAN: 33 mL/min — AB (ref >60)
Glucose, Bld: 146 mg/dL — ABNORMAL HIGH (ref 70–99)
POTASSIUM: 4.4 mmol/L (ref 3.5–5.1)
Sodium: 137 mmol/L (ref 135–145)

## 2018-05-19 LAB — HEMOGLOBIN AND HEMATOCRIT, BLOOD
HEMATOCRIT: 34.3 % — AB (ref 36.0–46.0)
HEMOGLOBIN: 10.9 g/dL — AB (ref 12.0–15.0)

## 2018-05-19 LAB — GLUCOSE, CAPILLARY: Glucose-Capillary: 108 mg/dL — ABNORMAL HIGH (ref 70–99)

## 2018-05-19 NOTE — Progress Notes (Signed)
Provided discharge education. No questions asked. Patient stable. Dry dressing applied prior to discharge. Patient discharged home with belongings, accompanied by daughter.

## 2018-05-19 NOTE — Plan of Care (Signed)
  Problem: Education: Goal: Knowledge of General Education information will improve Description Including pain rating scale, medication(s)/side effects and non-pharmacologic comfort measures Outcome: Progressing   Problem: Health Behavior/Discharge Planning: Goal: Ability to manage health-related needs will improve Outcome: Progressing   Problem: Clinical Measurements: Goal: Ability to maintain clinical measurements within normal limits will improve Outcome: Progressing Goal: Will remain free from infection Outcome: Progressing Goal: Diagnostic test results will improve Outcome: Progressing   Problem: Activity: Goal: Risk for activity intolerance will decrease Outcome: Progressing   Problem: Nutrition: Goal: Adequate nutrition will be maintained Outcome: Progressing   Problem: Coping: Goal: Level of anxiety will decrease Outcome: Progressing   Problem: Elimination: Goal: Will not experience complications related to bowel motility Outcome: Progressing   Problem: Pain Managment: Goal: General experience of comfort will improve Outcome: Progressing   Problem: Safety: Goal: Ability to remain free from injury will improve Outcome: Progressing   Problem: Skin Integrity: Goal: Risk for impaired skin integrity will decrease Outcome: Progressing   Problem: Activity: Goal: Ability to tolerate increased activity will improve Outcome: Progressing   Problem: Pain Management: Goal: Pain level will decrease with appropriate interventions Outcome: Progressing

## 2018-05-19 NOTE — Evaluation (Signed)
Occupational Therapy Evaluation and Discharge Patient Details Name: [name] MRN: [id number] DOB: 1929/06/15 Today's Date: 05/19/2018    History of Present Illness s/p R reverse TSA. PMH: HTN, DM, anxiety and depression.    Clinical Impression   Pt is typically independent in use of DME for ADL only. She lives with her daughter and her family will provide 24 hour care when she returns home. Pt and family educated in R UE exercises as per MD's parameters, compensatory strategies for ADL, sling use and positioning R UE in bed and chair. Verbalized understanding of all information, reinforced with shoulder protocol handout. No further OT needs.    Follow Up Recommendations  Follow surgeon's recommendation for DC plan and follow-up therapies    Equipment Recommendations  None recommended by OT    Recommendations for Other Services       Precautions / Restrictions Precautions Precautions: Shoulder Type of Shoulder Precautions: FF to 90, ABD to 60, ER to 30, elbow to hand AROM Shoulder Interventions: Shoulder sling/immobilizer;For comfort(and sleep) Precaution Booklet Issued: Yes (comment) Precaution Comments: reviewed information on shoulder protocol with pt and family Required Braces or Orthoses: Sling Restrictions Weight Bearing Restrictions: Yes RUE Weight Bearing: Non weight bearing      Mobility Bed Mobility Overal bed mobility: Modified Independent             General bed mobility comments: HOB up, instructed to get up to L side of bed to avoid pushing up on R UE  Transfers Overall transfer level: Modified independent Equipment used: None             General transfer comment: slow to rise, but no assist needed    Balance                                           ADL either performed or assessed with clinical judgement   ADL Overall ADL's : Needs assistance/impaired Eating/Feeding: Set up;Sitting   Grooming: Min  guard;Sitting;Wash/dry hands   Upper Body Bathing: Minimal assistance;Sitting   Lower Body Bathing: Moderate assistance;Sit to/from stand   Upper Body Dressing : Moderate assistance;Sitting   Lower Body Dressing: Moderate assistance;Sit to/from stand Lower Body Dressing Details (indicate cue type and reason): recommended elastic waist pants or dresses Toilet Transfer: Modified Independent   Toileting- Clothing Manipulation and Hygiene: Modified independent;Sit to/from stand       Functional mobility during ADLs: Modified independent(slow) General ADL Comments: Educated in positioning R UE in bed and chair, sling use, compensatory strategies for ADL     Vision Baseline Vision/History: Wears glasses Wears Glasses: Reading only Patient Visual Report: No change from baseline       Perception     Praxis      Pertinent Vitals/Pain Pain Assessment: Faces Faces Pain Scale: Hurts a little bit Pain Location: R shoulder Pain Descriptors / Indicators: Sore Pain Intervention(s): Repositioned;Monitored during session;Ice applied     Hand Dominance Right   Extremity/Trunk Assessment Upper Extremity Assessment Upper Extremity Assessment: RUE deficits/detail RUE Deficits / Details: performed AROM of shoulder in supine within MDs parameters, AAROM elbow to hand  x 10 RUE Sensation: decreased light touch;decreased proprioception(nerve block partially intact) RUE Coordination: decreased fine motor;decreased gross motor   Lower Extremity Assessment Lower Extremity Assessment: Overall WFL for tasks assessed   Cervical / Trunk Assessment Cervical / Trunk Assessment: Normal   Communication  Communication Communication: No difficulties   Cognition Arousal/Alertness: Awake/alert Behavior During Therapy: WFL for tasks assessed/performed Overall Cognitive Status: Within Functional Limits for tasks assessed                                     General Comments        Exercises     Shoulder Instructions      Home Living Family/patient expects to be discharged to:: Private residence Living Arrangements: Children Available Help at Discharge: Family;Available 24 hours/day                         Home Equipment: Walker - 2 wheels;Cane - single point;Bedside commode;Shower seat          Prior Functioning/Environment Level of Independence: Independent with assistive device(s)        Comments: walks without a device, mod I for self care        OT Problem List:        OT Treatment/Interventions:      OT Goals(Current goals can be found in the care plan section) Acute Rehab OT Goals Patient Stated Goal: to go home  OT Frequency:     Barriers to D/C:            Co-evaluation              AM-PAC PT "6 Clicks" Daily Activity     Outcome Measure Help from another person eating meals?: A Little Help from another person taking care of personal grooming?: A Little Help from another person toileting, which includes using toliet, bedpan, or urinal?: None Help from another person bathing (including washing, rinsing, drying)?: A Little Help from another person to put on and taking off regular upper body clothing?: A Little Help from another person to put on and taking off regular lower body clothing?: A Lot 6 Click Score: 18   End of Session    Activity Tolerance: Patient tolerated treatment well Patient left: in chair;with call bell/phone within reach;with family/visitor present  OT Visit Diagnosis: Pain                Time: 4008-6761 OT Time Calculation (min): 32 min Charges:  OT General Charges $OT Visit: 1 Visit OT Evaluation $OT Eval Low Complexity: 1 Low OT Treatments $Self Care/Home Management : 8-22 mins  Nestor Lewandowsky, OTR/L Acute Rehabilitation Services Pager: [phone] Office: [phone]  [name] 05/19/2018, 10:16 AM

## 2018-05-19 NOTE — Discharge Summary (Signed)
PATIENT ID:      [name]  MRN:     [id number] DOB/AGE:    [birth year] / 84 y.o.     DISCHARGE SUMMARY  ADMISSION DATE:    05/18/2018 DISCHARGE DATE:    ADMISSION DIAGNOSIS: right shoulder osteoarthritis, rotator cuff insufficiency Past Medical History:  Diagnosis Date  . Anxiety   . Arthritis   . Depression   . Diabetes mellitus without complication ([place])   . GERD (gastroesophageal reflux disease)   . Hyperlipidemia   . Hypertension   . SOBOE (shortness of breath on exertion)   . Varicose veins     DISCHARGE DIAGNOSIS:   Active Problems:   S/P shoulder replacement, right   PROCEDURE: Procedure(s): REVERSE RIGHT SHOULDER ARTHROPLASTY on 05/18/2018  CONSULTS:    HISTORY:  See H&P in chart.  HOSPITAL COURSE:  [name] is a 84 y.o. admitted on 05/18/2018 with a diagnosis of right shoulder osteoarthritis, rotator cuff insufficiency.  They were brought to the operating room on 05/18/2018 and underwent Procedure(s): REVERSE RIGHT SHOULDER ARTHROPLASTY.    They were given perioperative antibiotics:  Anti-infectives (From admission, onward)   Start     Dose/Rate Route Frequency Ordered Stop   05/18/18 1630  ceFAZolin (ANCEF) IVPB 2g/100 mL premix     2 g 200 mL/hr over 30 Minutes Intravenous Every 6 hours 05/18/18 1626 05/19/18 0513   05/18/18 0815  ceFAZolin (ANCEF) IVPB 2g/100 mL premix     2 g 200 mL/hr over 30 Minutes Intravenous On call to O.R. 05/18/18 9024 05/18/18 1025    .  Patient underwent the above named procedure and tolerated it well. The following day they were hemodynamically stable and pain was controlled on oral analgesics. They were neurovascularly intact to the operative extremity. OT was ordered and worked with patient per protocol. They were medically and orthopaedically stable for discharge on day 1 .   DIAGNOSTIC STUDIES:  RECENT RADIOGRAPHIC STUDIES :  Dg Shoulder Right Port  Result Date: 05/18/2018 CLINICAL DATA:  Postop shoulder  replacement EXAM: PORTABLE RIGHT SHOULDER COMPARISON:  06/19/2013 FINDINGS: AC joint is intact. Status post right shoulder replacement with intact hardware and normal alignment. Gas within the soft tissues. IMPRESSION: Status post right shoulder replacement with expected operative changes. Electronically Signed   By: Donavan Foil M.D.   On: 05/18/2018 14:18    RECENT VITAL SIGNS:   Patient Vitals for the past 24 hrs:  BP Temp Temp src Pulse Resp SpO2  05/19/18 0315 (!) 151/60 99.3 F (37.4 C) Oral 87 - 98 %  05/18/18 2342 (!) 160/66 99.5 F (37.5 C) Oral 86 - 94 %  05/18/18 2038 127/68 - - 91 - 96 %  05/18/18 1514 (!) 129/59 97.9 F (36.6 C) Oral 75 15 94 %  05/18/18 1445 (!) 132/58 - - 75 (!) 22 93 %  05/18/18 1430 136/60 - - 76 (!) 23 94 %  05/18/18 1415 (!) 138/59 - - 75 (!) 23 95 %  05/18/18 1400 (!) 142/60 - - 73 19 98 %  05/18/18 1345 (!) 144/57 - - 73 (!) 21 99 %  05/18/18 1330 (!) 140/57 - - 74 (!) 21 99 %  05/18/18 1315 (!) 139/55 - - 75 (!) 24 98 %  05/18/18 1300 - - - 75 (!) 22 100 %  05/18/18 1256 (!) 148/59 (!) 97.5 F (36.4 C) - 74 11 99 %  05/18/18 1010 (!) 170/63 - - 76 17 100 %  05/18/18  1005 (!) 179/54 - - 78 18 100 %  .  RECENT EKG RESULTS:    Orders placed or performed during the hospital encounter of 05/18/18  . EKG 12-Lead  . EKG 12-Lead    DISCHARGE INSTRUCTIONS:  Discharge Instructions    Change dressing   Complete by:  As directed    Please place new dry dressing prior to Dc home      DISCHARGE MEDICATIONS:   Allergies as of 05/19/2018      Reactions   Albuterol Nausea And Vomiting   Doxycycline Nausea And Vomiting      Medication List    STOP taking these medications   pravastatin 20 MG tablet Commonly known as:  PRAVACHOL   valsartan-hydrochlorothiazide 160-12.5 MG tablet Commonly known as:  DIOVAN-HCT     TAKE these medications   acetaminophen 650 MG CR tablet Commonly known as:  TYLENOL Take 650 mg by mouth daily as needed for  pain.   acetaminophen 500 MG tablet Commonly known as:  TYLENOL Take 500 mg by mouth 2 (two) times daily as needed for moderate pain or headache.   amLODipine 5 MG tablet Commonly known as:  NORVASC Take 5 mg by mouth daily.   aspirin 81 MG tablet Take 81 mg by mouth every morning.   atorvastatin 40 MG tablet Commonly known as:  LIPITOR Take 40 mg by mouth every evening.   B-12 2500 MCG Tabs Take 2,500 mcg by mouth daily.   cholecalciferol 1000 units tablet Commonly known as:  VITAMIN D Take 2,000 Units by mouth daily.   diclofenac sodium 1 % Gel Commonly known as:  VOLTAREN Apply 2 g topically 3 (three) times daily as needed. What changed:  reasons to take this   glucose blood test strip Ck blood sugars once daily or as instructed   guaiFENesin 600 MG 12 hr tablet Commonly known as:  MUCINEX Take 600 mg by mouth 2 (two) times daily as needed (congestion).   guaiFENesin-dextromethorphan 100-10 MG/5ML syrup Commonly known as:  ROBITUSSIN DM Take 20 mLs by mouth at bedtime as needed for cough.   HYDROcodone-acetaminophen 5-325 MG tablet Commonly known as:  NORCO/VICODIN Take 1 tablet by mouth every 6 (six) hours as needed for moderate pain.   insulin detemir 100 UNIT/ML injection Commonly known as:  LEVEMIR Inject 35 Units into the skin 2 (two) times daily.   Insulin NPH (Human) (Isophane) 100 UNIT/ML Kiwkpen Commonly known as:  HUMULIN N Inject 53 Units into the skin every morning. And pen needles 1/day   latanoprost 0.005 % ophthalmic solution Commonly known as:  XALATAN Place 1 drop into both eyes daily.   Magnesium 250 MG Tabs Take 250 mg by mouth at bedtime.   magnesium hydroxide 400 MG/5ML suspension Commonly known as:  MILK OF MAGNESIA Per bottle as needed for constipation What changed:    how much to take  how to take this  when to take this  reasons to take this  additional instructions   olmesartan 40 MG tablet Commonly known as:   BENICAR Take 40 mg by mouth daily.   OVER THE COUNTER MEDICATION Apply 1 application topically daily as needed (shoulder pain). Penetrex otc muscle rub   Pen Needles 31G X 5 MM Misc 2 each by Does not apply route daily.   pioglitazone 30 MG tablet Commonly known as:  ACTOS Take 30 mg by mouth daily.   QUEtiapine 25 MG tablet Commonly known as:  SEROQUEL Take 25 mg by  mouth at bedtime as needed for sleep.   sertraline 100 MG tablet Commonly known as:  ZOLOFT TAKE 1 TABLET BY MOUTH DAILY   sitaGLIPtin 100 MG tablet Commonly known as:  JANUVIA Take 100 mg by mouth daily.            Discharge Care Instructions  (From admission, onward)         Start     Ordered   05/19/18 0000  Change dressing    Comments:  Please place new dry dressing prior to Dc home   05/19/18 0263          FOLLOW UP VISIT:   Follow-up Information    Netta Cedars, MD. Call in 2 weeks.   Specialty:  Orthopedic Surgery Why:  [phone] Contact information: [address] [place] [address] [phone]           DISCHARGE TO: Home   DISCHARGE CONDITION:  [name] for Dr. Veverly Fells 05/19/2018, 9:55 AM

## 2018-05-19 NOTE — Progress Notes (Signed)
[name]  MRN: [id number] DOB/Age: 03/19/1929 84 y.o.  Orthopedics Procedure: Procedure(s) (LRB): REVERSE RIGHT SHOULDER ARTHROPLASTY (Right)     Subjective: Doing well , working with OT. Family present, wants to go home  Vital Signs Temp:  [97.5 F (36.4 C)-99.5 F (37.5 C)] 99.3 F (37.4 C) (09/14 0315) Pulse Rate:  [73-91] 87 (09/14 0315) Resp:  [11-24] 15 (09/13 1514) BP: (127-179)/(54-68) 151/60 (09/14 0315) SpO2:  [93 %-100 %] 98 % (09/14 0315)  Lab Results Recent Labs    05/19/18 0419  HGB 10.9*  HCT 34.3*   BMET Recent Labs    05/19/18 0419  NA 137  K 4.4  CL 107  CO2 21*  GLUCOSE 146*  BUN 25*  CREATININE 1.40*  CALCIUM 9.4   No results found for: INR   Exam Block wearing off        Plan DC home after OT  [facility] PA-C  05/19/2018, 9:56 AM Contact # [phone]

## 2018-05-21 ENCOUNTER — Encounter (HOSPITAL_COMMUNITY): Payer: Self-pay | Admitting: Orthopedic Surgery

## 2018-05-24 NOTE — Anesthesia Postprocedure Evaluation (Signed)
Anesthesia Post Note  Patient: [name]  Procedure(s) Performed: REVERSE RIGHT SHOULDER ARTHROPLASTY (Right Shoulder)     Patient location during evaluation: PACU Anesthesia Type: Regional and General Level of consciousness: awake and alert Pain management: pain level controlled Vital Signs Assessment: post-procedure vital signs reviewed and stable Respiratory status: spontaneous breathing, nonlabored ventilation, respiratory function stable and patient connected to nasal cannula oxygen Cardiovascular status: blood pressure returned to baseline and stable Postop Assessment: no apparent nausea or vomiting Anesthetic complications: no    Last Vitals:  Vitals:   05/18/18 2342 05/19/18 0315  BP: (!) 160/66 (!) 151/60  Pulse: 86 87  Resp:    Temp: 37.5 C 37.4 C  SpO2: 94% 98%    Last Pain:  Vitals:   05/19/18 1124  TempSrc:   PainSc: 0-No pain                 Ryan P Ellender

## 2021-10-30 ENCOUNTER — Other Ambulatory Visit: Payer: Self-pay

## 2021-10-30 ENCOUNTER — Ambulatory Visit (INDEPENDENT_AMBULATORY_CARE_PROVIDER_SITE_OTHER): Payer: Medicare Other

## 2021-10-30 ENCOUNTER — Emergency Department (HOSPITAL_COMMUNITY)
Admission: EM | Admit: 2021-10-30 | Discharge: 2021-10-30 | Disposition: A | Discharge status: Home / Self Care | Payer: Medicare Other | Attending: Emergency Medicine | Admitting: Emergency Medicine

## 2021-10-30 ENCOUNTER — Ambulatory Visit
Admission: EM | Admit: 2021-10-30 | Discharge: 2021-10-30 | Disposition: A | Discharge status: Home / Self Care | Payer: Medicare Other | Attending: Internal Medicine | Admitting: Internal Medicine

## 2021-10-30 ENCOUNTER — Emergency Department (HOSPITAL_COMMUNITY): Payer: Medicare Other

## 2021-10-30 DIAGNOSIS — S52202A Unspecified fracture of shaft of left ulna, initial encounter for closed fracture: Secondary | ICD-10-CM

## 2021-10-30 DIAGNOSIS — S52602A Unspecified fracture of lower end of left ulna, initial encounter for closed fracture: Secondary | ICD-10-CM | POA: Insufficient documentation

## 2021-10-30 DIAGNOSIS — Z79899 Other long term (current) drug therapy: Secondary | ICD-10-CM | POA: Insufficient documentation

## 2021-10-30 DIAGNOSIS — W1830XA Fall on same level, unspecified, initial encounter: Secondary | ICD-10-CM | POA: Diagnosis not present

## 2021-10-30 DIAGNOSIS — S6992XA Unspecified injury of left wrist, hand and finger(s), initial encounter: Secondary | ICD-10-CM | POA: Diagnosis present

## 2021-10-30 DIAGNOSIS — S52502A Unspecified fracture of the lower end of left radius, initial encounter for closed fracture: Secondary | ICD-10-CM | POA: Diagnosis not present

## 2021-10-30 DIAGNOSIS — Y92019 Unspecified place in single-family (private) house as the place of occurrence of the external cause: Secondary | ICD-10-CM | POA: Insufficient documentation

## 2021-10-30 DIAGNOSIS — S5292XA Unspecified fracture of left forearm, initial encounter for closed fracture: Secondary | ICD-10-CM | POA: Diagnosis not present

## 2021-10-30 DIAGNOSIS — S62102A Fracture of unspecified carpal bone, left wrist, initial encounter for closed fracture: Secondary | ICD-10-CM

## 2021-10-30 DIAGNOSIS — Z7982 Long term (current) use of aspirin: Secondary | ICD-10-CM | POA: Insufficient documentation

## 2021-10-30 DIAGNOSIS — Z794 Long term (current) use of insulin: Secondary | ICD-10-CM | POA: Insufficient documentation

## 2021-10-30 DIAGNOSIS — T148XXA Other injury of unspecified body region, initial encounter: Secondary | ICD-10-CM

## 2021-10-30 LAB — CBC WITH DIFFERENTIAL/PLATELET
Abs Immature Granulocytes: 0.02 10*3/uL (ref 0.00–0.07)
Basophils Absolute: 0 10*3/uL (ref 0.0–0.1)
Basophils Relative: 1 %
Eosinophils Absolute: 0.1 10*3/uL (ref 0.0–0.5)
Eosinophils Relative: 3 %
HCT: 41.7 % (ref 36.0–46.0)
Hemoglobin: 13.3 g/dL (ref 12.0–15.0)
Immature Granulocytes: 0 %
Lymphocytes Relative: 23 %
Lymphs Abs: 1.2 10*3/uL (ref 0.7–4.0)
MCH: 29 pg (ref 26.0–34.0)
MCHC: 31.9 g/dL (ref 30.0–36.0)
MCV: 90.8 fL (ref 80.0–100.0)
Monocytes Absolute: 0.5 10*3/uL (ref 0.1–1.0)
Monocytes Relative: 10 %
Neutro Abs: 3.3 10*3/uL (ref 1.7–7.7)
Neutrophils Relative %: 63 %
Platelets: 147 10*3/uL — ABNORMAL LOW (ref 150–400)
RBC: 4.59 MIL/uL (ref 3.87–5.11)
RDW: 17.5 % — ABNORMAL HIGH (ref 11.5–15.5)
WBC: 5.2 10*3/uL (ref 4.0–10.5)
nRBC: 0 % (ref 0.0–0.2)

## 2021-10-30 LAB — BASIC METABOLIC PANEL
Anion gap: 9 (ref 5–15)
BUN: 29 mg/dL — ABNORMAL HIGH (ref 8–23)
CO2: 27 mmol/L (ref 22–32)
Calcium: 10.8 mg/dL — ABNORMAL HIGH (ref 8.9–10.3)
Chloride: 103 mmol/L (ref 98–111)
Creatinine, Ser: 1.49 mg/dL — ABNORMAL HIGH (ref 0.44–1.00)
GFR, Estimated: 33 mL/min — ABNORMAL LOW (ref >60)
Glucose, Bld: 164 mg/dL — ABNORMAL HIGH (ref 70–99)
Potassium: 4.3 mmol/L (ref 3.5–5.1)
Sodium: 139 mmol/L (ref 135–145)

## 2021-10-30 MED ORDER — AMLODIPINE BESYLATE 5 MG PO TABS
10.0000 mg | ORAL_TABLET | Freq: Once | ORAL | Status: AC
  Administered 2021-10-30: 10 mg via ORAL
  Filled 2021-10-30: qty 2

## 2021-10-30 MED ORDER — LIDOCAINE HCL (PF) 1 % IJ SOLN
10.0000 mL | Freq: Once | INTRAMUSCULAR | Status: AC
  Administered 2021-10-30: 10 mL
  Filled 2021-10-30: qty 10

## 2021-10-30 MED ORDER — ONDANSETRON 4 MG PO TBDP
4.0000 mg | ORAL_TABLET | Freq: Once | ORAL | Status: AC
  Administered 2021-10-30: 4 mg via ORAL
  Filled 2021-10-30: qty 1

## 2021-10-30 MED ORDER — AMLODIPINE BESYLATE 5 MG PO TABS: 5.0000 mg | ORAL_TABLET | Freq: Once | ORAL | Status: DC

## 2021-10-30 MED ORDER — MORPHINE SULFATE (PF) 2 MG/ML IV SOLN
2.0000 mg | Freq: Once | INTRAVENOUS | Status: AC
  Administered 2021-10-30: 2 mg via INTRAVENOUS
  Filled 2021-10-30: qty 1

## 2021-10-30 MED ORDER — MORPHINE SULFATE (PF) 4 MG/ML IV SOLN
4.0000 mg | Freq: Once | INTRAVENOUS | Status: AC
  Administered 2021-10-30: 4 mg via INTRAVENOUS
  Filled 2021-10-30: qty 1

## 2021-10-30 MED ORDER — HYDRALAZINE HCL 25 MG PO TABS
50.0000 mg | ORAL_TABLET | Freq: Once | ORAL | Status: AC
  Administered 2021-10-30: 50 mg via ORAL
  Filled 2021-10-30: qty 2

## 2021-10-30 NOTE — Discharge Instructions (Addendum)
Call your primary care doctor or specialist as discussed in the next 2-3 days.   Return immediately back to the ER if:  Your symptoms worsen within the next 12-24 hours. You develop new symptoms such as new fevers, persistent vomiting, new pain, shortness of breath, or new weakness or numbness, or if you have any other concerns.  

## 2021-10-30 NOTE — ED Triage Notes (Signed)
Pt here after falling backwards while trying to get into her car today. Pt has pain to lower L arm w/ swelling and deformity. Sent here by [facility]

## 2021-10-30 NOTE — ED Provider Notes (Signed)
[facility]    CSN: [id number] Arrival date & time: 10/30/21  0930      History   Chief Complaint Chief Complaint  Patient presents with   left arm injury    HPI [name] is a 88 y.o. female.   Patient presents with left lower arm and wrist pain after today after a fall.  Patient reports that she fell backwards trying to get into the vehicle and landed on her arm.  Currently having pain in the left wrist and left forearm.  Patient has decreased range of motion and limited strength.  Denies numbness or tingling.  Patient reports that she does take low dose aspirin but no other blood thinning medications.  Denies hitting head or losing consciousness during fall.    Past Medical History:  Diagnosis Date   Anxiety    Arthritis    Depression    Diabetes mellitus without complication (HCC)    GERD (gastroesophageal reflux disease)    Hyperlipidemia    Hypertension    SOBOE (shortness of breath on exertion)    Varicose veins     Patient Active Problem List   Diagnosis Date Noted   S/P shoulder replacement, right 05/18/2018   PAD (peripheral artery disease) ([place]) 03/04/2014   Type 1 diabetes mellitus with neurological manifestations ([place]) 02/13/2014   Arthralgia 07/20/2011   VITAMIN D DEFICIENCY 09/15/2008   Chronic rhinitis 09/07/2007   Diabetes mellitus type II, uncontrolled 07/12/2007   Hyperlipidemia 07/12/2007   Morbid obesity ([place]) 07/12/2007   Depression 07/12/2007   Essential hypertension 07/12/2007   Lumbar pain 07/12/2007    Past Surgical History:  Procedure Laterality Date   APPENDECTOMY  50 years ago   EYE SURGERY Bilateral    cataract removal   REVERSE SHOULDER ARTHROPLASTY Right 05/18/2018   Procedure: REVERSE RIGHT SHOULDER ARTHROPLASTY;  Surgeon: Netta Cedars, MD;  Location: [place];  Service: Orthopedics;  Laterality: Right;    OB History   No obstetric history on file.      Home Medications    Prior to Admission  medications   Medication Sig Start Date End Date Taking? Authorizing Provider  acetaminophen (TYLENOL) 500 MG tablet Take 500 mg by mouth 2 (two) times daily as needed for moderate pain or headache.     Parrett, Tammy S, NP  acetaminophen (TYLENOL) 650 MG CR tablet Take 650 mg by mouth daily as needed for pain.    [provider]  amLODipine (NORVASC) 5 MG tablet Take 5 mg by mouth daily.    [provider]  aspirin 81 MG tablet Take 81 mg by mouth every morning.     [provider]  atorvastatin (LIPITOR) 40 MG tablet Take 40 mg by mouth every evening.    [provider]  cholecalciferol (VITAMIN D) 1000 units tablet Take 2,000 Units by mouth daily.    [provider]  Cyanocobalamin (B-12) 2500 MCG TABS Take 2,500 mcg by mouth daily.    [provider]  diclofenac sodium (VOLTAREN) 1 % GEL Apply 2 g topically 3 (three) times daily as needed. Patient taking differently: Apply 2 g topically 3 (three) times daily as needed (pain).  10/16/14   Hoyt Koch, MD  glucose blood (PRODIGY TEST) test strip Ck blood sugars once daily or as instructed 04/23/12   Tanda Rockers, MD  guaiFENesin (MUCINEX) 600 MG 12 hr tablet Take 600 mg by mouth 2 (two) times daily as needed (congestion).  [provider]  guaiFENesin-dextromethorphan (ROBITUSSIN DM) 100-10 MG/5ML syrup Take 20 mLs by mouth at bedtime as needed for cough.    [provider]  HYDROcodone-acetaminophen (NORCO) 5-325 MG tablet Take 1 tablet by mouth every 6 (six) hours as needed for moderate pain. 05/18/18   Netta Cedars, MD  insulin detemir (LEVEMIR) 100 UNIT/ML injection Inject 35 Units into the skin 2 (two) times daily.    [provider]  Insulin NPH, Human,, Isophane, (HUMULIN N KWIKPEN) 100 UNIT/ML Kiwkpen Inject 53 Units into the skin every morning. And pen needles 1/day Patient not taking: Reported on 05/10/2018 09/28/16   Renato Shin, MD  Insulin  Pen Needle (PEN NEEDLES) 31G X 5 MM MISC 2 each by Does not apply route daily. 05/04/16   Renato Shin, MD  latanoprost (XALATAN) 0.005 % ophthalmic solution Place 1 drop into both eyes daily. 04/20/18   [provider]  Magnesium 250 MG TABS Take 250 mg by mouth at bedtime.     [provider]  magnesium hydroxide (MILK OF MAGNESIA) 400 MG/5ML suspension Per bottle as needed for constipation Patient taking differently: Take 30 mLs by mouth daily as needed for mild constipation or moderate constipation.  08/02/11   Parrett, Fonnie Mu, NP  olmesartan (BENICAR) 40 MG tablet Take 40 mg by mouth daily. 04/16/18   [provider]  OVER THE COUNTER MEDICATION Apply 1 application topically daily as needed (shoulder pain). Penetrex otc muscle rub    [provider]  pioglitazone (ACTOS) 30 MG tablet Take 30 mg by mouth daily.    [provider]  QUEtiapine (SEROQUEL) 25 MG tablet Take 25 mg by mouth at bedtime as needed for sleep. 04/10/18   [provider]  sertraline (ZOLOFT) 100 MG tablet TAKE 1 TABLET BY MOUTH DAILY 04/11/16   Hoyt Koch, MD  sitaGLIPtin (JANUVIA) 100 MG tablet Take 100 mg by mouth daily.    [provider]    Family History Family History  Problem Relation Age of Onset   Hypertension Mother    Diabetes Mother    Diabetes Sister    Heart disease Sister    Heart disease Father    Diabetes Brother    Diabetes Daughter    Diabetes Son    Diabetes Daughter    Diabetes Son     Social History Social History   Tobacco Use   Smoking status: Former    Packs/day: 0.30    Years: 20.00    Pack years: 6.00    Types: Cigarettes    Quit date: 09/05/1985    Years since quitting: 36.1   Smokeless tobacco: Never  Vaping Use   Vaping Use: Never used  Substance Use Topics   Alcohol use: No   Drug use: No     Allergies   Albuterol and Doxycycline   Review of Systems Review of Systems Per HPI  Physical  Exam Triage Vital Signs ED Triage Vitals [10/30/21 1102]  Enc Vitals Group     BP (!) 157/76     Pulse Rate 78     Resp 18     Temp 97.7 F (36.5 C)     Temp Source Oral     SpO2 95 %     Weight      Height      Head Circumference      Peak Flow      Pain Score 0     Pain Loc  Pain Edu?      Excl. in [place]?    No data found.  Updated Vital Signs BP (!) 157/76 (BP Location: Right Arm)    Pulse 78    Temp 97.7 F (36.5 C) (Oral)    Resp 18    SpO2 95%   Visual Acuity Right Eye Distance:   Left Eye Distance:   Bilateral Distance:    Right Eye Near:   Left Eye Near:    Bilateral Near:     Physical Exam Constitutional:      General: She is not in acute distress.    Appearance: Normal appearance. She is not toxic-appearing or diaphoretic.  HENT:     Head: Normocephalic and atraumatic.  Eyes:     Extraocular Movements: Extraocular movements intact.     Conjunctiva/sclera: Conjunctivae normal.  Pulmonary:     Effort: Pulmonary effort is normal.  Musculoskeletal:     Right forearm: Normal.     Left forearm: Swelling, tenderness and bony tenderness present.     Right wrist: Normal.     Left wrist: Swelling, tenderness and bony tenderness present. No snuff box tenderness or crepitus. Decreased range of motion. Normal pulse.     Comments: Tenderness to palpation generalized throughout lower left forearm and throughout the left wrist.  Patient also has generalized swelling to both areas.  Limited range of motion of wrist.  Grip strength is 3/5.  No tenderness to palpation to left hand.  Radial pulses normal.  Capillary refill normal.  Neurological:     General: No focal deficit present.     Mental Status: She is alert and oriented to person, place, and time. Mental status is at baseline.  Psychiatric:        Mood and Affect: Mood normal.        Behavior: Behavior normal.        Thought Content: Thought content normal.        Judgment: Judgment normal.     UC  Treatments / Results  Labs (all labs ordered are listed, but only abnormal results are displayed) Labs Reviewed - No data to display  EKG   Radiology DG Forearm Left  Result Date: 10/30/2021 CLINICAL DATA:  [place] backwards at home trying to get the car. EXAM: LEFT FOREARM - 2 VIEW COMPARISON:  10/30/2021 wrist FINDINGS: There are acute fractures of the distal radius and ulnar, both associated with dorsal angulation. Ulnar styloid fracture is present. Proximal aspect of the radius and ulna are. Degenerative changes are seen in the wrist. IMPRESSION: Acute fractures of the radius and ulna. Ulnar styloid process fracture. Electronically Signed   By: Nolon Nations M.D.   On: 10/30/2021 11:55   DG Wrist Complete Left  Result Date: 10/30/2021 CLINICAL DATA:  Fall.  Injury. EXAM: LEFT WRIST - COMPLETE 3+ VIEW COMPARISON:  None. FINDINGS: There is an acute fracture of the distal radius, with dorsal angulation. Fracture appears comminuted. There is an acute fracture of the distal ulna with dorsal angulation. Ulnar styloid fracture is also present. There are degenerative changes in the first carpometacarpal joint. Bones are osteopenic. Soft tissue deformity of the wrist due to fracture. IMPRESSION: Acute fractures of the distal radius and ulnar associated dorsal angulation. Ulnar styloid fracture. Electronically Signed   By: Nolon Nations M.D.   On: 10/30/2021 11:54    Procedures Procedures (including critical care time)  Medications Ordered in UC Medications - No data to display  Initial Impression / Assessment and Plan /  UC Course  I have reviewed the triage vital signs and the nursing notes.  Pertinent labs & imaging results that were available during my care of the patient were reviewed by me and considered in my medical decision making (see chart for details).     X-ray showing displaced and angulated ulna and radial distal fractures. Dr. Lenon Curt with hand specialty was called to consult  given x-ray result.  He advised that it needs to be reduced as soon as possible and splinted.  He advised that she go to the ER for this to be completed.  Patient and family members were advised to go to the ER for further evaluation and management.  Patient was offered splinting for comfort until otherwise managed with splint but declined and wished for splinting to be completed at hospital.  Patient left via self transport to the hospital. Final Clinical Impressions(s) / UC Diagnoses   Final diagnoses:  Closed fracture of left radius and ulna, initial encounter     Discharge Instructions      Please go to [facility] as soon as you leave urgent care for further evaluation and management.     ED Prescriptions   None    PDMP not reviewed this encounter.   Teodora Medici, [place] 10/30/21 1225

## 2021-10-30 NOTE — ED Triage Notes (Signed)
Pt c/o falling backwards today at home while trying to get into the vehicle. Left lower arm appears disfigured and edematous. Pt denies loss of sensation. Is able to move fingers but limited strength and limited ROM.

## 2021-10-30 NOTE — ED Provider Notes (Signed)
[facility] EMERGENCY DEPARTMENT Provider Note   CSN: [id number] Arrival date & time: 10/30/21  1259     History  Chief Complaint  Patient presents with   Arm Injury    [name] is a 88 y.o. female.  Patient presents to ER chief complaint of fall.  She states that she was turning into her car when she fell backwards onto her left arm outstretched.  She felt sharp pain at the left wrist and presents here.  Denies head injury or headache or neck pain or back pain.  Denies loss of consciousness.  No reports of fevers or cough or vomiting or diarrhea.      Home Medications Prior to Admission medications   Medication Sig Start Date End Date Taking? Authorizing Provider  acetaminophen (TYLENOL) 500 MG tablet Take 500 mg by mouth 2 (two) times daily as needed for moderate pain or headache.     Parrett, Tammy S, NP  acetaminophen (TYLENOL) 650 MG CR tablet Take 650 mg by mouth daily as needed for pain.    [provider]  amLODipine (NORVASC) 5 MG tablet Take 5 mg by mouth daily.    [provider]  aspirin 81 MG tablet Take 81 mg by mouth every morning.     [provider]  atorvastatin (LIPITOR) 40 MG tablet Take 40 mg by mouth every evening.    [provider]  cholecalciferol (VITAMIN D) 1000 units tablet Take 2,000 Units by mouth daily.    [provider]  Cyanocobalamin (B-12) 2500 MCG TABS Take 2,500 mcg by mouth daily.    [provider]  diclofenac sodium (VOLTAREN) 1 % GEL Apply 2 g topically 3 (three) times daily as needed. Patient taking differently: Apply 2 g topically 3 (three) times daily as needed (pain).  10/16/14   Hoyt Koch, MD  glucose blood (PRODIGY TEST) test strip Ck blood sugars once daily or as instructed 04/23/12   Tanda Rockers, MD  guaiFENesin (MUCINEX) 600 MG 12 hr tablet Take 600 mg by mouth 2 (two) times daily as needed (congestion).    [provider]   guaiFENesin-dextromethorphan (ROBITUSSIN DM) 100-10 MG/5ML syrup Take 20 mLs by mouth at bedtime as needed for cough.    [provider]  HYDROcodone-acetaminophen (NORCO) 5-325 MG tablet Take 1 tablet by mouth every 6 (six) hours as needed for moderate pain. 05/18/18   Netta Cedars, MD  insulin detemir (LEVEMIR) 100 UNIT/ML injection Inject 35 Units into the skin 2 (two) times daily.    [provider]  Insulin NPH, Human,, Isophane, (HUMULIN N KWIKPEN) 100 UNIT/ML Kiwkpen Inject 53 Units into the skin every morning. And pen needles 1/day Patient not taking: Reported on 05/10/2018 09/28/16   Renato Shin, MD  Insulin Pen Needle (PEN NEEDLES) 31G X 5 MM MISC 2 each by Does not apply route daily. 05/04/16   Renato Shin, MD  latanoprost (XALATAN) 0.005 % ophthalmic solution Place 1 drop into both eyes daily. 04/20/18   [provider]  Magnesium 250 MG TABS Take 250 mg by mouth at bedtime.     [provider]  magnesium hydroxide (MILK OF MAGNESIA) 400 MG/5ML suspension Per bottle as needed for constipation Patient taking differently: Take 30 mLs by mouth daily as needed for mild constipation or moderate constipation.  08/02/11   Parrett, Fonnie Mu, NP  olmesartan (BENICAR) 40 MG tablet Take 40 mg by mouth daily. 04/16/18   [provider]  OVER THE COUNTER MEDICATION Apply 1 application topically daily as needed (shoulder pain). Penetrex otc muscle rub    [provider]  pioglitazone (ACTOS) 30 MG tablet Take 30 mg by mouth daily.    [provider]  QUEtiapine (SEROQUEL) 25 MG tablet Take 25 mg by mouth at bedtime as needed for sleep. 04/10/18   [provider]  sertraline (ZOLOFT) 100 MG tablet TAKE 1 TABLET BY MOUTH DAILY 04/11/16   Hoyt Koch, MD  sitaGLIPtin (JANUVIA) 100 MG tablet Take 100 mg by mouth daily.    [provider]      Allergies    Albuterol and Doxycycline    Review of Systems   Review of  Systems  Constitutional:  Negative for fever.  HENT:  Negative for ear pain.   Eyes:  Negative for pain.  Respiratory:  Negative for cough.   Cardiovascular:  Negative for chest pain.  Gastrointestinal:  Negative for abdominal pain.  Genitourinary:  Negative for flank pain.  Musculoskeletal:  Negative for back pain.  Skin:  Negative for rash.  Neurological:  Negative for headaches.   Physical Exam Updated Vital Signs BP (!) 163/88    Pulse 85    Temp 97.9 F (36.6 C)    Resp 16    SpO2 96%  Physical Exam Constitutional:      General: She is not in acute distress.    Appearance: Normal appearance.  HENT:     Head: Normocephalic.     Nose: Nose normal.  Eyes:     Extraocular Movements: Extraocular movements intact.  Cardiovascular:     Rate and Rhythm: Normal rate.  Pulmonary:     Effort: Pulmonary effort is normal.  Musculoskeletal:     Cervical back: Normal range of motion.     Comments: Positive deformity left upper extremity.  Otherwise neurovascularly intact.  Compartments are soft.  Neurological:     General: No focal deficit present.     Mental Status: She is alert. Mental status is at baseline.    ED Results / Procedures / Treatments   Labs (all labs ordered are listed, but only abnormal results are displayed) Labs Reviewed  CBC WITH DIFFERENTIAL/PLATELET - Abnormal; Notable for the following components:      Result Value   RDW 17.5 (*)    Platelets 147 (*)    All other components within normal limits  BASIC METABOLIC PANEL - Abnormal; Notable for the following components:   Glucose, Bld 164 (*)    BUN 29 (*)    Creatinine, Ser 1.49 (*)    Calcium 10.8 (*)    GFR, Estimated 33 (*)    All other components within normal limits    EKG None  Radiology DG Forearm Left  Result Date: 10/30/2021 CLINICAL DATA:  [place] [address] backwards at home trying to get the car. EXAM: LEFT FOREARM - 2 VIEW COMPARISON:  10/30/2021 wrist FINDINGS: There are acute fractures of the  distal radius and ulnar, both associated with dorsal angulation. Ulnar styloid fracture is present. Proximal aspect of the radius and ulna are. Degenerative changes are seen in the wrist. IMPRESSION: Acute fractures of the radius and ulna. Ulnar styloid process fracture. Electronically Signed   By: Nolon Nations M.D.   On: 10/30/2021 11:55   DG Wrist 2 Views Left  Result Date: 10/30/2021 CLINICAL DATA:  Fracture distal radius and ulna EXAM: LEFT WRIST - 2 VIEW COMPARISON:  Previous study done earlier today FINDINGS: There is interval  improvement in alignment of fracture fragments in the left distal radius. Dorsal displacement of distal fracture fragments in the radius is less evident. Fracture line appears to extend to the articular surface. There is comminuted fracture in the distal shaft of ulna. Overlying plaster splint limits evaluation. Marked degenerative changes are noted in first carpometacarpal joint. IMPRESSION: There is improvement in alignment of fracture fragments in the distal radius after reduction. Other findings as described in the body of the report. Electronically Signed   By: Elmer Picker M.D.   On: 10/30/2021 14:46   DG Wrist Complete Left  Result Date: 10/30/2021 CLINICAL DATA:  Fall.  Injury. EXAM: LEFT WRIST - COMPLETE 3+ VIEW COMPARISON:  None. FINDINGS: There is an acute fracture of the distal radius, with dorsal angulation. Fracture appears comminuted. There is an acute fracture of the distal ulna with dorsal angulation. Ulnar styloid fracture is also present. There are degenerative changes in the first carpometacarpal joint. Bones are osteopenic. Soft tissue deformity of the wrist due to fracture. IMPRESSION: Acute fractures of the distal radius and ulnar associated dorsal angulation. Ulnar styloid fracture. Electronically Signed   By: Nolon Nations M.D.   On: 10/30/2021 11:54    Procedures .Ortho Injury Treatment  Date/Time: 10/30/2021 3:55 PM Performed by:  Luna Fuse, MD Authorized by: Luna Fuse, MD  Post-procedure neurovascular assessment: post-procedure neurovascularly intact Comments: Hematoma block and reduction of distal radius distal ulnar fracture of the left upper extremity.  Placed in a sugar-tong splint afterwards, neurovascularly intact.      Medications Ordered in ED Medications  lidocaine (PF) (XYLOCAINE) 1 % injection 10 mL (10 mLs Infiltration Given by Other 10/30/21 1334)  morphine (PF) 2 MG/ML injection 2 mg (2 mg Intravenous Given 10/30/21 1332)  ondansetron (ZOFRAN-ODT) disintegrating tablet 4 mg (4 mg Oral Given 10/30/21 1332)  amLODipine (NORVASC) tablet 10 mg (10 mg Oral Given 10/30/21 1354)  morphine (PF) 4 MG/ML injection 4 mg (4 mg Intravenous Given 10/30/21 1440)  hydrALAZINE (APRESOLINE) tablet 50 mg (50 mg Oral Given 10/30/21 1551)    ED Course/ Medical Decision Making/ A&P                           Medical Decision Making Amount and/or Complexity of Data Reviewed Labs: ordered. Radiology: ordered.  Risk Prescription drug management.   Chart review shows urgent care visit earlier today with angulated fracture of the left wrist both distal radius and distal ulna.  Hematoma block performed here.  Site prepped with chlorhexidine x3.  Lidocaine 1% total of 5 cc injected into the radial fracture and 5 cc injected into the ulnar fracture.  No active bleeding seen after injections with a 21-gauge needle.  Lateral traction and reduction of the left wrist performed by myself.  Placed in a sugar-tong splint and neurovascular tact after placement.  Postreduction x-ray shows better alignment of the fracture.  Patient neurovascular intact.  Advising outpatient orthopedic follow-up within the week.  Advising immediate return for worsening symptoms fevers pain or any additional concerns.  Blood pressure initially was very high at 208/66.  Repeat blood pressures after pain medication continue to be high.  At this  point she was treated with blood pressure medications with significant improvement.  Last blood pressure was in the 160s over 70s range.  Discharged home to follow-up with orthopedic surgery within the week.  Advised return for worsening symptoms or any additional concerns.  Final Clinical Impression(s) / ED Diagnoses Final diagnoses:  Fracture  Closed fracture of left wrist, initial encounter    Rx / DC Orders ED Discharge Orders     None         Luna Fuse, MD 10/30/21 [phone]

## 2021-10-30 NOTE — Discharge Instructions (Signed)
Please go to [facility] as soon as you leave urgent care for further evaluation and management.

## 2021-10-30 NOTE — Progress Notes (Signed)
Orthopedic Tech Progress Note Patient Details:  [name] May 14, 1929 [id number]  Ortho Devices Type of Ortho Device: Sugartong splint, Arm sling Ortho Device/Splint Location: LUE Ortho Device/Splint Interventions: Application   Post Interventions Patient Tolerated: Well Instructions Provided: Care of device  Aqsa Sensabaugh E Demarkus Remmel 10/30/2021, 2:41 PM
